# Patient Record
Sex: Male | Born: 1955
Health system: Southern US, Community
[De-identification: ages and names within clinical notes are randomized; demographics above are authoritative.]

## PROBLEM LIST (undated history)

## (undated) DIAGNOSIS — I10 Essential (primary) hypertension: Secondary | ICD-10-CM

## (undated) DIAGNOSIS — M1990 Unspecified osteoarthritis, unspecified site: Secondary | ICD-10-CM

## (undated) DIAGNOSIS — J302 Other seasonal allergic rhinitis: Secondary | ICD-10-CM

## (undated) DIAGNOSIS — E039 Hypothyroidism, unspecified: Secondary | ICD-10-CM

## (undated) DIAGNOSIS — L309 Dermatitis, unspecified: Secondary | ICD-10-CM

## (undated) DIAGNOSIS — C801 Malignant (primary) neoplasm, unspecified: Secondary | ICD-10-CM

## (undated) HISTORY — PX: TONSILLECTOMY: SUR1361

## (undated) HISTORY — DX: Dermatitis, unspecified: L30.9

## (undated) HISTORY — PX: OTHER SURGICAL HISTORY: SHX169

## (undated) HISTORY — PX: THYROIDECTOMY: SHX17

## (undated) HISTORY — PX: ADENOIDECTOMY: SUR15

## (undated) HISTORY — DX: Unspecified osteoarthritis, unspecified site: M19.90

## (undated) HISTORY — DX: Malignant (primary) neoplasm, unspecified: C80.1

## (undated) HISTORY — PX: HERNIA REPAIR: SHX51

---

## 1998-08-08 ENCOUNTER — Ambulatory Visit (HOSPITAL_BASED_OUTPATIENT_CLINIC_OR_DEPARTMENT_OTHER): Admission: RE | Admit: 1998-08-08 | Discharge: 1998-08-08 | Payer: Self-pay | Admitting: General Surgery

## 1999-10-14 ENCOUNTER — Emergency Department (HOSPITAL_COMMUNITY): Admission: EM | Admit: 1999-10-14 | Discharge: 1999-10-14 | Payer: Self-pay | Admitting: Podiatry

## 2001-07-31 ENCOUNTER — Ambulatory Visit (HOSPITAL_COMMUNITY): Admission: RE | Admit: 2001-07-31 | Discharge: 2001-07-31 | Payer: Self-pay | Admitting: Gastroenterology

## 2003-01-25 ENCOUNTER — Ambulatory Visit (HOSPITAL_COMMUNITY): Admission: RE | Admit: 2003-01-25 | Discharge: 2003-01-25 | Payer: Self-pay | Admitting: Family Medicine

## 2005-04-26 ENCOUNTER — Encounter: Admission: RE | Admit: 2005-04-26 | Discharge: 2005-04-26 | Payer: Self-pay | Admitting: Emergency Medicine

## 2005-04-26 ENCOUNTER — Encounter (INDEPENDENT_AMBULATORY_CARE_PROVIDER_SITE_OTHER): Payer: Self-pay | Admitting: Specialist

## 2005-04-26 ENCOUNTER — Other Ambulatory Visit: Admission: RE | Admit: 2005-04-26 | Discharge: 2005-04-26 | Payer: Self-pay | Admitting: Interventional Radiology

## 2005-08-02 ENCOUNTER — Encounter (INDEPENDENT_AMBULATORY_CARE_PROVIDER_SITE_OTHER): Payer: Self-pay | Admitting: *Deleted

## 2005-08-02 ENCOUNTER — Ambulatory Visit (HOSPITAL_COMMUNITY): Admission: RE | Admit: 2005-08-02 | Discharge: 2005-08-03 | Payer: Self-pay | Admitting: Surgery

## 2005-09-23 ENCOUNTER — Ambulatory Visit (HOSPITAL_COMMUNITY): Admission: RE | Admit: 2005-09-23 | Discharge: 2005-09-23 | Payer: Self-pay | Admitting: Surgery

## 2005-10-29 ENCOUNTER — Encounter (INDEPENDENT_AMBULATORY_CARE_PROVIDER_SITE_OTHER): Payer: Self-pay | Admitting: Specialist

## 2005-10-29 ENCOUNTER — Ambulatory Visit (HOSPITAL_COMMUNITY): Admission: RE | Admit: 2005-10-29 | Discharge: 2005-10-30 | Payer: Self-pay | Admitting: Surgery

## 2005-11-24 ENCOUNTER — Emergency Department (HOSPITAL_COMMUNITY): Admission: EM | Admit: 2005-11-24 | Discharge: 2005-11-24 | Payer: Self-pay | Admitting: Family Medicine

## 2006-01-03 ENCOUNTER — Encounter: Admission: RE | Admit: 2006-01-03 | Discharge: 2006-01-03 | Payer: Self-pay | Admitting: Endocrinology

## 2006-01-11 ENCOUNTER — Encounter: Admission: RE | Admit: 2006-01-11 | Discharge: 2006-01-11 | Payer: Self-pay | Admitting: Endocrinology

## 2006-01-24 ENCOUNTER — Emergency Department (HOSPITAL_COMMUNITY): Admission: EM | Admit: 2006-01-24 | Discharge: 2006-01-24 | Payer: Self-pay | Admitting: Family Medicine

## 2006-03-01 ENCOUNTER — Ambulatory Visit (HOSPITAL_COMMUNITY): Admission: RE | Admit: 2006-03-01 | Discharge: 2006-03-01 | Payer: Self-pay | Admitting: Gastroenterology

## 2006-08-13 ENCOUNTER — Emergency Department (HOSPITAL_COMMUNITY): Admission: EM | Admit: 2006-08-13 | Discharge: 2006-08-13 | Payer: Self-pay | Admitting: Family Medicine

## 2007-05-22 ENCOUNTER — Emergency Department (HOSPITAL_COMMUNITY): Admission: EM | Admit: 2007-05-22 | Discharge: 2007-05-22 | Payer: Self-pay | Admitting: Emergency Medicine

## 2010-07-31 NOTE — Procedures (Signed)
[facility]  Patient:    Jerry Freeman, Jerry Freeman Visit Number: [id number] MRN: [id number]          Service Type: END Location: ENDO Attending Physician:  Dennison Bulla Ii Dictated by:   Verlin Grills, M.D. Proc. Date: 07/31/01 Admit Date:  07/31/2001   CC:         Jamesetta Geralds, M.D.   Procedure Report  PROCEDURE:  Screening colonoscopy.  REFERRING PHYSICIAN:  Jamesetta Geralds, M.D.  PROCEDURE INDICATION:  Jerry Freeman is a 55 year old male, born 12/01/1955.  Jerry Freeman father was diagnosed with rectal cancer when his father was in his early 18s.  Jerry Freeman is referred for his first screening colonoscopy with polypectomy to prevent colon cancer.  ENDOSCOPIST:  Verlin Grills, M.D.  PREMEDICATION:  Versed 10 mg, Demerol 50 mg.  ENDOSCOPE:  Olympus pediatric colonoscope.  DESCRIPTION OF PROCEDURE:  After obtaining informed consent, Mr. Jerry Freeman was placed in the left lateral decubitus position.  I administered intravenous Demerol and intravenous Versed to achieve conscious sedation for the procedure.  The patients blood pressure, oxygen saturation, and cardiac rhythm were monitored throughout the procedure and documented in the medical record.  Anal inspection was normal.  Digital rectal exam was normal.  The Olympus pediatric video colonoscope was introduced into the rectum and advanced to the cecum.  A normal-appearing ileocecal valve was intubated and the distal ileum inspected.  Colonic preparation for the exam today was excellent.  RECTUM:  Normal.  SIGMOID COLON AND DESCENDING COLON:  Normal.  SPLENIC FLEXURE:  Normal.  TRANSVERSE COLON:  Normal.  HEPATIC FLEXURE:  Normal.  ASCENDING COLON:  Normal.  CECUM AND ILEOCECAL VALVE:  Normal.  DISTAL ILEUM:  Normal.  ASSESSMENT: 1. Normal screening proctocolonoscopy to the cecum. 2. No endoscopic evidence for the presence of colorectal neoplasia.  RECOMMENDATIONS:  Repeat  colonoscopy in approximately five years. Dictated by:   Verlin Grills, M.D. Attending Physician:  Dennison Bulla Ii DD:  07/31/01 TD:  07/31/01 Job: [id number]

## 2010-07-31 NOTE — Op Note (Signed)
NAME:  Jerry Freeman, Jerry Freeman NO.:  [id number]   MEDICAL RECORD NO.:  [id number]          PATIENT TYPE:  OIB   LOCATION:  1432                         FACILITY:  [facility]   PHYSICIAN:  Thomas A. Cornett, M.D.DATE OF BIRTH:  [birth year]   DATE OF PROCEDURE:  08/02/2005  DATE OF DISCHARGE:                                 OPERATIVE REPORT   PREOPERATIVE DIAGNOSIS:  Right thyroid follicular lesion.   POSTOPERATIVE DIAGNOSIS:  Right thyroid follicular lesion.   PROCEDURE:  Right thyroid lobectomy and isthmusectomy.   SURGEON:  Dr. Harriette Bouillon.   ASSISTANT:  Dr. Consuello Bossier.   ANESTHESIA:  General endotracheal anesthesia.   ESTIMATED BLOOD LOSS:  100 mL.   SPECIMEN:  Right thyroid lobe and isthmus to pathology.  Frozen section  reveals a follicular lesion without obvious capsular invasion.   DRAINS:  None.   INDICATIONS FOR PROCEDURE:  The patient is a 55 year old male with an  enlarging right thyroid nodule.  FNA showed it to be a follicular lesion  with Hurtle cell changes  but it was over 4 cm in maximal diameter.  Given  its large size and his male gender, I recommended a lobectomy for definitive  diagnosis, since these are risk factors.  We also talked about observation,  which in his case, I am uncomfortable doing and recommended excision in this  setting, given its large size.  He agrees and wished to proceed.  The  procedure was discussed, as well as the potential complications.   DESCRIPTION OF PROCEDURE:  The patient was brought to the operating room and  placed supine.  Both arms were tucked and the patient's head was placed in  an extended position after initiation of general endotracheal anesthesia.  The neck was prepped and draped in a sterile fashion.  An incision was made  two fingerbreadths above the sternal notch.  The incision was made through  the skin, down through the platysma muscle.  Superior inferior flaps were  created, thus exposing  the strap muscles.  I divided the strap muscles in  the midline and retracted them to the patient's right.  The patient had a  very large right thyroid lobe with a large nodule in the inferior portion of  it.  We began our dissection on the superior lobe.  I was able to dissect  out the superior thyroid pedicle with the superior thyroid vessels.  These  were dissected and individually tied and clipped.  Care was taken to stay  right on the thyroid gland here.  Once this was done, I used __________ to  push away the soft areolar tissue and take this down toward the inferior  pole.  The lobe was quite difficult to manipulate, given its large size.  There was some bleeding, mostly from the gland itself, during the procedure.  I was able to mobilize the inferior lobe as well.  We were able to dissect  down toward the inferior thyroid vessels and identify these.  These were  tied off with a combination of 3-0 Vicryl ties, as well as clips.  The  inferior thyroid gland was identified and pushed away from the field of  dissection.  The recurrent laryngeal nerve on the right was identified and  care was taken to stay away from this as well.  The superior right  parathyroid gland was not identified.  Once we took the inferior gland, we  were able to take the small vessels with a combination of small clips and  ties.  We then rolled the gland away from the nerve.  The ligament of Allyson Sabal  was then identified.  I placed a Hemostat across this and divided the gland  just above it to expose the trachea.  This was tied off with a 3-0 Vicryl  tie.  There was a very large vein on the inferior portion of the thyroid  gland that I took with 3-0 Vicryl ties.  Once I was able to do this, I was  able to pull the gland and dissect it off the trachea using cautery.  The  isthmus was also taken with it as well.  I then placed a Kelly clamp on the  left side of the isthmus to include the entire isthmus and divided the  gland  with cautery.  I oversewed this with a 3-0 Vicryl stick tie.  The specimen  was sent to pathology.  Frozen section revealed this to be a large  follicular lesion with no obvious signs of capsular invasion.  At this point  in time, since there were no obvious malignant signs, I did not feel a left  lobectomy was warranted.  I palpated the lobe and felt no obvious signs of  disease in his left lobe.  There were no obvious enlarged lymph nodes,  either in the central compartment or on either side that I could feel.  I  reexamined the operative bed.  There was some oozing coming from the  operative bed and I irrigated this out and Surgicel was placed and pressure  was held for 5 minutes.  After this, it was reinspected and hemostasis was  excellent.  I irrigated again and examined the bed very carefully and saw no  signs of bleeding at this point in time.  Clips and ties were on the  appropriate vessels.  The nerve was re-identified and found to be unharmed  upon additional inspection.  I could identify what appeared to be the  inferior parathyroid gland again.  It appeared to be viable.  I did not  identify the superior parathyroid gland though.  At this point in time, the  old Surgicel was removed and a new piece was placed.  I closed the muscles  in the midline using 3-0 Vicryl suture.  I closed the platysma with 3-0  Vicryl pop offs and 4-0 Monocryl was used to close the skin incision.  A  sterile dressing was applied.  All final counts of sponge, needle, and  instruments were found to be correct.  The patient was awoke after placement  of a sterile dressing and taken to recovery in satisfactory condition.      Thomas A. Cornett, M.D.  Electronically Signed     TAC/MEDQ  D:  08/02/2005  T:  08/02/2005  Job:  [id number]   cc:   Jocelyn Lamer D. Pecola Leisure, M.D.  Fax: [phone]   Stan Head. Cleta Alberts, M.D.  Fax: [phone]

## 2010-07-31 NOTE — Op Note (Signed)
NAME:  Jerry Freeman, Jerry Freeman NO.:  [id number]   MEDICAL RECORD NO.:  [id number]          PATIENT TYPE:  AMB   LOCATION:  DAY                          FACILITY:  [facility]   PHYSICIAN:  Thomas A. Cornett, M.D.DATE OF BIRTH:  14-Jul-1955   DATE OF PROCEDURE:  10/29/2005  DATE OF DISCHARGE:                                 OPERATIVE REPORT   PREOPERATIVE DIAGNOSIS:  Follicular carcinoma of the thyroid.   POSTOPERATIVE DIAGNOSIS:  Follicular carcinoma of the thyroid.   PROCEDURES:  1. Completion thyroidectomy.  2. Reimplantation of left inferior parathyroid gland.   SURGEON:  Harriette Bouillon, MD.   ANESTHESIA:  General endotracheal anesthesia.   ASSISTANT:  Dr. Lebron Conners.   DRAINS:  None.   SPECIMEN:  1. Left thyroid lobe and pyramidal lobe.  2. Fragment of left inferior parathyroid verified by frozen section.   INDICATIONS FOR PROCEDURE:  The patient is a 55 year old male who was found  to have a 3.5 cm right thyroid lobe follicular carcinoma Hurthle cell  characteristics on previous right thyroid lobectomy.  He was brought back  for a completion thyroidectomy.  His postoperative course was complicated by  the patient eating prior to his last scheduled surgery and having to  reschedule this again.  He is here today to undergo completion  thyroidectomy.  Procedure discussed as well as potential risks.  He voiced  understanding and he agreed to proceed.   DESCRIPTION OF PROCEDURE:  The patient was brought to the operating room,  placed supine.  Both arms were tucked and his head was extended.  After  general anesthesia, the neck was prepped and draped in sterile fashion.  I  made an incision through his previous scar and dissection was carried down  to identify the scarred remnants of the platysmal muscle.  I was able to  open this and create a superior and inferior flap quite easily given the  amount of scar tissue.  Once I did this, I identified the sternal  hyoid  muscles and was able to transect in between the two muscles in the midline.  I was then able to bluntly dissect the left thyroid lobe away from the  underlying omohyoid and sternohyoid muscles.  Also of note, there is a large  pyramidal lobe that was noted.  Attention was turned to the superior pole.  The superior pole was very high in this patient.  I was able to slowly  dissect the vessels individually off the superior pole taking care to stay  right on the thyroid gland.  This appeared be very difficult and I decided  to go ahead and take the middle thyroid vein to help mobilize the left lobe  up better.  Once I did this, I was able to see better.  We preceded  inferiorly and mobilized the thyroid gland.  Of note, there is a large left  lower pole parathyroid gland that was quite anterior.  This was very high on  the gland and in trying to dissect this away from the gland, we encountered  some bleeding.  I did  not feel this gland was going to be able to be  separated from the inferior pole of the left thyroid lobe very easily and  elected to go ahead in this setting and reimplant it since it was very  difficult dissecting the inferior parathyroid gland away from the thyroid  gland.  I did not think it would be viable by leaving it, so I went ahead  and felt that reimplantation of this would be necessary.  We continued to  mobilize left thyroid lobe further.  I was then able to see the superior  pole better and we took these vessels individually between clips and 3-0  silk ties.  Once we were able to flip this up, I was able to stay on the  thyroid gland and continue to dissect the thyroid gland away from the  tracheoesophageal groove.  We identified the left recurrent laryngeal nerve  and preserve this.  Inferior thyroid vessels were also taken individually  between clips and silk ties of 3-0 caliber.  Once I was able to roll the  gland away from the tracheoesophageal groove, I  used the cautery and small  clips to dissect it off the anterior surface of the trachea.  There was a  very large pyramidal lobe and I was able to dissect up and remove the  majority of this, but I left about a 1 cm segment of the pyramidal lobe  behind and cauterized it.  I was able to then remove the remainder of the  thyroid off the anterior surface of the trachea and sent to pathology.  The  parathyroid gland was then removed from the inferior pole and I sent a small  portion for frozen section which verified it was parathyroid tissue.  The  remainder was placed in cold saline.  I then took the parathyroid gland and  cut it into very small cubes on a Telfa and then reimplanted it in a small  pocket I made in the left sternohyoid muscle and then closed with a Vicryl  stitch.  The superior parathyroid gland was not identified and was not with  the specimen.  At this point, irrigation was used and suctioned out.  There  was a small amount of oozing just anterior to the nerve on the left and I  was able to place a small clip across this without clipping the nerve.  The  remainder of the operative wound was hemostatic.  After irrigation, I dried  this out of placed Surgicel on the surgical bed.  I then closed the muscle  layers using interrupted 3-0 Vicryl pop-offs.  I closed the platysma with 3-  0 Vicryl pop-offs and the skin with 4-0 Monocryl.  All final counts of  sponge, needle and instruments were found to be correct at this portion of  the case.  Sterile dressings were applied.  The patient was awoke and taken  to recovery in satisfactory condition.      Thomas A. Cornett, M.D.  Electronically Signed     TAC/MEDQ  D:  10/29/2005  T:  10/29/2005  Job:  [id number]   cc:   Brett Canales A. Cleta Alberts, M.D.  Fax: [phone]   Betti D. Pecola Leisure, M.D.  Fax: [phone]

## 2011-03-13 ENCOUNTER — Encounter: Payer: Self-pay | Admitting: *Deleted

## 2011-03-13 ENCOUNTER — Emergency Department (INDEPENDENT_AMBULATORY_CARE_PROVIDER_SITE_OTHER)
Admission: EM | Admit: 2011-03-13 | Discharge: 2011-03-13 | Disposition: A | Discharge status: Home / Self Care | Payer: BC Managed Care – PPO | Source: Home / Self Care | Attending: Family Medicine | Admitting: Family Medicine

## 2011-03-13 DIAGNOSIS — R05 Cough: Secondary | ICD-10-CM

## 2011-03-13 HISTORY — DX: Other seasonal allergic rhinitis: J30.2

## 2011-03-13 HISTORY — DX: Hypothyroidism, unspecified: E03.9

## 2011-03-13 MED ORDER — ACETAMINOPHEN-CODEINE 120-12 MG/5ML PO SUSP: 5.0000 mL | Freq: Four times a day (QID) | ORAL | Status: AC | PRN

## 2011-03-13 MED ORDER — OMEPRAZOLE 20 MG PO CPDR: 20.0000 mg | DELAYED_RELEASE_CAPSULE | Freq: Every day | ORAL | Status: DC

## 2011-03-13 MED ORDER — FEXOFENADINE-PSEUDOEPHED ER 60-120 MG PO TB12: 1.0000 | ORAL_TABLET | Freq: Two times a day (BID) | ORAL | Status: DC

## 2011-03-13 MED ORDER — PREDNISONE 20 MG PO TABS: ORAL_TABLET | ORAL | Status: AC

## 2011-03-13 NOTE — ED Provider Notes (Signed)
History     CSN: [id number]  Arrival date & time 03/13/11  9147   First MD Initiated Contact with Patient 03/13/11 [phone]      Chief Complaint  Patient presents with  . Cough  . Nasal Congestion  . Nausea    (Consider location/radiation/quality/duration/timing/severity/associated sxs/prior treatment) HPI Comments: 55 y/o male h/o hypothyroidism post thyroidectomy here c/o persistent non productive cough, altough some times might see clear sputum only in am after waking up from bed. Reports had cold like symptoms that started about 10 days ago, sore throat and congestion resolved but has persistent cough that makes him nauseous some times. Denies chest pain or shortness of breath or wheezing. No fever or chills. Has taken cough syrup with no improvement. No vomiting or diarrhea, no headache or dizziness. No PND. No leg swelling. Non smoker, denies acid reflux.    Past Medical History  Diagnosis Date  . Hypothyroidism   . Seasonal allergies     Past Surgical History  Procedure Date  . Hernia repair   . Thyroidectomy     History reviewed. No pertinent family history.  History  Substance Use Topics  . Smoking status: Never Smoker   . Smokeless tobacco: Not on file  . Alcohol Use: No      Review of Systems  Constitutional: Negative.  Negative for fever, chills, appetite change and fatigue.  HENT: Negative for congestion, sore throat, rhinorrhea, trouble swallowing, neck pain, voice change and sinus pressure.   Respiratory: Positive for cough. Negative for chest tightness, shortness of breath and wheezing.   Cardiovascular: Negative for chest pain, palpitations and leg swelling.  Skin: Negative for rash.    Allergies  Review of patient's allergies indicates no known allergies.  Home Medications   Current Outpatient Rx  Name Route Sig Dispense Refill  . CETIRIZINE HCL 10 MG PO TABS Oral Take 10 mg by mouth daily.      . GUAIFENESIN ER 600 MG PO TB12 Oral Take 1,200  mg by mouth 2 (two) times daily.      Marland Kitchen LEVOTHYROXINE SODIUM 125 MCG PO TABS Oral Take 125 mcg by mouth daily.      Marland Kitchen VITAMIN C 500 MG PO TABS Oral Take 500 mg by mouth daily.      . ACETAMINOPHEN-CODEINE 120-12 MG/5ML PO SUSP Oral Take 5 mLs by mouth every 6 (six) hours as needed for pain (or cough). 60 mL 0  . OMEPRAZOLE 20 MG PO CPDR Oral Take 1 capsule (20 mg total) by mouth daily. 30 capsule 0  . PREDNISONE 20 MG PO TABS  2 tabs po daily for 5 days 10 tablet no    BP 138/94  Pulse 61  Temp(Src) 97.6 F (36.4 C) (Oral)  Resp 15  SpO2 98%  Physical Exam  Nursing note and vitals reviewed. Constitutional: He is oriented to person, place, and time. He appears well-developed and well-nourished. No distress.  HENT:  Head: Normocephalic and atraumatic.  Right Ear: Tympanic membrane normal.  Left Ear: Tympanic membrane normal.  Nose: Mucosal edema present. No rhinorrhea. Right sinus exhibits no maxillary sinus tenderness and no frontal sinus tenderness. Left sinus exhibits no maxillary sinus tenderness and no frontal sinus tenderness.  Mouth/Throat: Uvula is midline, oropharynx is clear and moist and mucous membranes are normal. No oropharyngeal exudate.  Eyes: Conjunctivae and EOM are normal. Pupils are equal, round, and reactive to light. Right eye exhibits no discharge. Left eye exhibits no discharge.  Neck: Neck supple. No  JVD present.  Cardiovascular: Normal rate and regular rhythm.  Exam reveals no gallop and no friction rub.   No murmur heard. Pulmonary/Chest: Breath sounds normal. No respiratory distress. He has no wheezes. He has no rales. He exhibits no tenderness.  Abdominal: Soft. He exhibits no distension. There is no tenderness.  Lymphadenopathy:    He has no cervical adenopathy.  Neurological: He is alert and oriented to person, place, and time.  Skin: No rash noted.    ED Course  Procedures (including critical care time)  Labs Reviewed - No data to display No  results found.   1. Persistent cough       MDM  Impress residual cough after recent URI. Clinically well afebrile. Treated with antihistamine,codein, prednisone and Prilosec. Supportive measures discussed. Asked to follow up with PCP or return if persistent or worsening or new symptoms.        Sharin Grave, MD 03/14/11 [phone]

## 2011-03-13 NOTE — ED Notes (Signed)
Pt with onset of cough/congestion 12/16 taking otc alka seltzer cold syrup without relief onset of nausea this am

## 2011-06-28 ENCOUNTER — Telehealth: Payer: Self-pay

## 2011-06-28 NOTE — Telephone Encounter (Signed)
Pt is requesting a refill on viagra, states can't come in until next month for an evaluation.  Please call pt to advise if can refill

## 2011-06-30 MED ORDER — SILDENAFIL CITRATE 50 MG PO TABS: 50.0000 mg | ORAL_TABLET | Freq: Every day | ORAL | Status: DC | PRN

## 2011-06-30 NOTE — Telephone Encounter (Signed)
Chart pulled to PA 

## 2011-06-30 NOTE — Telephone Encounter (Signed)
Rx for #10 sent to [facility].  Please advise patient that he needs OV for additional fills.

## 2011-06-30 NOTE — Telephone Encounter (Signed)
Please pull chart. Not yet seen in epic.  Jerry Freeman

## 2011-06-30 NOTE — Telephone Encounter (Signed)
Order placed in chart but do not have pharamacy listed, please get pt pharmacy to send.

## 2011-06-30 NOTE — Telephone Encounter (Signed)
Patient uses [facility] and would like rx called into there.

## 2011-06-30 NOTE — Telephone Encounter (Signed)
Called pt LMOM to cb with pharmacy

## 2011-06-30 NOTE — Telephone Encounter (Signed)
I LET PT KNOW THAT RX'S SENT TO [facility] AND HE WILL NEED OV FOR MORE.

## 2011-08-16 ENCOUNTER — Ambulatory Visit (INDEPENDENT_AMBULATORY_CARE_PROVIDER_SITE_OTHER): Payer: BC Managed Care – PPO | Admitting: Internal Medicine

## 2011-08-16 ENCOUNTER — Encounter: Payer: Self-pay | Admitting: Internal Medicine

## 2011-08-16 VITALS — BP 121/87 | HR 79 | Temp 97.0°F | Resp 18 | Ht 73.0 in | Wt 207.0 lb

## 2011-08-16 DIAGNOSIS — J301 Allergic rhinitis due to pollen: Secondary | ICD-10-CM

## 2011-08-16 DIAGNOSIS — E039 Hypothyroidism, unspecified: Secondary | ICD-10-CM

## 2011-08-16 DIAGNOSIS — N529 Male erectile dysfunction, unspecified: Secondary | ICD-10-CM

## 2011-08-16 DIAGNOSIS — Z789 Other specified health status: Secondary | ICD-10-CM

## 2011-08-16 MED ORDER — CETIRIZINE HCL 10 MG PO TABS: 10.0000 mg | ORAL_TABLET | Freq: Every day | ORAL | Status: DC

## 2011-08-16 MED ORDER — LEVOTHYROXINE SODIUM 125 MCG PO TABS: 125.0000 ug | ORAL_TABLET | Freq: Every day | ORAL | Status: DC

## 2011-08-16 MED ORDER — SILDENAFIL CITRATE 100 MG PO TABS: 100.0000 mg | ORAL_TABLET | Freq: Every day | ORAL | Status: DC | PRN

## 2011-08-16 NOTE — Progress Notes (Signed)
  Subjective:    Patient ID: Jerry Freeman, male    DOB: 03-24-1955, 56 y.o.   MRN: [id number]  HPI Low thy rxed ED needs viagra Allergys controlled   Review of Systems     Objective:   Physical Exam  Normal      Assessment & Plan:  RF meds 1 year

## 2011-08-17 LAB — TSH: TSH: 2.4 u[IU]/mL (ref 0.350–4.500)

## 2011-08-19 ENCOUNTER — Telehealth: Payer: Self-pay

## 2011-08-19 NOTE — Telephone Encounter (Signed)
Spoke to patient to advise his labs for thyroid were good and results were mailed to him already Jerry Freeman

## 2011-08-19 NOTE — Telephone Encounter (Signed)
Saw Dr. Perrin Maltese on Monday, wanting to know if lab work is back re: thyroid.  Pt has a prescripion at [facility], but need to know if the synthroid needs to be adjusted based on labwork.

## 2011-10-15 ENCOUNTER — Encounter: Payer: Self-pay | Admitting: Physician Assistant

## 2011-12-06 ENCOUNTER — Encounter: Payer: BC Managed Care – PPO | Admitting: Internal Medicine

## 2011-12-08 ENCOUNTER — Ambulatory Visit (INDEPENDENT_AMBULATORY_CARE_PROVIDER_SITE_OTHER): Payer: BC Managed Care – PPO | Admitting: Family Medicine

## 2011-12-08 ENCOUNTER — Encounter: Payer: Self-pay | Admitting: Family Medicine

## 2011-12-08 VITALS — BP 130/94 | HR 48 | Temp 97.7°F | Resp 16 | Ht 72.5 in | Wt 215.0 lb

## 2011-12-08 DIAGNOSIS — Z23 Encounter for immunization: Secondary | ICD-10-CM

## 2011-12-08 DIAGNOSIS — Z0000 Encounter for general adult medical examination without abnormal findings: Secondary | ICD-10-CM

## 2011-12-08 DIAGNOSIS — K219 Gastro-esophageal reflux disease without esophagitis: Secondary | ICD-10-CM

## 2011-12-08 DIAGNOSIS — J309 Allergic rhinitis, unspecified: Secondary | ICD-10-CM

## 2011-12-08 LAB — POCT URINALYSIS DIPSTICK
Bilirubin, UA: NEGATIVE
Blood, UA: NEGATIVE
Glucose, UA: NEGATIVE
Ketones, UA: NEGATIVE
Leukocytes, UA: NEGATIVE
Nitrite, UA: NEGATIVE
Protein, UA: NEGATIVE
Spec Grav, UA: 1.025
Urobilinogen, UA: 0.2
pH, UA: 5.5

## 2011-12-08 LAB — BASIC METABOLIC PANEL
BUN: 15 mg/dL (ref 6–23)
CO2: 28 mEq/L (ref 19–32)
Calcium: 9 mg/dL (ref 8.4–10.5)
Chloride: 105 mEq/L (ref 96–112)
Creat: 1.23 mg/dL (ref 0.50–1.35)
Glucose, Bld: 81 mg/dL (ref 70–99)
Potassium: 4.4 mEq/L (ref 3.5–5.3)
Sodium: 140 mEq/L (ref 135–145)

## 2011-12-08 MED ORDER — OMEPRAZOLE 20 MG PO CPDR: 20.0000 mg | DELAYED_RELEASE_CAPSULE | Freq: Every day | ORAL | Status: DC

## 2011-12-08 NOTE — Progress Notes (Signed)
Subjective:    Patient ID: Jerry Freeman, male    DOB: 11-14-1955, 56 y.o.   MRN: [id number]  HPI  This 56 y.o. AA male is here for CPE. Chronic medical problems include Hypothyroidism and A.R. 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 He also had CPE on Nov 12, 2010.      Hypothyroidism (s/p thyroidectomy for thyroid cancer) has been evaluated by Dr. Leslie Dales. Pt has also seen Dr. Elnoria Howard (GI) for CRS earlier this year. Today, he c/o cough and throat clearing, thought to be due to GERD.He has not taken any OTC product consistently for these symptoms.      Pt reports having Shingles in 2007.      Pt is married; denies use of all substances. Exercises regularly at a fitness facility.   Review of Systems  Constitutional: Negative.   HENT: Positive for tinnitus.   Eyes: Positive for itching.  Respiratory: Positive for cough.   Cardiovascular: Negative.   Gastrointestinal: Negative.   Genitourinary: Negative.   Musculoskeletal: Negative.   Skin: Negative.   Neurological: Negative.   Hematological: Negative.   Psychiatric/Behavioral: Negative.        Objective:   Physical Exam  Nursing note and vitals reviewed. Constitutional: He is oriented to person, place, and time. He appears well-developed and well-nourished. No distress.  HENT:  Head: Normocephalic and atraumatic.  Right Ear: Hearing, tympanic membrane, external ear and ear canal normal.  Left Ear: Hearing, tympanic membrane, external ear and ear canal normal.  Nose: No mucosal edema, rhinorrhea, nasal deformity or septal deviation. Right sinus exhibits no maxillary sinus tenderness and no frontal sinus tenderness. Left sinus exhibits no maxillary sinus tenderness and no frontal sinus tenderness.  Mouth/Throat: Uvula is midline and mucous membranes are normal. No oral lesions. Normal dentition. Posterior oropharyngeal erythema present. No oropharyngeal exudate.   Eyes: EOM and lids are normal. Pupils are equal, round, and reactive to light. Right conjunctiva is injected. Right conjunctiva has no hemorrhage. Left conjunctiva is injected. Left conjunctiva has no hemorrhage. No scleral icterus.  Neck: Normal range of motion. Neck supple. No JVD present.  Cardiovascular: Normal rate, regular rhythm, normal heart sounds and intact distal pulses.  Exam reveals no gallop and no friction rub.   No murmur heard. Pulmonary/Chest: Effort normal and breath sounds normal. No respiratory distress.  Abdominal: Soft. Normal appearance. He exhibits no abdominal bruit, no pulsatile midline mass and no mass. Bowel sounds are decreased. There is no hepatosplenomegaly. There is no tenderness. There is no guarding and no CVA tenderness. No hernia.  Genitourinary: Rectum normal, prostate normal and penis normal.  Musculoskeletal: Normal range of motion. He exhibits no edema and no tenderness.  Lymphadenopathy:    He has no cervical adenopathy.  Neurological: He is alert and oriented to person, place, and time. He has normal reflexes. No cranial nerve deficit. He exhibits normal muscle tone. Coordination normal.  Skin: Skin is warm and dry.  Psychiatric: He has a normal mood and affect. His behavior is normal. Judgment and thought content normal.        Assessment & Plan:   1. Annual physical exam  Basic metabolic panel, PSA, POCT urinalysis dipstick, Microalbumin, urine  2. Allergic rhinitis, cause unspecified  Continue Cetrizine 10 mg hs  3. GERD (gastroesophageal reflux disease)  Continue Omeprazole daily; diet modifications advised  4. Need for influenza vaccination  Flu vaccine greater than or equal to  3yo preservative free IM

## 2011-12-08 NOTE — Patient Instructions (Addendum)
Keeping you healthy  Get these tests  Blood pressure- Have your blood pressure checked once a year by your healthcare provider.  Normal blood pressure is 120/80  Weight- Have your body mass index (BMI) calculated to screen for obesity.  BMI is a measure of body fat based on height and weight. You can also calculate your own BMI at [email or web address].  Cholesterol- Have your cholesterol checked every year.  Diabetes- Have your blood sugar checked regularly if you have high blood pressure, high cholesterol, have a family history of diabetes or if you are overweight.  Screening for Colon Cancer- Colonoscopy starting at age 17.  Screening may begin sooner depending on your family history and other health conditions. Follow up colonoscopy as directed by your Gastroenterologist.  Screening for Prostate Cancer- Both blood work (PSA) and a rectal exam help screen for Prostate Cancer.  Screening begins at age 32 with African-American men and at age 4 with Caucasian men.  Screening may begin sooner depending on your family history.  Take these medicines  Aspirin- One aspirin daily can help prevent Heart disease and Stroke.  Flu shot- Every fall.  Tetanus- Every 10 years.  Zostavax- Once after the age of 30 to prevent Shingles. You  Have been given  RX: Zostavax. Take this to [facility] or [facility] to receive vaccine.  Pneumonia shot- Once after the age of 31; if you are younger than 23, ask your healthcare provider if you need a Pneumonia shot.  Take these steps  Don't smoke- If you do smoke, talk to your doctor about quitting.  For tips on how to quit, go to www.smokefree.gov or call 1-800-QUIT-NOW.  Be physically active- Exercise 5 days a week for at least 30 minutes.  If you are not already physically active start slow and gradually work up to 30 minutes of moderate physical activity.  Examples of moderate activity include walking briskly, mowing the yard, dancing, swimming,  bicycling, etc.  Eat a healthy diet- Eat a variety of healthy food such as fruits, vegetables, low fat milk, low fat cheese, yogurt, lean meant, poultry, fish, beans, tofu, etc. For more information go to www.thenutritionsource.org  Drink alcohol in moderation- Limit alcohol intake to less than two drinks a day. Never drink and drive.  Dentist- Brush and floss twice daily; visit your dentist twice a year.  Depression- Your emotional health is as important as your physical health. If you're feeling down, or losing interest in things you would normally enjoy please talk to your healthcare provider.  Eye exam- Visit your eye doctor every year.  Safe sex- If you may be exposed to a sexually transmitted infection, use a condom.  Seat belts- Seat belts can save your life; always wear one.  Smoke/Carbon Monoxide detectors- These detectors need to be installed on the appropriate level of your home.  Replace batteries at least once a year.  Skin cancer- When out in the sun, cover up and use sunscreen 15 SPF or higher.  Violence- If anyone is threatening you, please tell your healthcare provider.  Living Will/ Health care power of attorney- Speak with your healthcare provider and family.    Diet for GERD or PUD Nutrition therapy can help ease the discomfort of gastroesophageal reflux disease (GERD) and peptic ulcer disease (PUD).  HOME CARE INSTRUCTIONS   Eat your meals slowly, in a relaxed setting.   Eat 5 to 6 small meals per day.   If a food causes distress, stop eating it for  a period of time.  FOODS TO AVOID  Coffee, regular or decaffeinated.   Cola beverages, regular or low calorie.   Tea, regular or decaffeinated.   Pepper.   Cocoa.   High fat foods, including meats.   Butter, margarine, hydrogenated oil (trans fats).   Peppermint or spearmint (if you have GERD).   Fruits and vegetables if not tolerated.   Alcohol.   Nicotine (smoking or chewing). This is one of  the most potent stimulants to acid production in the gastrointestinal tract.   Any food that seems to aggravate your condition.  If you have questions regarding your diet, ask your caregiver or a registered dietitian. TIPS  Lying flat may make symptoms worse. Keep the head of your bed raised 6 to 9 inches (15 to 23 cm) by using a foam wedge or blocks under the legs of the bed.   Do not lay down until 3 hours after eating a meal.   Daily physical activity may help reduce symptoms.  MAKE SURE YOU:   Understand these instructions.   Will watch your condition.   Will get help right away if you are not doing well or get worse.  Document Released: 03/01/2005 Document Revised: 02/18/2011 Document Reviewed: 01/15/2011 [facility] Patient Information 2012 [place], [place].

## 2011-12-09 LAB — PSA: PSA: 0.51 ng/mL (ref <=4.00)

## 2011-12-09 LAB — MICROALBUMIN, URINE: Microalb, Ur: 0.5 mg/dL (ref 0.00–1.89)

## 2011-12-10 NOTE — Progress Notes (Signed)
Quick Note:  Please notify pt that results are normal.   Provide pt with copy of labs. ______ 

## 2011-12-11 DIAGNOSIS — J309 Allergic rhinitis, unspecified: Secondary | ICD-10-CM | POA: Insufficient documentation

## 2011-12-11 DIAGNOSIS — J3089 Other allergic rhinitis: Secondary | ICD-10-CM | POA: Insufficient documentation

## 2011-12-11 DIAGNOSIS — C73 Malignant neoplasm of thyroid gland: Secondary | ICD-10-CM | POA: Insufficient documentation

## 2011-12-11 DIAGNOSIS — Z800 Family history of malignant neoplasm of digestive organs: Secondary | ICD-10-CM | POA: Insufficient documentation

## 2011-12-12 ENCOUNTER — Encounter: Payer: Self-pay | Admitting: *Deleted

## 2012-02-09 ENCOUNTER — Other Ambulatory Visit: Payer: Self-pay | Admitting: Physician Assistant

## 2012-02-09 MED ORDER — MELOXICAM 15 MG PO TABS: 15.0000 mg | ORAL_TABLET | Freq: Every day | ORAL | Status: DC

## 2012-02-09 NOTE — Progress Notes (Signed)
Patient calls with plantar fascitis pain.  Tried wife's meloxicam and it worked well. Pt. Would like rx.

## 2012-07-26 ENCOUNTER — Ambulatory Visit (INDEPENDENT_AMBULATORY_CARE_PROVIDER_SITE_OTHER): Payer: BC Managed Care – PPO | Admitting: Family Medicine

## 2012-07-26 ENCOUNTER — Encounter: Payer: Self-pay | Admitting: Family Medicine

## 2012-07-26 VITALS — BP 126/88 | HR 59 | Temp 97.5°F | Resp 16 | Ht 72.5 in | Wt 208.0 lb

## 2012-07-26 DIAGNOSIS — R69 Illness, unspecified: Secondary | ICD-10-CM

## 2012-07-26 DIAGNOSIS — J309 Allergic rhinitis, unspecified: Secondary | ICD-10-CM

## 2012-07-26 DIAGNOSIS — M5136 Other intervertebral disc degeneration, lumbar region: Secondary | ICD-10-CM

## 2012-07-26 DIAGNOSIS — M5137 Other intervertebral disc degeneration, lumbosacral region: Secondary | ICD-10-CM

## 2012-07-26 DIAGNOSIS — E039 Hypothyroidism, unspecified: Secondary | ICD-10-CM

## 2012-07-26 LAB — COMPREHENSIVE METABOLIC PANEL
ALT: 23 U/L (ref 0–53)
AST: 30 U/L (ref 0–37)
Albumin: 4.1 g/dL (ref 3.5–5.2)
Alkaline Phosphatase: 47 U/L (ref 39–117)
BUN: 15 mg/dL (ref 6–23)
CO2: 28 mEq/L (ref 19–32)
Calcium: 9.2 mg/dL (ref 8.4–10.5)
Chloride: 106 mEq/L (ref 96–112)
Creat: 1.17 mg/dL (ref 0.50–1.35)
Glucose, Bld: 87 mg/dL (ref 70–99)
Potassium: 4.3 mEq/L (ref 3.5–5.3)
Sodium: 140 mEq/L (ref 135–145)
Total Bilirubin: 1.3 mg/dL — ABNORMAL HIGH (ref 0.3–1.2)
Total Protein: 6.3 g/dL (ref 6.0–8.3)

## 2012-07-26 LAB — TSH: TSH: 1.751 u[IU]/mL (ref 0.350–4.500)

## 2012-07-26 LAB — T3, FREE: T3, Free: 2.5 pg/mL (ref 2.3–4.2)

## 2012-07-26 LAB — LDL CHOLESTEROL, DIRECT: Direct LDL: 77 mg/dL

## 2012-07-26 LAB — T4, FREE: Free T4: 1.17 ng/dL (ref 0.80–1.80)

## 2012-07-26 MED ORDER — MELOXICAM 15 MG PO TABS: 15.0000 mg | ORAL_TABLET | Freq: Every day | ORAL | Status: DC

## 2012-07-26 MED ORDER — CETIRIZINE HCL 10 MG PO TABS: 10.0000 mg | ORAL_TABLET | Freq: Every day | ORAL | Status: DC

## 2012-07-26 MED ORDER — OMEPRAZOLE 20 MG PO CPDR: 20.0000 mg | DELAYED_RELEASE_CAPSULE | Freq: Every day | ORAL | Status: DC

## 2012-07-26 MED ORDER — LEVOTHYROXINE SODIUM 125 MCG PO TABS: 125.0000 ug | ORAL_TABLET | Freq: Every day | ORAL | Status: DC

## 2012-07-26 NOTE — Patient Instructions (Addendum)

## 2012-07-26 NOTE — Progress Notes (Signed)
S: This 57 y.o. AA male c/o 2- month hx of LBP; he has AM stiffness and a hx of sciatica. In the past, he has had radiation of pain down L leg. He has taken Advil with pain relief. Rates pain as "4". Pt works out everyday and has been athletic in his younger days. 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 Review of paper chart shows xrays of lumbar spine done Oct  2010; these show DDD ans spondylosis. Pt does not recall this diagnosis or discussion about this w/ examining physician. 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  Pt has a thyroid disorder and needs routine labs and medication refills. He denies any fatigue, fever/ chills, abnormal weight gain, CP or palpitations, SOB or DOE, swelling , myalgias, skin changes, GI problems, HA, dizziness, weakness, tremor, syncope, agitation or behavior changes, dysphoric mood, concentration difficulties or sleep disturbance.   Patient Active Problem List   Diagnosis Date Noted  . Allergic rhinitis 12/11/2011  . Family history of colon cancer 12/11/2011  . Cancer of thyroid 12/11/2011  . Hypothyroidism 08/16/2011  . ED (erectile dysfunction) 08/16/2011    PMHx, Soc Hx and Fam Hx reviewed.  ROS: As per HPI.  O: Filed Vitals:   07/26/12 0955  BP: 126/88  Pulse: 59  Temp: 97.5 F (36.4 C)  Resp: 16   GEN: In NAD; WN,WD HENT: [place]/AT; EOMI w/ clear conj and slightly muddy  Sclerae. EACs/nose/oroph clear. COR: RRR. LUNGS: Normal resp rate and effort. BACK: Spine is straight. No point tenderness over spinous processes or SI joints. Forward flexion to >60 degrees. SLR neg. Pt balance on each leg. MS: MAEs; no c/c/e. No muscle atrophy. No deformities. NEURO: A&O x 3; CNs intact. Pt can walk on toes and heels w/o difficulty. DTRs difficult to illicit in UEs; 1-2+ in LEs. Gait is normal.  A/P: DDD (degenerative disc disease), lumbar (see xray report scanned into EMR).   Continue Meloxicam 15 mg 1 tab daily w/ meal or snack. Continue Omeprazole daily.  Hypothyroid - Plan: TSH, T3, Free, T4, Free, LDL Cholesterol, Direct, levothyroxine (SYNTHROID, LEVOTHROID) 125 MCG tablet  Taking multiple medications for chronic disease - Plan: Comprehensive metabolic panel   Meds ordered this encounter  Medications  . cetirizine (ZYRTEC) 10 MG tablet    Sig: Take 1 tablet (10 mg total) by mouth daily.    Dispense:  90 tablet    Refill:  3  . levothyroxine (SYNTHROID, LEVOTHROID) 125 MCG tablet    Sig: Take 1 tablet (125 mcg total) by mouth daily.    Dispense:  90 tablet    Refill:  3  . meloxicam (MOBIC) 15 MG tablet    Sig: Take 1 tablet (15 mg total) by mouth daily. X 2weeks then prn pain    Dispense:  90 tablet    Refill:  2            . omeprazole (PRILOSEC) 20 MG capsule    Sig: Take 1 capsule (20 mg total) by mouth daily.    Dispense:  90 capsule    Refill:  3   RXs faxed to [facility].

## 2012-07-27 ENCOUNTER — Encounter: Payer: Self-pay | Admitting: Family Medicine

## 2012-07-30 ENCOUNTER — Encounter: Payer: Self-pay | Admitting: *Deleted

## 2012-10-09 ENCOUNTER — Other Ambulatory Visit: Payer: Self-pay | Admitting: Internal Medicine

## 2012-10-10 NOTE — Telephone Encounter (Signed)
Dr Audria Nine, it looks like you last saw this pt for check-up. Do you want to RF the Viagra?

## 2012-10-11 NOTE — Telephone Encounter (Signed)
Viagra script will be axed to [facility].

## 2013-01-18 ENCOUNTER — Other Ambulatory Visit: Payer: Self-pay

## 2013-03-13 ENCOUNTER — Ambulatory Visit (INDEPENDENT_AMBULATORY_CARE_PROVIDER_SITE_OTHER): Payer: BC Managed Care – PPO | Admitting: Family Medicine

## 2013-03-13 VITALS — BP 140/84 | HR 58 | Temp 97.6°F | Resp 16 | Ht 72.5 in | Wt 211.0 lb

## 2013-03-13 DIAGNOSIS — K219 Gastro-esophageal reflux disease without esophagitis: Secondary | ICD-10-CM

## 2013-03-13 DIAGNOSIS — M545 Low back pain, unspecified: Secondary | ICD-10-CM

## 2013-03-13 DIAGNOSIS — Z0000 Encounter for general adult medical examination without abnormal findings: Secondary | ICD-10-CM

## 2013-03-13 DIAGNOSIS — Z23 Encounter for immunization: Secondary | ICD-10-CM

## 2013-03-13 DIAGNOSIS — E039 Hypothyroidism, unspecified: Secondary | ICD-10-CM

## 2013-03-13 LAB — POCT URINALYSIS DIPSTICK
Bilirubin, UA: NEGATIVE
Blood, UA: NEGATIVE
Ketones, UA: NEGATIVE
Leukocytes, UA: NEGATIVE
Protein, UA: NEGATIVE
Spec Grav, UA: 1.025

## 2013-03-13 LAB — POCT UA - MICROSCOPIC ONLY
Epithelial cells, urine per micros: NEGATIVE
Mucus, UA: POSITIVE

## 2013-03-13 LAB — POCT CBC
Hemoglobin: 14.4 g/dL (ref 14.1–18.1)
Lymph, poc: 1.5 (ref 0.6–3.4)
MCH, POC: 27.4 pg (ref 27–31.2)
MCHC: 31.5 g/dL — AB (ref 31.8–35.4)
MCV: 86.9 fL (ref 80–97)
MID (cbc): 0.4 (ref 0–0.9)
MPV: 10.6 fL (ref 0–99.8)
POC Granulocyte: 2.2 (ref 2–6.9)
POC MID %: 10.3 %M (ref 0–12)
Platelet Count, POC: 169 10*3/uL (ref 142–424)
RDW, POC: 15.1 %
WBC: 4.1 10*3/uL — AB (ref 4.6–10.2)

## 2013-03-13 MED ORDER — OMEPRAZOLE 20 MG PO CPDR: 20.0000 mg | DELAYED_RELEASE_CAPSULE | Freq: Every day | ORAL | Status: DC

## 2013-03-13 MED ORDER — LEVOTHYROXINE SODIUM 125 MCG PO TABS: 125.0000 ug | ORAL_TABLET | Freq: Every day | ORAL | Status: DC

## 2013-03-13 NOTE — Progress Notes (Signed)
Physical examination  History: Patient is here for an annual physical examination. 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 He has a remote history of thyroid cancer, no longer an issue. He also has a history of GERD-induced cough for which he takes omeprazole. He feels well except for problems with his back which gave him a lot of trouble in the year but is doing better the last few months. He has been doing exercises for it.  Past medical history: Surgeries: Thyroidectomy Medical illnesses: Thyroid cancer, GERD Allergies: None Medications: Synthroid, omeprazole  Family history: Both parents had cancers are deceased. Nose with lung cancer, father bowel cancer. Neither got proper care early in the illness.  Social history: Married. Children are grown and gone. Gets a lot of physical exercise. Has 2 been working too much to play much golf lately. 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 He attends church regularly  Review assessment: Constitutional: Unremarkable HEENT: Unremarkable Respiratory: Unremarkable Cardiovascular: Unremarkable Gastrointestinal: Unremarkable Genitourinary: Urine stream is slowing down some. No nocturia. Musculoskeletal: Unremarkable except for low back pain as mentioned above Dermatologic: Unremarkable Neurologic: Unremarkable Psychiatric: Unremarkable Endocrine: Takes his thyroid medicine faithfully  Physical examination: Well-developed well-nourished physically fit appearing man in no major distress. TMs are normal. Eyes PERRLA. Fundi benign. Throat clear. Neck supple without nodes or thyromegaly. No carotid bruits. Chest is clear to auscultation. Heart regular without murmurs gallops or arrhythmias. Abdomen soft without organomegaly mass or tenderness. Normal male external genitalia with testes descended. No hernias. He is circumcised. Extremities are unremarkable. No ankle edema. Skin warm and dry. Digital  rectal exam revealed prostate gland to be normal. It is difficulty to reach the top portion of the gland however.  Assessment: Annual physical examination Hypothyroidism History of thyroid cancer GERD Urinary frequency Low back pain, chronic intermittent  Plan: PSA, chemistries, lipids, A1c, TSH, UA, stool for occult blood Results for orders placed in visit on 03/13/13  POCT CBC      Result Value Range   WBC 4.1 (*) 4.6 - 10.2 K/uL   Lymph, poc 1.5  0.6 - 3.4   POC LYMPH PERCENT 36.9  10 - 50 %L   MID (cbc) 0.4  0 - 0.9   POC MID % 10.3  0 - 12 %M   POC Granulocyte 2.2  2 - 6.9   Granulocyte percent 52.8  37 - 80 %G   RBC 5.26  4.69 - 6.13 M/uL   Hemoglobin 14.4  14.1 - 18.1 g/dL   HCT, POC 16.1  09.6 - 53.7 %   MCV 86.9  80 - 97 fL   MCH, POC 27.4  27 - 31.2 pg   MCHC 31.5 (*) 31.8 - 35.4 g/dL   RDW, POC 04.5     Platelet Count, POC 169  142 - 424 K/uL   MPV 10.6  0 - 99.8 fL  IFOBT (OCCULT BLOOD)      Result Value Range   IFOBT Negative    POCT GLYCOSYLATED HEMOGLOBIN (HGB A1C)      Result Value Range   Hemoglobin A1C 5.3    POCT URINALYSIS DIPSTICK      Result Value Range   Color, UA yellow     Clarity, UA clear     Glucose, UA neg     Bilirubin, UA neg     Ketones, UA neg     Spec Grav, UA 1.025  Blood, UA neg     pH, UA 5.5     Protein, UA neg     Urobilinogen, UA 0.2     Nitrite, UA neg     Leukocytes, UA Negative    POCT UA - MICROSCOPIC ONLY      Result Value Range   WBC, Ur, HPF, POC 0-2     RBC, urine, microscopic 2-4     Bacteria, U Microscopic 1+     Mucus, UA positive     Epithelial cells, urine per micros neg     Crystals, Ur, HPF, POC neg     Casts, Ur, LPF, POC neg     Yeast, UA eng     Flu shot

## 2013-03-13 NOTE — Patient Instructions (Signed)
Continue current medications. I will let you know whether we need to change the thyroid dose or not. I will know the results of your additional labs.  And get exercise and stretching to keep your back loose.

## 2013-03-14 LAB — LIPID PANEL
HDL: 72 mg/dL (ref >39)
LDL Cholesterol: 111 mg/dL — ABNORMAL HIGH (ref 0–99)
Total CHOL/HDL Ratio: 2.8 Ratio
VLDL: 15 mg/dL (ref 0–40)

## 2013-03-14 LAB — COMPREHENSIVE METABOLIC PANEL
ALT: 29 U/L (ref 0–53)
AST: 33 U/L (ref 0–37)
Alkaline Phosphatase: 53 U/L (ref 39–117)
BUN: 18 mg/dL (ref 6–23)
Calcium: 8.8 mg/dL (ref 8.4–10.5)
Chloride: 100 mEq/L (ref 96–112)
Creat: 1.02 mg/dL (ref 0.50–1.35)
Glucose, Bld: 88 mg/dL (ref 70–99)
Sodium: 134 mEq/L — ABNORMAL LOW (ref 135–145)
Total Bilirubin: 1 mg/dL (ref 0.3–1.2)

## 2013-03-14 LAB — TSH: TSH: 7.203 u[IU]/mL — ABNORMAL HIGH (ref 0.350–4.500)

## 2013-03-14 LAB — PSA: PSA: 0.56 ng/mL (ref <=4.00)

## 2013-03-27 ENCOUNTER — Telehealth: Payer: Self-pay

## 2013-03-27 MED ORDER — LEVOTHYROXINE SODIUM 137 MCG PO TABS: 137.0000 ug | ORAL_TABLET | Freq: Every day | ORAL | Status: DC

## 2013-03-27 NOTE — Telephone Encounter (Signed)
Pt called to check on new Rx for inc levothyroxine. See note on labs from Dr Linna Darner. Advised pt that it looks like it did not get sent at time of lab and I am sending new Rx now. Pt stated he will continue the current dose until arrives.

## 2013-09-05 ENCOUNTER — Other Ambulatory Visit: Payer: Self-pay | Admitting: Family Medicine

## 2013-11-20 ENCOUNTER — Ambulatory Visit (INDEPENDENT_AMBULATORY_CARE_PROVIDER_SITE_OTHER): Payer: BC Managed Care – PPO | Admitting: Family Medicine

## 2013-11-20 ENCOUNTER — Encounter: Payer: Self-pay | Admitting: Family Medicine

## 2013-11-20 VITALS — BP 140/76 | HR 76 | Temp 98.0°F | Resp 16 | Wt 209.0 lb

## 2013-11-20 DIAGNOSIS — M5137 Other intervertebral disc degeneration, lumbosacral region: Secondary | ICD-10-CM

## 2013-11-20 DIAGNOSIS — J309 Allergic rhinitis, unspecified: Secondary | ICD-10-CM

## 2013-11-20 DIAGNOSIS — E890 Postprocedural hypothyroidism: Secondary | ICD-10-CM

## 2013-11-20 DIAGNOSIS — M5136 Other intervertebral disc degeneration, lumbar region: Secondary | ICD-10-CM

## 2013-11-20 DIAGNOSIS — Z1159 Encounter for screening for other viral diseases: Secondary | ICD-10-CM

## 2013-11-20 MED ORDER — MELOXICAM 15 MG PO TABS: 15.0000 mg | ORAL_TABLET | Freq: Every day | ORAL | Status: DC

## 2013-11-20 MED ORDER — CETIRIZINE HCL 10 MG PO TABS: 10.0000 mg | ORAL_TABLET | Freq: Every day | ORAL | Status: DC

## 2013-11-20 NOTE — Patient Instructions (Signed)
Mediterranean Diet  Why follow it? Research shows.   Those who follow the Mediterranean diet have a reduced risk of heart disease    The diet is associated with a reduced incidence of Parkinson's and Alzheimer's diseases   People following the diet may have longer life expectancies and lower rates of chronic diseases    The Dietary Guidelines for Americans recommends the Mediterranean diet as an eating plan to promote health and prevent disease  What Is the Mediterranean Diet?    Healthy eating plan based on typical foods and recipes of Mediterranean-style cooking   The diet is primarily a plant based diet; these foods should make up a majority of meals   Starches - Plant based foods should make up a majority of meals - They are an important sources of vitamins, minerals, energy, antioxidants, and fiber - Choose whole grains, foods high in fiber and minimally processed items  - Typical grain sources include wheat, oats, barley, corn, brown rice, bulgar, farro, millet, polenta, couscous  - Various types of beans include chickpeas, lentils, fava beans, black beans, white beans   Fruits  Veggies - Large quantities of antioxidant rich fruits & veggies; 6 or more servings  - Vegetables can be eaten raw or lightly drizzled with oil and cooked  - Vegetables common to the traditional Mediterranean Diet include: artichokes, arugula, beets, broccoli, brussel sprouts, cabbage, carrots, celery, collard greens, cucumbers, eggplant, kale, leeks, lemons, lettuce, mushrooms, okra, onions, peas, peppers, potatoes, pumpkin, radishes, rutabaga, shallots, spinach, sweet potatoes, turnips, zucchini - Fruits common to the Mediterranean Diet include: apples, apricots, avocados, cherries, clementines, dates, figs, grapefruits, grapes, melons, nectarines, oranges, peaches, pears, pomegranates, strawberries, tangerines  Fats - Replace butter and margarine with healthy oils, such as olive oil, canola oil, and tahini   - Limit nuts to no more than a handful a day  - Nuts include walnuts, almonds, pecans, pistachios, pine nuts  - Limit or avoid candied, honey roasted or heavily salted nuts - Olives are central to the Mediterranean diet - can be eaten whole or used in a variety of dishes   Meats Protein - Limiting red meat: no more than a few times a month - When eating red meat: choose lean cuts and keep the portion to the size of deck of cards - Eggs: approx. 0 to 4 times a week  - Fish and lean poultry: at least 2 a week  - Healthy protein sources include, chicken, turkey, lean beef, lamb - Increase intake of seafood such as tuna, salmon, trout, mackerel, shrimp, scallops - Avoid or limit high fat processed meats such as sausage and bacon  Dairy - Include moderate amounts of low fat dairy products  - Focus on healthy dairy such as fat free yogurt, skim milk, low or reduced fat cheese - Limit dairy products higher in fat such as whole or 2% milk, cheese, ice cream  Alcohol - Moderate amounts of red wine is ok  - No more than 5 oz daily for women (all ages) and men older than age 65  - No more than 10 oz of wine daily for men younger than 65  Other - Limit sweets and other desserts  - Use herbs and spices instead of salt to flavor foods  - Herbs and spices common to the traditional Mediterranean Diet include: basil, bay leaves, chives, cloves, cumin, fennel, garlic, lavender, marjoram, mint, oregano, parsley, pepper, rosemary, sage, savory, sumac, tarragon, thyme   It's not just a diet,   it's a lifestyle:    The Mediterranean diet includes lifestyle factors typical of those in the region    Foods, drinks and meals are best eaten with others and savored   Daily physical activity is important for overall good health   This could be strenuous exercise like running and aerobics   This could also be more leisurely activities such as walking, housework, yard-work, or taking the stairs   Moderation is the key;  a balanced and healthy diet accommodates most foods and drinks   Consider portion sizes and frequency of consumption of certain foods   Meal Ideas & Options:    Breakfast:  o Whole wheat toast or whole wheat English muffins with peanut butter & hard boiled egg o Steel cut oats topped with apples & cinnamon and skim milk  o Fresh fruit: banana, strawberries, melon, berries, peaches  o Smoothies: strawberries, bananas, greek yogurt, peanut butter o Low fat greek yogurt with blueberries and granola  o Egg white omelet with spinach and mushrooms o Breakfast couscous: whole wheat couscous, apricots, skim milk, cranberries    Sandwiches:  o Hummus and grilled vegetables (peppers, zucchini, squash) on whole wheat bread   o Grilled chicken on whole wheat pita with lettuce, tomatoes, cucumbers or tzatziki  o Tuna salad on whole wheat bread: tuna salad made with greek yogurt, olives, red peppers, capers, green onions o Garlic rosemary lamb pita: lamb sauted with garlic, rosemary, salt & pepper; add lettuce, cucumber, greek yogurt to pita - flavor with lemon juice and black pepper    Seafood:  o Mediterranean grilled salmon, seasoned with garlic, basil, parsley, lemon juice and black pepper o Shrimp, lemon, and spinach whole-grain pasta salad made with low fat greek yogurt  o Seared scallops with lemon orzo  o Seared tuna steaks seasoned salt, pepper, coriander topped with tomato mixture of olives, tomatoes, olive oil, minced garlic, parsley, green onions and cappers    Meats:  o Herbed greek chicken salad with kalamata olives, cucumber, feta  o Red bell peppers stuffed with spinach, bulgur, lean ground beef (or lentils) & topped with feta   o Kebabs: skewers of chicken, tomatoes, onions, zucchini, squash  o [place] burgers: made with red onions, mint, dill, lemon juice, feta cheese topped with roasted red peppers   Vegetarian o Cucumber salad: cucumbers, artichoke hearts, celery, red onion, feta  cheese, tossed in olive oil & lemon juice  o Hummus and whole grain pita points with a greek salad (lettuce, tomato, feta, olives, cucumbers, red onion) o Lentil soup with celery, carrots made with vegetable broth, garlic, salt and pepper  o Tabouli salad: parsley, bulgur, mint, scallions, cucumbers, tomato, radishes, lemon juice, olive oil, salt and pepper.    FISH OIL supplement may be helpful for reducing joint and low back pain. I recommend you try MegaRed Krill Oil (see the sample given).

## 2013-11-20 NOTE — Progress Notes (Signed)
Subjective:    Patient ID: Jerry Freeman, male    DOB: 11/17/1955, 58 y.o.   MRN: [id number]  Urinary Frequency  Associated symptoms include frequency.    This 58 y.o. AA male has allergic rhinitis, treated w/ Cetirizine 10 mg daily. He has been out of medication for > 1 week and noticed hoarseness, mild cough and rhinorrhea. He also has mild reflux, relieved w/ Omeprazole.   Pt is s/p thyroidectomy for treatment of thyroid cancer; he was advised that he may have some hoarseness after this procedure. He is compliant w/ medication. He denies fatigue, abnormal weight change, diaphoresis, vision changes, difficulty swallowing, palpitations, GI problems, rash, HA, dizziness, weakness or tremor.  Pt has intermittent LBP with paresthesias involving both legs. He was evaluated at [facility] last year and xray showed degenerative changes. No other treatment was prescribed other than NSAIDs and stretching (pt does yoga). He has problems when sleeping on back. Denies prolonged pain, numbness, gait problems or pain interfering w/ sleep. Meloxicam is effective for discomfort.  Pt reports some mild increased urinary frequency with increased caffeine intake, esp on empty stomach. Nocturia x 1 most nights. Noted some hesitancy but this has resolved with use of saw palmetto. Last DRE in Dec 2014 was normal (prostate); PSA = 0.56.   Patient Active Problem List   Diagnosis Date Noted  . DDD (degenerative disc disease), lumbar 11/20/2013  . Allergic rhinitis 12/11/2011  . Family history of colon cancer 12/11/2011  . Cancer of thyroid 12/11/2011  . Hypothyroidism 08/16/2011  . ED (erectile dysfunction) 08/16/2011    Prior to Admission medications   Medication Sig Start Date End Date Taking? Authorizing Provider  calcium carbonate 1250 MG capsule Take 1,250 mg by mouth 2 (two) times daily with a meal. Per pt it is citracal plus D   Yes Historical Provider, MD  cetirizine (ZYRTEC) 10 MG tablet Take 1 tablet  (10 mg total) by mouth daily.   Yes Barton Fanny, MD  levothyroxine (SYNTHROID, LEVOTHROID) 137 MCG tablet Take 1 tablet (137 mcg total) by mouth daily before breakfast. 03/27/13  Yes Posey Boyer, MD  meloxicam (MOBIC) 15 MG tablet Take 1 tablet (15 mg total) by mouth daily. X 2weeks then prn pain   Yes Barton Fanny, MD  Multiple Vitamins-Minerals (MEGA MULTI MEN PO) Take by mouth daily. [place] 50 PLUS   Yes Historical Provider, MD  omeprazole (PRILOSEC) 20 MG capsule Take 1 capsule (20 mg total) by mouth daily. 03/13/13 03/13/14 Yes Posey Boyer, MD  saw palmetto 160 MG capsule Take 160 mg by mouth daily.   Yes Historical Provider, MD  VIAGRA 100 MG tablet TAKE 1 TABLET DAILY AS NEEDED FOR ERECTILE DYSFUNCTION 10/09/12  Yes Barton Fanny, MD  vitamin C (ASCORBIC ACID) 500 MG tablet Take 500 mg by mouth daily.    Yes Historical Provider, MD   History   Social History  . Marital Status: Married    Spouse Name: N/A    Number of Children: N/A  . Years of Education: N/A   Occupational History  . rail roader    Social History Main Topics  . Smoking status: Former Smoker -- 18 years    Types: Cigarettes    Quit date: 12/15/1991  . Smokeless tobacco: Not on file     Comment: also smoked marijuana,and cocaine (free-base)   . Alcohol Use: No  . Drug Use: No  . Sexual Activity: Not on file   Other  Topics Concern  . Not on file   Social History Narrative   Exercise cardio and weights 4 days/wk for 1 hour    Family History  Problem Relation Age of Onset  . Cancer Mother     lung  . Cancer Father     rectal  . Kidney disease Maternal Grandmother     dialysis  . Hypertension Maternal Grandmother   . Arthritis Maternal Grandfather     Review of Systems  Constitutional: Negative.   HENT: Positive for congestion, postnasal drip, rhinorrhea and voice change. Negative for sinus pressure, sore throat and trouble swallowing.   Eyes: Negative.   Respiratory:  Negative.   Cardiovascular: Negative.   Endocrine: Negative.   Genitourinary: Positive for frequency.  Musculoskeletal: Negative.       Objective:   Physical Exam  Nursing note and vitals reviewed. Constitutional: He is oriented to person, place, and time. Vital signs are normal. He appears well-developed and well-nourished. No distress.  HENT:  Head: Normocephalic and atraumatic.  Right Ear: Hearing, tympanic membrane, external ear and ear canal normal.  Left Ear: Hearing, tympanic membrane, external ear and ear canal normal.  Nose: Nose normal. No nasal deformity or septal deviation. Right sinus exhibits no maxillary sinus tenderness and no frontal sinus tenderness. Left sinus exhibits no maxillary sinus tenderness and no frontal sinus tenderness.  Mouth/Throat: Uvula is midline, oropharynx is clear and moist and mucous membranes are normal. No oral lesions. No uvula swelling.  Eyes: Conjunctivae and EOM are normal. Pupils are equal, round, and reactive to light. No scleral icterus.  Neck: Normal range of motion. Neck supple.  Cardiovascular: Normal rate, regular rhythm and normal heart sounds.   Pulmonary/Chest: Effort normal and breath sounds normal. No respiratory distress.  Musculoskeletal: Normal range of motion. He exhibits no edema and no tenderness.  Lymphadenopathy:    He has no cervical adenopathy.  Neurological: He is alert and oriented to person, place, and time. No cranial nerve deficit. He exhibits normal muscle tone.  Skin: Skin is warm and dry. He is not diaphoretic.  Psychiatric: He has a normal mood and affect. His behavior is normal. Judgment and thought content normal.   Reviewed notes from [place] visit dated 09/26/2012; visit w/ Dr. Melina Schools.     Assessment & Plan:  Allergic rhinitis, unspecified allergic rhinitis type- Continue Cetirizine and OTC AYR nasal mist.  Postoperative hypothyroidism - Continue Levothyroxine 137 mcg 1 tab daily pending results.     Plan: Thyroid Panel With TSH  DDD (degenerative disc disease), lumbar - Continue Meloxicam. Plan: Vitamin D, 25-hydroxy  Need for hepatitis C screening test - Plan: Hepatitis C antibody

## 2013-11-21 LAB — VITAMIN D 25 HYDROXY (VIT D DEFICIENCY, FRACTURES): Vit D, 25-Hydroxy: 42 ng/mL (ref 30–89)

## 2013-11-21 LAB — HEPATITIS C ANTIBODY: HCV AB: NEGATIVE

## 2013-11-21 LAB — THYROID PANEL WITH TSH
Free Thyroxine Index: 1.5 (ref 1.4–3.8)
T3 Uptake: 28 % (ref 22.0–35.0)
T4, Total: 5.4 ug/dL (ref 4.5–12.0)
TSH: 7.914 u[IU]/mL — ABNORMAL HIGH (ref 0.350–4.500)

## 2013-11-22 ENCOUNTER — Other Ambulatory Visit: Payer: Self-pay | Admitting: Family Medicine

## 2013-11-22 MED ORDER — LEVOTHYROXINE SODIUM 150 MCG PO TABS: 150.0000 ug | ORAL_TABLET | Freq: Every day | ORAL | Status: DC

## 2014-03-13 ENCOUNTER — Encounter: Payer: Self-pay | Admitting: Family Medicine

## 2014-03-13 ENCOUNTER — Ambulatory Visit (INDEPENDENT_AMBULATORY_CARE_PROVIDER_SITE_OTHER): Payer: BC Managed Care – PPO | Admitting: Family Medicine

## 2014-03-13 VITALS — BP 138/78 | HR 66 | Temp 98.1°F | Resp 18 | Ht 72.5 in | Wt 202.0 lb

## 2014-03-13 DIAGNOSIS — Z23 Encounter for immunization: Secondary | ICD-10-CM

## 2014-03-13 DIAGNOSIS — Z13220 Encounter for screening for lipoid disorders: Secondary | ICD-10-CM

## 2014-03-13 DIAGNOSIS — E039 Hypothyroidism, unspecified: Secondary | ICD-10-CM

## 2014-03-13 DIAGNOSIS — Z0000 Encounter for general adult medical examination without abnormal findings: Secondary | ICD-10-CM

## 2014-03-13 DIAGNOSIS — K219 Gastro-esophageal reflux disease without esophagitis: Secondary | ICD-10-CM

## 2014-03-13 DIAGNOSIS — R350 Frequency of micturition: Secondary | ICD-10-CM

## 2014-03-13 LAB — POCT URINALYSIS DIPSTICK
Bilirubin, UA: NEGATIVE
Glucose, UA: NEGATIVE
KETONES UA: NEGATIVE
Leukocytes, UA: NEGATIVE
Nitrite, UA: NEGATIVE
PH UA: 7
Protein, UA: NEGATIVE
RBC UA: NEGATIVE
SPEC GRAV UA: 1.015
UROBILINOGEN UA: 0.2

## 2014-03-13 LAB — COMPLETE METABOLIC PANEL WITH GFR
ALBUMIN: 4.3 g/dL (ref 3.5–5.2)
ALK PHOS: 65 U/L (ref 39–117)
ALT: 27 U/L (ref 0–53)
AST: 30 U/L (ref 0–37)
BILIRUBIN TOTAL: 1.5 mg/dL — AB (ref 0.2–1.2)
BUN: 12 mg/dL (ref 6–23)
CO2: 30 mEq/L (ref 19–32)
Calcium: 9.4 mg/dL (ref 8.4–10.5)
Chloride: 101 mEq/L (ref 96–112)
Creat: 1.19 mg/dL (ref 0.50–1.35)
GFR, EST AFRICAN AMERICAN: 76 mL/min
GFR, EST NON AFRICAN AMERICAN: 66 mL/min
GLUCOSE: 81 mg/dL (ref 70–99)
Potassium: 4.4 mEq/L (ref 3.5–5.3)
SODIUM: 138 meq/L (ref 135–145)
Total Protein: 7 g/dL (ref 6.0–8.3)

## 2014-03-13 LAB — LIPID PANEL
Cholesterol: 178 mg/dL (ref 0–200)
HDL: 67 mg/dL (ref >39)
LDL CALC: 95 mg/dL (ref 0–99)
Total CHOL/HDL Ratio: 2.7 Ratio
Triglycerides: 80 mg/dL (ref <150)
VLDL: 16 mg/dL (ref 0–40)

## 2014-03-13 MED ORDER — SILDENAFIL CITRATE 100 MG PO TABS: ORAL_TABLET | ORAL | Status: DC

## 2014-03-13 MED ORDER — ZOSTER VACCINE LIVE 19400 UNT/0.65ML ~~LOC~~ SOLR: 0.6500 mL | Freq: Once | SUBCUTANEOUS | Status: DC

## 2014-03-13 MED ORDER — OMEPRAZOLE 20 MG PO CPDR: 20.0000 mg | DELAYED_RELEASE_CAPSULE | Freq: Every day | ORAL | Status: DC

## 2014-03-13 NOTE — Patient Instructions (Signed)
Keeping you healthy  Get these tests  Blood pressure- Have your blood pressure checked once a year by your healthcare provider.  Normal blood pressure is 120/80  Weight- Have your body mass index (BMI) calculated to screen for obesity.  BMI is a measure of body fat based on height and weight. You can also calculate your own BMI at ViewBanking.si.  Cholesterol- Have your cholesterol checked every year.  Diabetes- Have your blood sugar checked regularly if you have high blood pressure, high cholesterol, have a family history of diabetes or if you are overweight.  Screening for Colon Cancer- Colonoscopy starting at age 40.  Screening may begin sooner depending on your family history and other health conditions. Follow up colonoscopy as directed by your Gastroenterologist.  Screening for Prostate Cancer- Both blood work (PSA) and a rectal exam help screen for Prostate Cancer.  Screening begins at age 40 with African-American men and at age 41 with Caucasian men.  Screening may begin sooner depending on your family history. Once I have the PSA value, I can determine if you need to see a urologist.  Take these medicines  Aspirin- One aspirin daily can help prevent Heart disease and Stroke.  Flu shot- Every fall.  Tetanus- Every 10 years.  Zostavax- Once after the age of 23 to prevent Shingles.  Pneumonia shot- Once after the age of 36; if you are younger than 58, ask your healthcare provider if you need a Pneumonia shot. Today, you received [id number].   Take these steps  Don't smoke- If you do smoke, talk to your doctor about quitting.  For tips on how to quit, go to www.smokefree.gov or call 1-800-QUIT-NOW.  Be physically active- Exercise 5 days a week for at least 30 minutes.  If you are not already physically active start slow and gradually work up to 30 minutes of moderate physical activity.  Examples of moderate activity include walking briskly, mowing the yard, dancing,  swimming, bicycling, etc.  Eat a healthy diet- Eat a variety of healthy food such as fruits, vegetables, low fat milk, low fat cheese, yogurt, lean meant, poultry, fish, beans, tofu, etc. For more information go to www.thenutritionsource.org  Drink alcohol in moderation- Limit alcohol intake to less than two drinks a day. Never drink and drive.  Dentist- Brush and floss twice daily; visit your dentist twice a year.  Depression- Your emotional health is as important as your physical health. If you're feeling down, or losing interest in things you would normally enjoy please talk to your healthcare provider.  Eye exam- Visit your eye doctor every year.  Safe sex- If you may be exposed to a sexually transmitted infection, use a condom.  Seat belts- Seat belts can save your life; always wear one.  Smoke/Carbon Monoxide detectors- These detectors need to be installed on the appropriate level of your home.  Replace batteries at least once a year.  Skin cancer- When out in the sun, cover up and use sunscreen 15 SPF or higher.  Violence- If anyone is threatening you, please tell your healthcare provider.  Living Will/ Health care power of attorney- Speak with your healthcare provider and family.

## 2014-03-13 NOTE — Progress Notes (Signed)
Subjective:    Patient ID: Jerry Freeman, male    DOB: [birth year], 58 y.o.   MRN: [id number]  HPI  This 58 y.o. Male is here for annual health maintenance exam. He has hx of thyroid cancer, status post total thyroidectomy, currently taking replacement medication. He has chronic mild hoarseness as a result but no difficulty swallowing. He is compliant w/ medications w/o adverse effects.  HCM: IMM- Needs pneumonia vaccine (gives hx of pneumonia) and Shingles vaccine.           CRS- Current (April 2013 w/ 5-yr recall).           PSA- Prostate exam last year >> normal; has mild LUTS.           Vision- Annually.   Patient Active Problem List   Diagnosis Date Noted  . DDD (degenerative disc disease), lumbar 11/20/2013  . Allergic rhinitis 12/11/2011  . Family history of colon cancer 12/11/2011  . Cancer of thyroid 12/11/2011  . Hypothyroidism 08/16/2011  . ED (erectile dysfunction) 08/16/2011   Prior to Admission medications   Medication Sig Start Date End Date Taking? Authorizing Provider  calcium carbonate 1250 MG capsule Take 1,250 mg by mouth 2 (two) times daily with a meal. Per pt it is citracal plus D   Yes Historical Provider, MD  cetirizine (ZYRTEC) 10 MG tablet Take 1 tablet (10 mg total) by mouth daily. 11/20/13  Yes Barton Fanny, MD  Javier Docker Oil (MAXIMUM RED KRILL PO) Take by mouth.   Yes Historical Provider, MD  levothyroxine (SYNTHROID, LEVOTHROID) 150 MCG tablet Take 1 tablet (150 mcg total) by mouth daily. 11/22/13  Yes Barton Fanny, MD  meloxicam (MOBIC) 15 MG tablet Take 1 tablet (15 mg total) by mouth daily. X 2weeks then prn pain 11/20/13  Yes Barton Fanny, MD  Multiple Vitamins-Minerals (MEGA MULTI MEN PO) Take by mouth daily. [place] 50 PLUS   Yes Historical Provider, MD  omeprazole (PRILOSEC) 20 MG capsule Take 1 capsule (20 mg total) by mouth daily.   Yes Barton Fanny, MD  saw palmetto 160 MG capsule Take 160 mg by mouth daily.   Yes Historical  Provider, MD  sildenafil (VIAGRA) 100 MG tablet TAKE 1 TABLET DAILY AS NEEDED FOR ERECTILE DYSFUNCTION   Yes Barton Fanny, MD  vitamin C (ASCORBIC ACID) 500 MG tablet Take 500 mg by mouth daily.    Yes Historical Provider, MD    History   Social History  . Marital Status: Married    Spouse Name: N/A    Number of Children: N/A  . Years of Education: N/A   Occupational History  . rail roader    Social History Main Topics  . Smoking status: Former Smoker -- 18 years    Types: Cigarettes    Quit date: 12/15/1991  . Smokeless tobacco: Not on file     Comment: also smoked marijuana,and cocaine (free-base)   . Alcohol Use: No  . Drug Use: No  . Sexual Activity: Not on file   Other Topics Concern  . Not on file   Social History Narrative   Exercise cardio and weights 4 days/wk for 1 hour    Family History  Problem Relation Age of Onset  . Cancer Mother     lung  . Cancer Father     rectal  . Kidney disease Maternal Grandmother     dialysis  . Hypertension Maternal Grandmother   . Arthritis Maternal Grandfather  Review of Systems  Constitutional: Positive for diaphoresis.  HENT: Positive for voice change.   Eyes: Positive for visual disturbance.       Wears corrective lenses  Respiratory: Negative.   Cardiovascular: Negative.   Gastrointestinal: Negative for nausea, vomiting, abdominal pain, diarrhea, constipation, blood in stool and abdominal distention.       GERD; diet modification helps reduce symptoms.   Endocrine:       Thyroid disease- s/p thyroidectomy.  Genitourinary: Positive for frequency.  Allergic/Immunologic: Negative.   Neurological: Negative.   Hematological: Negative.   Psychiatric/Behavioral: Negative.       Objective:   Physical Exam  Constitutional: He is oriented to person, place, and time. Vital signs are normal. He appears well-developed and well-nourished. No distress.  HENT:  Head: Normocephalic and atraumatic.  Right Ear:  Hearing, tympanic membrane, external ear and ear canal normal.  Left Ear: Hearing, tympanic membrane, external ear and ear canal normal.  Nose: Nose normal. No mucosal edema, rhinorrhea, nasal deformity or septal deviation.  Mouth/Throat: Uvula is midline, oropharynx is clear and moist and mucous membranes are normal. No oral lesions. Normal dentition.  Eyes: Conjunctivae, EOM and lids are normal. Pupils are equal, round, and reactive to light. No scleral icterus.  Neck: Trachea normal, normal range of motion, full passive range of motion without pain and phonation normal. Neck supple. No JVD present. No spinous process tenderness and no muscular tenderness present. Carotid bruit is not present.  Well healed anterior thyroidectomy scar.  Cardiovascular: Normal rate, regular rhythm, S1 normal, S2 normal, normal heart sounds, intact distal pulses and normal pulses.   No extrasystoles are present. PMI is not displaced.  Exam reveals no gallop and no friction rub.   No murmur heard. Pulmonary/Chest: Effort normal and breath sounds normal. No respiratory distress. He has no decreased breath sounds. He has no wheezes. He has no rhonchi.  Abdominal: Soft. Normal appearance and bowel sounds are normal. He exhibits no distension, no abdominal bruit, no pulsatile midline mass and no mass. There is no hepatosplenomegaly. There is no tenderness. There is no guarding and no CVA tenderness.  Genitourinary: Rectum normal and prostate normal. Rectal exam shows no external hemorrhoid, no fissure, no mass, no tenderness and anal tone normal.  Difficult to reach main body of prostate gland.  Musculoskeletal:       Right shoulder: Normal.       Left shoulder: Normal.       Right hip: Normal.       Left hip: Normal.       Right knee: Normal.       Left knee: Normal.       Cervical back: Normal.       Thoracic back: Normal.       Lumbar back: He exhibits tenderness and spasm. He exhibits no bony tenderness, no  swelling, no deformity and no pain.  Remainder of exam unremarkable.  Lymphadenopathy:       Head (right side): No submental, no submandibular, no tonsillar, no preauricular, no posterior auricular and no occipital adenopathy present.       Head (left side): No submental, no submandibular, no tonsillar, no preauricular, no posterior auricular and no occipital adenopathy present.    He has no cervical adenopathy.       Right: No inguinal and no supraclavicular adenopathy present.       Left: No inguinal and no supraclavicular adenopathy present.  Neurological: He is alert and oriented to person, place,  and time. He has normal strength and normal reflexes. He displays no atrophy and no tremor. No cranial nerve deficit or sensory deficit. He exhibits normal muscle tone. Coordination and gait normal.  Skin: Skin is warm, dry and intact. No ecchymosis, no lesion and no rash noted. He is not diaphoretic. No cyanosis. Nails show no clubbing.  Psychiatric: He has a normal mood and affect. His speech is normal and behavior is normal. Judgment and thought content normal. Cognition and memory are normal.  Nursing note and vitals reviewed.   Results for orders placed or performed in visit on 03/13/14  POCT urinalysis dipstick  Result Value Ref Range   Color, UA Light Yellow    Clarity, UA Clear    Glucose, UA Negative    Bilirubin, UA Negative    Ketones, UA Negative    Spec Grav, UA 1.015    Blood, UA Negative    pH, UA 7.0    Protein, UA Negative    Urobilinogen, UA 0.2    Nitrite, UA Negative    Leukocytes, UA Negative       Assessment & Plan:  Annual physical exam  Hypothyroidism, unspecified hypothyroidism type - Continue current medication pending lab results. Plan: Thyroid Panel With TSH, COMPLETE METABOLIC PANEL WITH GFR  Urinary frequency - Plan: POCT urinalysis dipstick, PSA  Gastroesophageal reflux disease, esophagitis presence not specified - Plan: omeprazole (PRILOSEC) 20 MG  capsule  Screening for hyperlipidemia - Plan: Lipid panel, COMPLETE METABOLIC PANEL WITH GFR   Meds ordered this encounter  Medications  . zoster vaccine live, PF, (ZOSTAVAX) 17915 UNT/0.65ML injection    Sig: Inject 19,400 Units into the skin once.    Dispense:  1 each    Refill:  0    Sig: Take 1 capsule (20 mg total) by mouth daily.    Dispense:  90 capsule    Refill:  3  . sildenafil (VIAGRA) 100 MG tablet    Sig: TAKE 1 TABLET DAILY AS NEEDED FOR ERECTILE DYSFUNCTION    Dispense:  22 tablet    Refill:  3

## 2014-03-14 ENCOUNTER — Telehealth: Payer: Self-pay | Admitting: Family Medicine

## 2014-03-14 ENCOUNTER — Encounter: Payer: Self-pay | Admitting: Family Medicine

## 2014-03-14 DIAGNOSIS — E890 Postprocedural hypothyroidism: Secondary | ICD-10-CM

## 2014-03-14 LAB — THYROID PANEL WITH TSH
FREE THYROXINE INDEX: 1.9 (ref 1.4–3.8)
T3 UPTAKE: 24 % (ref 22–35)
T4 TOTAL: 8 ug/dL (ref 4.5–12.0)
TSH: 0.333 u[IU]/mL — ABNORMAL LOW (ref 0.350–4.500)

## 2014-03-14 MED ORDER — LEVOTHYROXINE SODIUM 125 MCG PO TABS: 125.0000 ug | ORAL_TABLET | Freq: Every day | ORAL | Status: DC

## 2014-03-14 NOTE — Telephone Encounter (Signed)
Phone pt to discuss lab results. Levothyroxine dose to lowered to 125 mcg per day. Other labs unremarkable. PSA pending. He asked about  Urology referral but I advised that I am waiting on PSA value. Pt to come in for TSH lab in ~ 8 weeks; he states he will walk-in at 102. Lab has been ordered as "future" test.

## 2014-10-28 ENCOUNTER — Ambulatory Visit: Payer: BLUE CROSS/BLUE SHIELD

## 2014-11-19 ENCOUNTER — Encounter: Payer: Self-pay | Admitting: Physician Assistant

## 2014-11-19 ENCOUNTER — Ambulatory Visit (INDEPENDENT_AMBULATORY_CARE_PROVIDER_SITE_OTHER): Payer: BLUE CROSS/BLUE SHIELD | Admitting: Physician Assistant

## 2014-11-19 VITALS — BP 156/98 | HR 60 | Temp 98.4°F | Resp 16 | Ht 73.0 in | Wt 211.0 lb

## 2014-11-19 DIAGNOSIS — Z23 Encounter for immunization: Secondary | ICD-10-CM | POA: Diagnosis not present

## 2014-11-19 DIAGNOSIS — E039 Hypothyroidism, unspecified: Secondary | ICD-10-CM

## 2014-11-19 DIAGNOSIS — I10 Essential (primary) hypertension: Secondary | ICD-10-CM | POA: Diagnosis not present

## 2014-11-19 LAB — COMPLETE METABOLIC PANEL WITH GFR
ALBUMIN: 3.9 g/dL (ref 3.6–5.1)
ALK PHOS: 64 U/L (ref 40–115)
ALT: 18 U/L (ref 9–46)
AST: 25 U/L (ref 10–35)
BILIRUBIN TOTAL: 1.2 mg/dL (ref 0.2–1.2)
BUN: 14 mg/dL (ref 7–25)
CO2: 29 mmol/L (ref 20–31)
Calcium: 9 mg/dL (ref 8.6–10.3)
Chloride: 103 mmol/L (ref 98–110)
Creat: 1.03 mg/dL (ref 0.70–1.25)
GFR, EST NON AFRICAN AMERICAN: 78 mL/min (ref >=60)
Glucose, Bld: 76 mg/dL (ref 65–99)
Potassium: 4.3 mmol/L (ref 3.5–5.3)
SODIUM: 140 mmol/L (ref 135–146)
TOTAL PROTEIN: 6.7 g/dL (ref 6.1–8.1)

## 2014-11-19 LAB — LIPID PANEL
Cholesterol: 172 mg/dL (ref 125–200)
HDL: 57 mg/dL (ref >=40)
LDL Cholesterol: 101 mg/dL (ref <130)
TRIGLYCERIDES: 71 mg/dL (ref <150)
Total CHOL/HDL Ratio: 3 Ratio (ref <=5.0)
VLDL: 14 mg/dL (ref <30)

## 2014-11-19 MED ORDER — AMLODIPINE BESYLATE 5 MG PO TABS: 5.0000 mg | ORAL_TABLET | Freq: Every day | ORAL | Status: DC

## 2014-11-19 NOTE — Progress Notes (Signed)
[facility] [address], [place] [address] [phone]  Date:  11/19/2014   Name:  Jerry Freeman   DOB:  1955/10/14   MRN:  [id number]  PCP:  No primary care provider on file.    History of Present Illness:  Jerry Freeman is a 59 y.o. male patient who presents to [facility] for medication refill of levothyroxine.  Patient is here for medication refill.  He has been taking 125 mcg of the levothyroxine that he had left over from prior medications.  He has no abnormal fatigue, nausea, vomiting, constipation or diarrhea. He is aware of weight gain, and has not had any dietary restrictions for the last 3 months.  He is eating subs and sandwiches, "because I wanted them".  Blood pressure elevated at todays visit.    He denies any chest pain, palpitiatons, sob or vision changes.      Patient Active Problem List   Diagnosis Date Noted  . DDD (degenerative disc disease), lumbar 11/20/2013  . Allergic rhinitis 12/11/2011  . Family history of colon cancer 12/11/2011  . Cancer of thyroid 12/11/2011  . Hypothyroidism 08/16/2011  . ED (erectile dysfunction) 08/16/2011    Past Medical History  Diagnosis Date  . Hypothyroidism   . Seasonal allergies   . Arthritis   . Cancer     Past Surgical History  Procedure Laterality Date  . Hernia repair      13 yrs ago  . Thyroidectomy      4 or 5 yrs ago    Social History  Substance Use Topics  . Smoking status: Former Smoker -- 18 years    Types: Cigarettes    Quit date: 12/15/1991  . Smokeless tobacco: None     Comment: also smoked marijuana,and cocaine (free-base)   . Alcohol Use: No    Family History  Problem Relation Age of Onset  . Cancer Mother     lung  . Cancer Father     rectal  . Kidney disease Maternal Grandmother     dialysis  . Hypertension Maternal Grandmother   . Arthritis Maternal Grandfather     No Known Allergies  Medication list has been reviewed and updated.  Current Outpatient  Prescriptions on File Prior to Visit  Medication Sig Dispense Refill  . calcium carbonate 1250 MG capsule Take 1,250 mg by mouth 2 (two) times daily with a meal. Per pt it is citracal plus D    . cetirizine (ZYRTEC) 10 MG tablet Take 1 tablet (10 mg total) by mouth daily. 90 tablet 3  . Krill Oil (MAXIMUM RED KRILL PO) Take by mouth.    . levothyroxine (SYNTHROID, LEVOTHROID) 125 MCG tablet Take 1 tablet (125 mcg total) by mouth daily. 90 tablet 1  . meloxicam (MOBIC) 15 MG tablet Take 1 tablet (15 mg total) by mouth daily. X 2weeks then prn pain 90 tablet 2  . Multiple Vitamins-Minerals (MEGA MULTI MEN PO) Take by mouth daily. [place] 50 PLUS    . omeprazole (PRILOSEC) 20 MG capsule Take 1 capsule (20 mg total) by mouth daily. 90 capsule 3  . saw palmetto 160 MG capsule Take 160 mg by mouth daily.    . sildenafil (VIAGRA) 100 MG tablet TAKE 1 TABLET DAILY AS NEEDED FOR ERECTILE DYSFUNCTION 22 tablet 3  . vitamin C (ASCORBIC ACID) 500 MG tablet Take 500 mg by mouth daily.     Marland Kitchen zoster vaccine live, PF, (ZOSTAVAX) 63817 UNT/0.65ML injection Inject 19,400  Units into the skin once. (Patient not taking: Reported on 11/19/2014) 1 each 0   No current facility-administered medications on file prior to visit.    ROS ROS otherwise unremarkable unless listed above.   Physical Examination: BP 156/98 mmHg  Pulse 60  Temp(Src) 98.4 F (36.9 C)  Resp 16  Ht 6\' 1"  (1.854 m)  Wt 211 lb (95.709 kg)  BMI 27.84 kg/m2 Ideal Body Weight: Weight in (lb) to have BMI = 25: 189.1 Weight: 211 lb (95.709 kg)  Wt Readings from Last 3 Encounters:  11/19/14 211 lb (95.709 kg)  03/13/14 202 lb (91.627 kg)  11/20/13 209 lb (94.802 kg)   Physical Exam  Constitutional: He is oriented to person, place, and time. He appears well-developed and well-nourished. No distress.  HENT:  Head: Normocephalic and atraumatic.  Eyes: Conjunctivae and EOM are normal. Pupils are equal, round, and reactive to light.  Neck: Full  passive range of motion without pain. No thyroid mass and no thyromegaly present.  Cardiovascular: Normal rate, regular rhythm and normal heart sounds.  Exam reveals no gallop, no distant heart sounds and no friction rub.   No murmur heard. Pulses:      Radial pulses are 2+ on the right side, and 2+ on the left side.       Dorsalis pedis pulses are 2+ on the right side, and 2+ on the left side.  Pulmonary/Chest: Effort normal. No respiratory distress.  Neurological: He is alert and oriented to person, place, and time.  Skin: Skin is warm and dry. He is not diaphoretic.  Psychiatric: He has a normal mood and affect. His behavior is normal.   Results for orders placed or performed in visit on 11/19/14  COMPLETE METABOLIC PANEL WITH GFR  Result Value Ref Range   Sodium 140 135 - 146 mmol/L   Potassium 4.3 3.5 - 5.3 mmol/L   Chloride 103 98 - 110 mmol/L   CO2 29 20 - 31 mmol/L   Glucose, Bld 76 65 - 99 mg/dL   BUN 14 7 - 25 mg/dL   Creat 1.03 0.70 - 1.25 mg/dL   Total Bilirubin 1.2 0.2 - 1.2 mg/dL   Alkaline Phosphatase 64 40 - 115 U/L   AST 25 10 - 35 U/L   ALT 18 9 - 46 U/L   Total Protein 6.7 6.1 - 8.1 g/dL   Albumin 3.9 3.6 - 5.1 g/dL   Calcium 9.0 8.6 - 10.3 mg/dL   GFR, Est African American >89 >=60 mL/min   GFR, Est Non African American 78 >=60 mL/min  Lipid panel  Result Value Ref Range   Cholesterol 172 125 - 200 mg/dL   Triglycerides 71 <150 mg/dL   HDL 57 >=40 mg/dL   Total CHOL/HDL Ratio 3.0 <=5.0 Ratio   VLDL 14 <30 mg/dL   LDL Cholesterol 101 <130 mg/dL  TSH  Result Value Ref Range   TSH 3.383 0.350 - 4.500 uIU/mL     Assessment and Plan: 59 year old male is here today for medication refill of hypothyroid medication.  It is within normal limits at this time.   -He can continue thyroid medication.   -Starting him on the amlodipine.  He will return in 3 weeks for recheck of hypertension.  Advised to recheck 2x per week at home. -Advising that he restart  exercise and given dash diet instruction.      Need for prophylactic vaccination and inoculation against influenza - Plan: Flu Vaccine QUAD 36+ mos  IM  Essential hypertension - Plan: amLODipine (NORVASC) 5 MG tablet, COMPLETE METABOLIC PANEL WITH GFR, Lipid panel  Hypothyroidism, unspecified hypothyroidism type - Plan: TSH    Ivar Drape, PA-C [facility] 11/19/2014 2:10 PM

## 2014-11-19 NOTE — Patient Instructions (Signed)
Hypertension Hypertension is another name for high blood pressure. High blood pressure forces your heart to work harder to pump blood. A blood pressure reading has two numbers, which includes a higher number over a lower number (example: 110/72). HOME CARE   Have your blood pressure rechecked by your doctor.  Only take medicine as told by your doctor. Follow the directions carefully. The medicine does not work as well if you skip doses. Skipping doses also puts you at risk for problems.  Do not smoke.  Monitor your blood pressure at home as told by your doctor. GET HELP IF:  You think you are having a reaction to the medicine you are taking.  You have repeat headaches or feel dizzy.  You have puffiness (swelling) in your ankles.  You have trouble with your vision. GET HELP RIGHT AWAY IF:   You get a very bad headache and are confused.  You feel weak, numb, or faint.  You get chest or belly (abdominal) pain.  You throw up (vomit).  You cannot breathe very well. MAKE SURE YOU:   Understand these instructions.  Will watch your condition.  Will get help right away if you are not doing well or get worse. Document Released: 08/18/2007 Document Revised: 03/06/2013 Document Reviewed: 12/22/2012 ExitCare Patient Information 2015 [employer]. This information is not intended to replace advice given to you by your health care provider. Make sure you discuss any questions you have with your health care provider.  DASH Eating Plan DASH stands for "Dietary Approaches to Stop Hypertension." The DASH eating plan is a healthy eating plan that has been shown to reduce high blood pressure (hypertension). Additional health benefits may include reducing the risk of type 2 diabetes mellitus, heart disease, and stroke. The DASH eating plan may also help with weight loss. WHAT DO I NEED TO KNOW ABOUT THE DASH EATING PLAN? For the DASH eating plan, you will follow these general  guidelines:  Choose foods with a percent daily value for sodium of less than 5% (as listed on the food label).  Use salt-free seasonings or herbs instead of table salt or sea salt.  Check with your health care provider or pharmacist before using salt substitutes.  Eat lower-sodium products, often labeled as "lower sodium" or "no salt added."  Eat fresh foods.  Eat more vegetables, fruits, and low-fat dairy products.  Choose whole grains. Look for the word "whole" as the first word in the ingredient list.  Choose fish and skinless chicken or turkey more often than red meat. Limit fish, poultry, and meat to 6 oz (170 g) each day.  Limit sweets, desserts, sugars, and sugary drinks.  Choose heart-healthy fats.  Limit cheese to 1 oz (28 g) per day.  Eat more home-cooked food and less restaurant, buffet, and fast food.  Limit fried foods.  Cook foods using methods other than frying.  Limit canned vegetables. If you do use them, rinse them well to decrease the sodium.  When eating at a restaurant, ask that your food be prepared with less salt, or no salt if possible. WHAT FOODS CAN I EAT? Seek help from a dietitian for individual calorie needs. Grains Whole grain or whole wheat bread. Brown rice. Whole grain or whole wheat pasta. Quinoa, bulgur, and whole grain cereals. Low-sodium cereals. Corn or whole wheat flour tortillas. Whole grain cornbread. Whole grain crackers. Low-sodium crackers. Vegetables Fresh or frozen vegetables (raw, steamed, roasted, or grilled). Low-sodium or reduced-sodium tomato and vegetable juices. Low-sodium   or reduced-sodium tomato sauce and paste. Low-sodium or reduced-sodium canned vegetables.  Fruits All fresh, canned (in natural juice), or frozen fruits. Meat and Other Protein Products Ground beef (85% or leaner), grass-fed beef, or beef trimmed of fat. Skinless chicken or turkey. Ground chicken or turkey. Pork trimmed of fat. All fish and seafood.  Eggs. Dried beans, peas, or lentils. Unsalted nuts and seeds. Unsalted canned beans. Dairy Low-fat dairy products, such as skim or 1% milk, 2% or reduced-fat cheeses, low-fat ricotta or cottage cheese, or plain low-fat yogurt. Low-sodium or reduced-sodium cheeses. Fats and Oils Tub margarines without trans fats. Light or reduced-fat mayonnaise and salad dressings (reduced sodium). Avocado. Safflower, olive, or canola oils. Natural peanut or almond butter. Other Unsalted popcorn and pretzels. The items listed above may not be a complete list of recommended foods or beverages. Contact your dietitian for more options. WHAT FOODS ARE NOT RECOMMENDED? Grains White bread. White pasta. White rice. Refined cornbread. Bagels and croissants. Crackers that contain trans fat. Vegetables Creamed or fried vegetables. Vegetables in a cheese sauce. Regular canned vegetables. Regular canned tomato sauce and paste. Regular tomato and vegetable juices. Fruits Dried fruits. Canned fruit in light or heavy syrup. Fruit juice. Meat and Other Protein Products Fatty cuts of meat. Ribs, chicken wings, bacon, sausage, bologna, salami, chitterlings, fatback, hot dogs, bratwurst, and packaged luncheon meats. Salted nuts and seeds. Canned beans with salt. Dairy Whole or 2% milk, cream, half-and-half, and cream cheese. Whole-fat or sweetened yogurt. Full-fat cheeses or blue cheese. Nondairy creamers and whipped toppings. Processed cheese, cheese spreads, or cheese curds. Condiments Onion and garlic salt, seasoned salt, table salt, and sea salt. Canned and packaged gravies. Worcestershire sauce. Tartar sauce. Barbecue sauce. Teriyaki sauce. Soy sauce, including reduced sodium. Steak sauce. Fish sauce. Oyster sauce. Cocktail sauce. Horseradish. Ketchup and mustard. Meat flavorings and tenderizers. Bouillon cubes. Hot sauce. Tabasco sauce. Marinades. Taco seasonings. Relishes. Fats and Oils Butter, stick margarine, lard,  shortening, ghee, and bacon fat. Coconut, palm kernel, or palm oils. Regular salad dressings. Other Pickles and olives. Salted popcorn and pretzels. The items listed above may not be a complete list of foods and beverages to avoid. Contact your dietitian for more information. WHERE CAN I FIND MORE INFORMATION? National Heart, Lung, and Blood Institute: www.nhlbi.nih.gov/health/health-topics/topics/dash/ Document Released: 02/18/2011 Document Revised: 07/16/2013 Document Reviewed: 01/03/2013 ExitCare Patient Information 2015 [employer]. This information is not intended to replace advice given to you by your health care provider. Make sure you discuss any questions you have with your health care provider.  

## 2014-11-20 LAB — TSH: TSH: 3.383 u[IU]/mL (ref 0.350–4.500)

## 2014-11-25 MED ORDER — LEVOTHYROXINE SODIUM 125 MCG PO TABS: 125.0000 ug | ORAL_TABLET | Freq: Every day | ORAL | Status: DC

## 2014-11-27 ENCOUNTER — Telehealth: Payer: Self-pay

## 2014-11-27 ENCOUNTER — Other Ambulatory Visit: Payer: Self-pay

## 2014-11-27 MED ORDER — CETIRIZINE HCL 10 MG PO TABS: 10.0000 mg | ORAL_TABLET | Freq: Every day | ORAL | Status: DC

## 2014-11-27 MED ORDER — MELOXICAM 15 MG PO TABS: 15.0000 mg | ORAL_TABLET | Freq: Every day | ORAL | Status: DC

## 2014-11-27 NOTE — Telephone Encounter (Signed)
Also got a req for RFs of meloxicam.

## 2014-11-27 NOTE — Telephone Encounter (Signed)
Pt cllng in wanting lab results from 11/19/14 ov and to specifically speak to provider regarding lab work. Pt states he would also like a copy mailed to him once he speaks to provider. Pt was advised request would be sent to provider. Pt stated he could be reached at [phone].

## 2014-11-27 NOTE — Telephone Encounter (Signed)
[facility] sent req for RFs of cetirizine. Colletta [place], you just saw pt for med refills, but don't see this one discussed. OK to give RFs?

## 2014-11-28 NOTE — Telephone Encounter (Signed)
Spoke with patient regarding lab results.  Lipid panel is normal. Tsh within normal rate.   We can revisit in 6 months.  He can continue dosage.   Patient has resumed exercise, and will begin healthy eating habits.  He will return to the office in 2-3 weeks to recheck start of anti-hypertensive, amlodipine.

## 2014-12-18 ENCOUNTER — Other Ambulatory Visit: Payer: Self-pay | Admitting: Physician Assistant

## 2015-01-24 ENCOUNTER — Other Ambulatory Visit: Payer: Self-pay | Admitting: Physician Assistant

## 2015-01-24 ENCOUNTER — Ambulatory Visit (INDEPENDENT_AMBULATORY_CARE_PROVIDER_SITE_OTHER): Payer: BLUE CROSS/BLUE SHIELD

## 2015-01-24 DIAGNOSIS — I10 Essential (primary) hypertension: Secondary | ICD-10-CM

## 2015-01-24 MED ORDER — AMLODIPINE BESYLATE 5 MG PO TABS: ORAL_TABLET | ORAL | Status: DC

## 2015-01-24 NOTE — Progress Notes (Signed)
Patient left today, before being seen--stating that he was waiting in the patient room for about 1 hour (this was confirmed).  I was never alerted of his arrival in a room.  His BP appears much improved.  He states that his BP at home is around 120s/80s.  He has attempted to change his diet, but continues to eating sugars and desserts.  He has been limiting his bread intake at this time.  He also is exercising 3-4 times per week at this time.  He has some complaint of leg feet cramping, but states that he has not been hydrating well when he works out. He has agreed to increase water intake to 64 oz per day. Continue BP medication amlodipine 5mg . Return to clinic in march, for follow up of BP and thyroid.

## 2015-04-03 ENCOUNTER — Ambulatory Visit (INDEPENDENT_AMBULATORY_CARE_PROVIDER_SITE_OTHER): Payer: BLUE CROSS/BLUE SHIELD | Admitting: Physician Assistant

## 2015-04-03 VITALS — BP 128/82 | HR 57 | Temp 97.7°F | Resp 18 | Ht 72.0 in | Wt 208.0 lb

## 2015-04-03 DIAGNOSIS — Z1329 Encounter for screening for other suspected endocrine disorder: Secondary | ICD-10-CM

## 2015-04-03 DIAGNOSIS — Z13228 Encounter for screening for other metabolic disorders: Secondary | ICD-10-CM | POA: Diagnosis not present

## 2015-04-03 DIAGNOSIS — Z0000 Encounter for general adult medical examination without abnormal findings: Secondary | ICD-10-CM | POA: Diagnosis not present

## 2015-04-03 DIAGNOSIS — Z125 Encounter for screening for malignant neoplasm of prostate: Secondary | ICD-10-CM | POA: Diagnosis not present

## 2015-04-03 DIAGNOSIS — Z130 Encounter for screening for diseases of the blood and blood-forming organs and certain disorders involving the immune mechanism: Secondary | ICD-10-CM

## 2015-04-03 LAB — CBC
HCT: 41.3 % (ref 39.0–52.0)
HEMOGLOBIN: 13.7 g/dL (ref 13.0–17.0)
MCH: 27.2 pg (ref 26.0–34.0)
MCHC: 33.2 g/dL (ref 30.0–36.0)
MCV: 82.1 fL (ref 78.0–100.0)
MPV: 10.3 fL (ref 8.6–12.4)
PLATELETS: 183 10*3/uL (ref 150–400)
RBC: 5.03 MIL/uL (ref 4.22–5.81)
RDW: 15.8 % — ABNORMAL HIGH (ref 11.5–15.5)
WBC: 3.3 10*3/uL — ABNORMAL LOW (ref 4.0–10.5)

## 2015-04-03 LAB — COMPLETE METABOLIC PANEL WITH GFR
ALBUMIN: 4.4 g/dL (ref 3.6–5.1)
ALK PHOS: 57 U/L (ref 40–115)
ALT: 21 U/L (ref 9–46)
AST: 27 U/L (ref 10–35)
BILIRUBIN TOTAL: 1.2 mg/dL (ref 0.2–1.2)
BUN: 15 mg/dL (ref 7–25)
CO2: 29 mmol/L (ref 20–31)
Calcium: 8.9 mg/dL (ref 8.6–10.3)
Chloride: 105 mmol/L (ref 98–110)
Creat: 1.17 mg/dL (ref 0.70–1.25)
GFR, Est African American: 77 mL/min (ref >=60)
GFR, Est Non African American: 67 mL/min (ref >=60)
GLUCOSE: 85 mg/dL (ref 65–99)
Potassium: 4.1 mmol/L (ref 3.5–5.3)
SODIUM: 138 mmol/L (ref 135–146)
TOTAL PROTEIN: 7 g/dL (ref 6.1–8.1)

## 2015-04-03 LAB — TSH: TSH: 5.805 u[IU]/mL — ABNORMAL HIGH (ref 0.350–4.500)

## 2015-04-03 NOTE — Patient Instructions (Signed)
Physical exam looks great, continue to exercise.  This is very beneficial to your overall health.   I, or the office, will contact you with the results of your bloodwork within the next 10 days.  Keeping you healthy  Get these tests  Blood pressure- Have your blood pressure checked once a year by your healthcare provider.  Normal blood pressure is 120/80  Weight- Have your body mass index (BMI) calculated to screen for obesity.  BMI is a measure of body fat based on height and weight. You can also calculate your own BMI at ViewBanking.si.  Cholesterol- Have your cholesterol checked every year.  Diabetes- Have your blood sugar checked regularly if you have high blood pressure, high cholesterol, have a family history of diabetes or if you are overweight.  Screening for Colon Cancer- Colonoscopy starting at age 59.  Screening may begin sooner depending on your family history and other health conditions. Follow up colonoscopy as directed by your Gastroenterologist.  Screening for Prostate Cancer- Both blood work (PSA) and a rectal exam help screen for Prostate Cancer.  Screening begins at age 67 with African-American men and at age 29 with Caucasian men.  Screening may begin sooner depending on your family history.  Take these medicines  Aspirin- One aspirin daily can help prevent Heart disease and Stroke.  Flu shot- Every fall.  Tetanus- Every 10 years.  Zostavax- Once after the age of 29 to prevent Shingles.  Pneumonia shot- Once after the age of 57; if you are younger than 50, ask your healthcare provider if you need a Pneumonia shot.  Take these steps  Don't smoke- If you do smoke, talk to your doctor about quitting.  For tips on how to quit, go to www.smokefree.gov or call 1-800-QUIT-NOW.  Be physically active- Exercise 5 days a week for at least 30 minutes.  If you are not already physically active start slow and gradually work up to 30 minutes of moderate physical  activity.  Examples of moderate activity include walking briskly, mowing the yard, dancing, swimming, bicycling, etc.  Eat a healthy diet- Eat a variety of healthy food such as fruits, vegetables, low fat milk, low fat cheese, yogurt, lean meant, poultry, fish, beans, tofu, etc. For more information go to www.thenutritionsource.org  Drink alcohol in moderation- Limit alcohol intake to less than two drinks a day. Never drink and drive.  Dentist- Brush and floss twice daily; visit your dentist twice a year.  Depression- Your emotional health is as important as your physical health. If you're feeling down, or losing interest in things you would normally enjoy please talk to your healthcare provider.  Eye exam- Visit your eye doctor every year.  Safe sex- If you may be exposed to a sexually transmitted infection, use a condom.  Seat belts- Seat belts can save your life; always wear one.  Smoke/Carbon Monoxide detectors- These detectors need to be installed on the appropriate level of your home.  Replace batteries at least once a year.  Skin cancer- When out in the sun, cover up and use sunscreen 15 SPF or higher.  Violence- If anyone is threatening you, please tell your healthcare provider.  Living Will/ Health care power of attorney- Speak with your healthcare provider and family.

## 2015-04-03 NOTE — Progress Notes (Signed)
[facility] [address], [place] [address] [phone]  Date:  04/03/2015   Name:  Jerry Freeman   DOB:  [birth year]   MRN:  [id number]  PCP:  No primary care provider on file.    History of Present Illness:  Jerry Freeman is a 60 y.o. male patient who presents to [facility] for annual physical exam.    Diet: 30 minutes on elliptical heart pumping exercise 3-4 times per week.  And 20 minutes of toning weights.  Eating cakes and pies still.  He has been avoiding processed meats in his sandwiches and subs.  Water intake: 70oz water per day.  No sodas.  16oz cup of coffee. (His diet has improved from the summer, as he was not restricting his diet with processed food)    BM: Normal.  No constipation or diarrhea.  No blood in the stool.    Urination: urinary weakness unless loaded on water.  No hematuria or frequency.  Sleep: sleep well.  Feel rested when he wakes.    EtOH: none Tobacco: none illcit drug use: none  Sexual activity fine at this time. 2 children in late 20s, and early 9s.  Patient Active Problem List   Diagnosis Date Noted  . DDD (degenerative disc disease), lumbar 11/20/2013  . Allergic rhinitis 12/11/2011  . Family history of colon cancer 12/11/2011  . Cancer of thyroid ([place]) 12/11/2011  . Hypothyroidism 08/16/2011  . ED (erectile dysfunction) 08/16/2011    Past Medical History  Diagnosis Date  . Hypothyroidism   . Seasonal allergies   . Arthritis   . Cancer [facility])     Past Surgical History  Procedure Laterality Date  . Hernia repair      13 yrs ago  . Thyroidectomy      4 or 5 yrs ago    Social History  Substance Use Topics  . Smoking status: Current Every Day Smoker -- 18 years    Types: Cigarettes    Last Attempt to Quit: 12/15/1991  . Smokeless tobacco: None     Comment: also smoked marijuana,and cocaine (free-base)   . Alcohol Use: No    Family History  Problem Relation Age of Onset  . Cancer Mother     lung  .  Cancer Father     rectal  . Kidney disease Maternal Grandmother     dialysis  . Hypertension Maternal Grandmother   . Arthritis Maternal Grandfather     No Known Allergies  Medication list has been reviewed and updated.  Current Outpatient Prescriptions on File Prior to Visit  Medication Sig Dispense Refill  . amLODipine (NORVASC) 5 MG tablet TAKE 1 TABLET DAILY 90 tablet 1  . calcium carbonate 1250 MG capsule Take 1,250 mg by mouth 2 (two) times daily with a meal. Per pt it is citracal plus D    . Krill Oil (MAXIMUM RED KRILL PO) Take by mouth.    . levothyroxine (SYNTHROID, LEVOTHROID) 125 MCG tablet Take 1 tablet (125 mcg total) by mouth daily. 90 tablet 1  . meloxicam (MOBIC) 15 MG tablet Take 1 tablet (15 mg total) by mouth daily. X 2weeks then prn pain 90 tablet 3  . Multiple Vitamins-Minerals (MEGA MULTI MEN PO) Take by mouth daily. GNC 50 PLUS    . saw palmetto 160 MG capsule Take 160 mg by mouth daily.    . sildenafil (VIAGRA) 100 MG tablet TAKE 1 TABLET DAILY AS NEEDED FOR ERECTILE DYSFUNCTION 22 tablet  3  . vitamin C (ASCORBIC ACID) 500 MG tablet Take 500 mg by mouth daily.     . cetirizine (ZYRTEC) 10 MG tablet Take 1 tablet (10 mg total) by mouth daily. (Patient not taking: Reported on 04/03/2015) 90 tablet 3  . omeprazole (PRILOSEC) 20 MG capsule Take 1 capsule (20 mg total) by mouth daily. (Patient not taking: Reported on 04/03/2015) 90 capsule 3  . zoster vaccine live, PF, (ZOSTAVAX) 29562 UNT/0.65ML injection Inject 19,400 Units into the skin once. (Patient not taking: Reported on 04/03/2015) 1 each 0   No current facility-administered medications on file prior to visit.    Review of Systems  Constitutional: Negative for fever and chills.  HENT: Negative for ear discharge, ear pain and sore throat.   Eyes: Negative for blurred vision and double vision.  Respiratory: Negative for cough, shortness of breath and wheezing.   Cardiovascular: Negative for chest pain,  palpitations and leg swelling.  Gastrointestinal: Negative for nausea, vomiting and diarrhea.  Genitourinary: Negative for dysuria, frequency and hematuria.  Skin: Negative for itching and rash.  Neurological: Negative for dizziness and headaches.     Physical Examination: BP 128/82 mmHg  Pulse 57  Temp(Src) 97.7 F (36.5 C) (Oral)  Resp 18  Ht 6' (1.829 m)  Wt 208 lb (94.348 kg)  BMI 28.20 kg/m2  SpO2 97% Ideal Body Weight: Weight in (lb) to have BMI = 25: 183.9 Wt Readings from Last 3 Encounters:  04/03/15 208 lb (94.348 kg)  01/24/15 213 lb 6.4 oz (96.798 kg)  11/19/14 211 lb (95.709 kg)   Physical Exam  Constitutional: He is oriented to person, place, and time. He appears well-developed and well-nourished. No distress.  HENT:  Head: Normocephalic and atraumatic.  Right Ear: Tympanic membrane, external ear and ear canal normal.  Left Ear: Tympanic membrane, external ear and ear canal normal.  Eyes: Conjunctivae and EOM are normal. Pupils are equal, round, and reactive to light.  Cardiovascular: Normal rate and regular rhythm.  Exam reveals no friction rub.   No murmur heard. Pulmonary/Chest: Effort normal. No respiratory distress. He has no wheezes.  Abdominal: Soft. Bowel sounds are normal. He exhibits no distension and no mass. There is no tenderness.  Musculoskeletal: Normal range of motion. He exhibits no edema or tenderness.  Neurological: He is alert and oriented to person, place, and time. He displays normal reflexes.  Skin: Skin is warm and dry. He is not diaphoretic.  Psychiatric: He has a normal mood and affect. His behavior is normal.     Assessment and Plan: Jerry Freeman is a 60 y.o. male who is here today for cc of annual physical exam.   Normal exam.  He declines prostate exam today.  We will also do psa. Will recheck thyroid at this time.   General labs performed.   Colonoscopy utd, flu vaccine utd  Annual physical exam - Plan: TSH, COMPLETE  METABOLIC PANEL WITH GFR, CBC, PSA  Screening for prostate cancer - Plan: PSA  Screening for deficiency anemia - Plan: CBC  Screening for thyroid disorder - Plan: TSH  Screening for metabolic disorder - Plan: COMPLETE METABOLIC PANEL WITH GFR  Ivar Drape, PA-C [facility] 04/03/2015 9:58 AM

## 2015-04-04 LAB — PSA: PSA: 0.66 ng/mL (ref <=4.00)

## 2015-04-08 ENCOUNTER — Encounter: Payer: Self-pay | Admitting: Physician Assistant

## 2015-04-08 ENCOUNTER — Other Ambulatory Visit: Payer: Self-pay | Admitting: Physician Assistant

## 2015-04-08 DIAGNOSIS — E890 Postprocedural hypothyroidism: Secondary | ICD-10-CM

## 2015-04-08 DIAGNOSIS — C73 Malignant neoplasm of thyroid gland: Secondary | ICD-10-CM

## 2015-04-08 MED ORDER — LEVOTHYROXINE SODIUM 150 MCG PO TABS: 150.0000 ug | ORAL_TABLET | Freq: Every day | ORAL | Status: DC

## 2015-06-29 DIAGNOSIS — S8392XA Sprain of unspecified site of left knee, initial encounter: Secondary | ICD-10-CM | POA: Diagnosis not present

## 2015-06-29 DIAGNOSIS — S76112A Strain of left quadriceps muscle, fascia and tendon, initial encounter: Secondary | ICD-10-CM | POA: Diagnosis not present

## 2015-07-11 DIAGNOSIS — S8981XA Other specified injuries of right lower leg, initial encounter: Secondary | ICD-10-CM | POA: Diagnosis not present

## 2015-07-11 DIAGNOSIS — M25562 Pain in left knee: Secondary | ICD-10-CM | POA: Diagnosis not present

## 2015-07-14 DIAGNOSIS — M1712 Unilateral primary osteoarthritis, left knee: Secondary | ICD-10-CM | POA: Diagnosis not present

## 2015-07-14 DIAGNOSIS — S76112D Strain of left quadriceps muscle, fascia and tendon, subsequent encounter: Secondary | ICD-10-CM | POA: Diagnosis not present

## 2015-07-17 DIAGNOSIS — G8918 Other acute postprocedural pain: Secondary | ICD-10-CM | POA: Diagnosis not present

## 2015-07-17 DIAGNOSIS — Y999 Unspecified external cause status: Secondary | ICD-10-CM | POA: Diagnosis not present

## 2015-07-17 DIAGNOSIS — S76112D Strain of left quadriceps muscle, fascia and tendon, subsequent encounter: Secondary | ICD-10-CM | POA: Diagnosis not present

## 2015-07-17 DIAGNOSIS — Y939 Activity, unspecified: Secondary | ICD-10-CM | POA: Diagnosis not present

## 2015-07-17 DIAGNOSIS — S76112A Strain of left quadriceps muscle, fascia and tendon, initial encounter: Secondary | ICD-10-CM | POA: Diagnosis not present

## 2015-07-17 DIAGNOSIS — Y929 Unspecified place or not applicable: Secondary | ICD-10-CM | POA: Diagnosis not present

## 2015-07-17 DIAGNOSIS — X58XXXA Exposure to other specified factors, initial encounter: Secondary | ICD-10-CM | POA: Diagnosis not present

## 2015-07-17 MED FILL — METHOCARBAMOL 500 MG TABLET: 500 | 17 days supply | Qty: 50 | Fill #0

## 2015-07-17 MED FILL — OXYCODONE/APAP 5-325: 5-325 | 5 days supply | Qty: 60 | Fill #0

## 2015-07-17 MED FILL — CEPHALEXIN 500 MG CAPSULE: 500 | 4 days supply | Qty: 12 | Fill #0

## 2015-07-31 DIAGNOSIS — M1712 Unilateral primary osteoarthritis, left knee: Secondary | ICD-10-CM | POA: Diagnosis not present

## 2015-07-31 DIAGNOSIS — S76112D Strain of left quadriceps muscle, fascia and tendon, subsequent encounter: Secondary | ICD-10-CM | POA: Diagnosis not present

## 2015-08-05 ENCOUNTER — Ambulatory Visit (INDEPENDENT_AMBULATORY_CARE_PROVIDER_SITE_OTHER): Payer: BLUE CROSS/BLUE SHIELD | Admitting: Physician Assistant

## 2015-08-05 VITALS — BP 110/74 | HR 78 | Temp 98.0°F | Resp 18 | Ht 72.0 in | Wt 206.0 lb

## 2015-08-05 DIAGNOSIS — I10 Essential (primary) hypertension: Secondary | ICD-10-CM

## 2015-08-05 DIAGNOSIS — E890 Postprocedural hypothyroidism: Secondary | ICD-10-CM

## 2015-08-05 LAB — TSH: TSH: 2.3 m[IU]/L (ref 0.40–4.50)

## 2015-08-05 NOTE — Patient Instructions (Addendum)
     IF you received an x-ray today, you will receive an invoice from [facility]. Please contact [facility] at [phone] with questions or concerns regarding your invoice.   IF you received labwork today, you will receive an invoice from [employer]. Please contact [facility] at [phone] with questions or concerns regarding your invoice.   Our billing staff will not be able to assist you with questions regarding bills from these companies.  You will be contacted with the lab results as soon as they are available. The fastest way to get your results is to activate your My Chart account. Instructions are located on the last page of this paperwork. If you have not heard from [place] regarding the results in 2 weeks, please contact this office.    I am refilling your amlodipine at this time.  I will have your lab results within the week, and we will then refill at that time.   Good to see you.  There are some chair exercises on youtube that you could try that doesn't involve your legs, if you want to do some workout.   Continue to avoid the processed foods, and desserts--the way you are doing.  It's working well for you!!

## 2015-08-05 NOTE — Progress Notes (Signed)
[facility] [address] [place] [address] [phone]  Date:  08/05/2015   Name:  Jerry Freeman   DOB:  Mar 19, 1955   MRN:  [id number]  PCP:  No primary care provider on file.    History of Present Illness:  Jerry Freeman is a 60 y.o. male patient who presents to [facility] for thyroid recheck. Patient is taking the Synthroid compliantly. He has no complaints or concerns at this time. Patient recently ruptured his hamstring, and had to have immediate surgery 2 months ago. He states that he was attempting to exercise several times per week, however the injury has kept him from this. Patient has been avoiding sandwiches and desserts which has been his problem with weight gain and not helpful for his high blood pressure. He has maintained this throughout this injury. He denies any chest pains, palpitations, nausea, dizziness, diarrhea, constipation, shortness of breath. He has noticed that his skin has been more dry. He generally gets a steroi Oceanographer, however his dermatologist used to give him this medication as needed.    Patient Active Problem List   Diagnosis Date Noted  . DDD (degenerative disc disease), lumbar 11/20/2013  . Allergic rhinitis 12/11/2011  . Family history of colon cancer 12/11/2011  . Cancer of thyroid ([place]) 12/11/2011  . Hypothyroidism 08/16/2011  . ED (erectile dysfunction) 08/16/2011    Past Medical History  Diagnosis Date  . Hypothyroidism   . Seasonal allergies   . Arthritis   . Cancer [facility])     Past Surgical History  Procedure Laterality Date  . Hernia repair      13 yrs ago  . Thyroidectomy      4 or 5 yrs ago    Social History  Substance Use Topics  . Smoking status: Current Every Day Smoker -- 18 years    Types: Cigarettes    Last Attempt to Quit: 12/15/1991  . Smokeless tobacco: None     Comment: also smoked marijuana,and cocaine (free-base)   . Alcohol Use: No    Family History  Problem Relation Age of  Onset  . Cancer Mother     lung  . Cancer Father     rectal  . Kidney disease Maternal Grandmother     dialysis  . Hypertension Maternal Grandmother   . Arthritis Maternal Grandfather     No Known Allergies  Medication list has been reviewed and updated.  Current Outpatient Prescriptions on File Prior to Visit  Medication Sig Dispense Refill  . amLODipine (NORVASC) 5 MG tablet TAKE 1 TABLET DAILY 90 tablet 1  . calcium carbonate 1250 MG capsule Take 1,250 mg by mouth 2 (two) times daily with a meal. Per pt it is citracal plus D    . cetirizine (ZYRTEC) 10 MG tablet Take 1 tablet (10 mg total) by mouth daily. 90 tablet 3  . Krill Oil (MAXIMUM RED KRILL PO) Take by mouth.    . levothyroxine (SYNTHROID, LEVOTHROID) 150 MCG tablet Take 1 tablet (150 mcg total) by mouth daily. 60 tablet 0  . meloxicam (MOBIC) 15 MG tablet Take 1 tablet (15 mg total) by mouth daily. X 2weeks then prn pain 90 tablet 3  . Multiple Vitamins-Minerals (MEGA MULTI MEN PO) Take by mouth daily. GNC 50 PLUS    . saw palmetto 160 MG capsule Take 160 mg by mouth daily.    . sildenafil (VIAGRA) 100 MG tablet TAKE 1 TABLET DAILY AS NEEDED FOR ERECTILE DYSFUNCTION 22  tablet 3  . vitamin C (ASCORBIC ACID) 500 MG tablet Take 500 mg by mouth daily.     Marland Kitchen zoster vaccine live, PF, (ZOSTAVAX) 09811 UNT/0.65ML injection Inject 19,400 Units into the skin once. 1 each 0  . omeprazole (PRILOSEC) 20 MG capsule Take 1 capsule (20 mg total) by mouth daily. (Patient not taking: Reported on 04/03/2015) 90 capsule 3   No current facility-administered medications on file prior to visit.    ROS  ROS otherwise unremarkable unless listed above.  Physical Examination: BP 110/74 mmHg  Pulse 78  Temp(Src) 98 F (36.7 C) (Oral)  Resp 18  Ht 6' (1.829 m)  Wt 206 lb (93.441 kg)  BMI 27.93 kg/m2  SpO2 98% Ideal Body Weight: Weight in (lb) to have BMI = 25: 183.9  Physical Exam  Constitutional: He is oriented to person, place, and  time. He appears well-developed and well-nourished. No distress.  HENT:  Head: Normocephalic and atraumatic.  Eyes: Conjunctivae and EOM are normal. Pupils are equal, round, and reactive to light.  Neck: No tracheal deviation present. No thyromegaly present.  Cardiovascular: Normal rate and regular rhythm.   Pulmonary/Chest: Effort normal. No respiratory distress. He has no wheezes.  Musculoskeletal: He exhibits no edema.  Lymphadenopathy:    He has no cervical adenopathy.  Neurological: He is alert and oriented to person, place, and time.  Skin: Skin is warm and dry. He is not diaphoretic.  Psychiatric: He has a normal mood and affect. His behavior is normal.   Results for orders placed or performed in visit on 08/05/15  TSH  Result Value Ref Range   TSH 2.30 0.40 - 4.50 mIU/L   Assessment and Plan Jerry Freeman is a 60 y.o. male who is here today For thyroid recheck. We will refill medication pending lab results. Blood pressure is doing well at this time. It is stable. We can refill him for the next 6 months. Postoperative hypothyroidism - Plan: TSH, Care order/instruction  Essential hypertension  Jerry Drape, PA-C [facility] 08/05/2015 9:54 AM

## 2015-08-08 ENCOUNTER — Other Ambulatory Visit: Payer: Self-pay | Admitting: Physician Assistant

## 2015-08-08 ENCOUNTER — Telehealth: Payer: Self-pay

## 2015-08-08 DIAGNOSIS — I10 Essential (primary) hypertension: Secondary | ICD-10-CM

## 2015-08-08 DIAGNOSIS — E890 Postprocedural hypothyroidism: Secondary | ICD-10-CM

## 2015-08-08 MED ORDER — LEVOTHYROXINE SODIUM 150 MCG PO TABS: 150.0000 ug | ORAL_TABLET | Freq: Every day | ORAL | Status: DC

## 2015-08-08 NOTE — Telephone Encounter (Signed)
Pt had some medications left and will wait on a call from you about lab work to see if you need to adjust anything.  Also I saw on his AVS that you were going to send in amlodipine but it was not sent.  He said he had some of that left to.

## 2015-08-08 NOTE — Telephone Encounter (Signed)
Pt called saying that none of his prescriptions were sent in when he was seen on 08-05-15.  He also would like his lab notes.  Please call asap  [phone]

## 2015-08-09 MED ORDER — AMLODIPINE BESYLATE 5 MG PO TABS: ORAL_TABLET | ORAL | Status: DC

## 2015-08-12 NOTE — Telephone Encounter (Signed)
Patient wants to speak to [facility] about a prescription. He states there's a prescription that's supposed to have been hand written that he never got. Patient would not give more details. He states he just wants to talk with Col[place]. Please call!! [phone]

## 2015-08-14 DIAGNOSIS — M1712 Unilateral primary osteoarthritis, left knee: Secondary | ICD-10-CM | POA: Diagnosis not present

## 2015-08-15 MED ORDER — TRIAMCINOLONE 0.1 % CREAM:EUCERIN CREAM 1:1: 1.0000 "application " | TOPICAL_CREAM | Freq: Two times a day (BID) | CUTANEOUS | Status: DC

## 2015-08-15 NOTE — Telephone Encounter (Signed)
Patient advised that medications were sent to [facility] for triamcinolone.  Others in the mail.  Return to clinic in 6 months

## 2015-08-22 DIAGNOSIS — S76112D Strain of left quadriceps muscle, fascia and tendon, subsequent encounter: Secondary | ICD-10-CM | POA: Diagnosis not present

## 2015-08-26 DIAGNOSIS — S76112D Strain of left quadriceps muscle, fascia and tendon, subsequent encounter: Secondary | ICD-10-CM | POA: Diagnosis not present

## 2015-08-29 DIAGNOSIS — S76112D Strain of left quadriceps muscle, fascia and tendon, subsequent encounter: Secondary | ICD-10-CM | POA: Diagnosis not present

## 2015-09-02 DIAGNOSIS — S76112D Strain of left quadriceps muscle, fascia and tendon, subsequent encounter: Secondary | ICD-10-CM | POA: Diagnosis not present

## 2015-09-05 DIAGNOSIS — S76112D Strain of left quadriceps muscle, fascia and tendon, subsequent encounter: Secondary | ICD-10-CM | POA: Diagnosis not present

## 2015-09-09 DIAGNOSIS — S76112D Strain of left quadriceps muscle, fascia and tendon, subsequent encounter: Secondary | ICD-10-CM | POA: Diagnosis not present

## 2015-09-12 DIAGNOSIS — Z4789 Encounter for other orthopedic aftercare: Secondary | ICD-10-CM | POA: Diagnosis not present

## 2015-09-12 DIAGNOSIS — S76112D Strain of left quadriceps muscle, fascia and tendon, subsequent encounter: Secondary | ICD-10-CM | POA: Diagnosis not present

## 2015-09-15 DIAGNOSIS — S76112D Strain of left quadriceps muscle, fascia and tendon, subsequent encounter: Secondary | ICD-10-CM | POA: Diagnosis not present

## 2015-09-19 DIAGNOSIS — S76112D Strain of left quadriceps muscle, fascia and tendon, subsequent encounter: Secondary | ICD-10-CM | POA: Diagnosis not present

## 2015-09-23 DIAGNOSIS — S76112D Strain of left quadriceps muscle, fascia and tendon, subsequent encounter: Secondary | ICD-10-CM | POA: Diagnosis not present

## 2015-09-26 DIAGNOSIS — S76112D Strain of left quadriceps muscle, fascia and tendon, subsequent encounter: Secondary | ICD-10-CM | POA: Diagnosis not present

## 2015-09-30 DIAGNOSIS — S76112D Strain of left quadriceps muscle, fascia and tendon, subsequent encounter: Secondary | ICD-10-CM | POA: Diagnosis not present

## 2015-10-03 DIAGNOSIS — S76112D Strain of left quadriceps muscle, fascia and tendon, subsequent encounter: Secondary | ICD-10-CM | POA: Diagnosis not present

## 2015-10-07 DIAGNOSIS — S76112D Strain of left quadriceps muscle, fascia and tendon, subsequent encounter: Secondary | ICD-10-CM | POA: Diagnosis not present

## 2015-10-10 DIAGNOSIS — S76112D Strain of left quadriceps muscle, fascia and tendon, subsequent encounter: Secondary | ICD-10-CM | POA: Diagnosis not present

## 2015-10-14 DIAGNOSIS — S76112D Strain of left quadriceps muscle, fascia and tendon, subsequent encounter: Secondary | ICD-10-CM | POA: Diagnosis not present

## 2015-10-16 DIAGNOSIS — S76112D Strain of left quadriceps muscle, fascia and tendon, subsequent encounter: Secondary | ICD-10-CM | POA: Diagnosis not present

## 2015-10-20 DIAGNOSIS — S76112D Strain of left quadriceps muscle, fascia and tendon, subsequent encounter: Secondary | ICD-10-CM | POA: Diagnosis not present

## 2015-10-20 DIAGNOSIS — M1712 Unilateral primary osteoarthritis, left knee: Secondary | ICD-10-CM | POA: Diagnosis not present

## 2015-10-21 DIAGNOSIS — S76112D Strain of left quadriceps muscle, fascia and tendon, subsequent encounter: Secondary | ICD-10-CM | POA: Diagnosis not present

## 2015-10-24 DIAGNOSIS — S76112D Strain of left quadriceps muscle, fascia and tendon, subsequent encounter: Secondary | ICD-10-CM | POA: Diagnosis not present

## 2015-10-28 DIAGNOSIS — S76112D Strain of left quadriceps muscle, fascia and tendon, subsequent encounter: Secondary | ICD-10-CM | POA: Diagnosis not present

## 2015-10-31 DIAGNOSIS — S76112D Strain of left quadriceps muscle, fascia and tendon, subsequent encounter: Secondary | ICD-10-CM | POA: Diagnosis not present

## 2015-11-04 DIAGNOSIS — S76112D Strain of left quadriceps muscle, fascia and tendon, subsequent encounter: Secondary | ICD-10-CM | POA: Diagnosis not present

## 2015-11-07 DIAGNOSIS — S76112D Strain of left quadriceps muscle, fascia and tendon, subsequent encounter: Secondary | ICD-10-CM | POA: Diagnosis not present

## 2015-11-11 DIAGNOSIS — S76112D Strain of left quadriceps muscle, fascia and tendon, subsequent encounter: Secondary | ICD-10-CM | POA: Diagnosis not present

## 2015-11-14 DIAGNOSIS — S76112D Strain of left quadriceps muscle, fascia and tendon, subsequent encounter: Secondary | ICD-10-CM | POA: Diagnosis not present

## 2015-11-19 DIAGNOSIS — Z4789 Encounter for other orthopedic aftercare: Secondary | ICD-10-CM | POA: Diagnosis not present

## 2015-11-25 DIAGNOSIS — S76112D Strain of left quadriceps muscle, fascia and tendon, subsequent encounter: Secondary | ICD-10-CM | POA: Diagnosis not present

## 2015-11-27 DIAGNOSIS — S76112D Strain of left quadriceps muscle, fascia and tendon, subsequent encounter: Secondary | ICD-10-CM | POA: Diagnosis not present

## 2015-12-02 DIAGNOSIS — S76112D Strain of left quadriceps muscle, fascia and tendon, subsequent encounter: Secondary | ICD-10-CM | POA: Diagnosis not present

## 2015-12-05 DIAGNOSIS — S76112D Strain of left quadriceps muscle, fascia and tendon, subsequent encounter: Secondary | ICD-10-CM | POA: Diagnosis not present

## 2015-12-09 DIAGNOSIS — S76112D Strain of left quadriceps muscle, fascia and tendon, subsequent encounter: Secondary | ICD-10-CM | POA: Diagnosis not present

## 2015-12-12 DIAGNOSIS — S76112D Strain of left quadriceps muscle, fascia and tendon, subsequent encounter: Secondary | ICD-10-CM | POA: Diagnosis not present

## 2015-12-16 DIAGNOSIS — S76112D Strain of left quadriceps muscle, fascia and tendon, subsequent encounter: Secondary | ICD-10-CM | POA: Diagnosis not present

## 2015-12-17 DIAGNOSIS — Z4789 Encounter for other orthopedic aftercare: Secondary | ICD-10-CM | POA: Diagnosis not present

## 2015-12-17 DIAGNOSIS — S76112D Strain of left quadriceps muscle, fascia and tendon, subsequent encounter: Secondary | ICD-10-CM | POA: Diagnosis not present

## 2015-12-23 ENCOUNTER — Ambulatory Visit (INDEPENDENT_AMBULATORY_CARE_PROVIDER_SITE_OTHER): Payer: BLUE CROSS/BLUE SHIELD | Admitting: Physician Assistant

## 2015-12-23 VITALS — BP 128/80 | HR 55 | Temp 97.7°F | Resp 16 | Ht 72.0 in | Wt 214.0 lb

## 2015-12-23 DIAGNOSIS — I10 Essential (primary) hypertension: Secondary | ICD-10-CM

## 2015-12-23 DIAGNOSIS — Z23 Encounter for immunization: Secondary | ICD-10-CM | POA: Diagnosis not present

## 2015-12-23 DIAGNOSIS — Z85850 Personal history of malignant neoplasm of thyroid: Secondary | ICD-10-CM

## 2015-12-23 DIAGNOSIS — E890 Postprocedural hypothyroidism: Secondary | ICD-10-CM

## 2015-12-23 LAB — CBC WITH DIFFERENTIAL/PLATELET
BASOS ABS: 37 {cells}/uL (ref 0–200)
Basophils Relative: 1 %
Eosinophils Absolute: 148 cells/uL (ref 15–500)
Eosinophils Relative: 4 %
HEMATOCRIT: 42.2 % (ref 38.5–50.0)
HEMOGLOBIN: 13.8 g/dL (ref 13.2–17.1)
LYMPHS PCT: 43 %
Lymphs Abs: 1591 cells/uL (ref 850–3900)
MCH: 26.8 pg — AB (ref 27.0–33.0)
MCHC: 32.7 g/dL (ref 32.0–36.0)
MCV: 81.9 fL (ref 80.0–100.0)
MONO ABS: 444 {cells}/uL (ref 200–950)
MPV: 9.9 fL (ref 7.5–12.5)
Monocytes Relative: 12 %
NEUTROS PCT: 40 %
Neutro Abs: 1480 cells/uL — ABNORMAL LOW (ref 1500–7800)
Platelets: 155 10*3/uL (ref 140–400)
RBC: 5.15 MIL/uL (ref 4.20–5.80)
RDW: 15.6 % — AB (ref 11.0–15.0)
WBC: 3.7 10*3/uL — AB (ref 3.8–10.8)

## 2015-12-23 LAB — COMPLETE METABOLIC PANEL WITH GFR
ALBUMIN: 4.1 g/dL (ref 3.6–5.1)
ALK PHOS: 55 U/L (ref 40–115)
ALT: 18 U/L (ref 9–46)
AST: 23 U/L (ref 10–35)
BUN: 20 mg/dL (ref 7–25)
CALCIUM: 9.1 mg/dL (ref 8.6–10.3)
CO2: 27 mmol/L (ref 20–31)
Chloride: 103 mmol/L (ref 98–110)
Creat: 1.14 mg/dL (ref 0.70–1.25)
GFR, EST AFRICAN AMERICAN: 79 mL/min (ref >=60)
GFR, EST NON AFRICAN AMERICAN: 69 mL/min (ref >=60)
Glucose, Bld: 79 mg/dL (ref 65–99)
POTASSIUM: 5 mmol/L (ref 3.5–5.3)
Sodium: 137 mmol/L (ref 135–146)
Total Bilirubin: 1.1 mg/dL (ref 0.2–1.2)
Total Protein: 6.8 g/dL (ref 6.1–8.1)

## 2015-12-23 LAB — TSH: TSH: 3.83 m[IU]/L (ref 0.40–4.50)

## 2015-12-23 MED ORDER — MELOXICAM 7.5 MG PO TABS: 7.5000 mg | ORAL_TABLET | Freq: Every day | ORAL | 1 refills | Status: DC

## 2015-12-23 MED ORDER — LEVOTHYROXINE SODIUM 150 MCG PO TABS: 150.0000 ug | ORAL_TABLET | Freq: Every day | ORAL | 1 refills | Status: DC

## 2015-12-23 MED ORDER — AMLODIPINE BESYLATE 5 MG PO TABS: ORAL_TABLET | ORAL | 1 refills | Status: DC

## 2015-12-23 NOTE — Patient Instructions (Addendum)
I'll have your lab results within the next 2 weeks.     IF you received an x-ray today, you will receive an invoice from [facility]. Please contact [facility] Radiology at [phone] with questions or concerns regarding your invoice.   IF you received labwork today, you will receive an invoice from [employer]. Please contact [facility] at [phone] with questions or concerns regarding your invoice.   Our billing staff will not be able to assist you with questions regarding bills from these companies.  You will be contacted with the lab results as soon as they are available. The fastest way to get your results is to activate your My Chart account. Instructions are located on the last page of this paperwork. If you have not heard from [place] regarding the results in 2 weeks, please contact this office.

## 2015-12-23 NOTE — Progress Notes (Addendum)
Patient ID: Jerry Freeman, male   DOB: April 12, 1955, 60 y.o.   MRN: [id number] [facility] [address], [place] [address] [phone]  By signing my name below, I, Essence Howell, attest that this documentation has been prepared under the direction and in the presence of Ivar Drape, PA-C Electronically Signed: Ladene Artist, ED Scribe 12/23/2015 at 10:29 AM.   Date:  12/23/2015   Name:  Jerry Freeman   DOB:  07-Apr-1955   MRN:  [id number]  PCP:  No primary care provider on file.    History of Present Illness:  Jerry Freeman is a 60 y.o. male patient who presents to [facility] for a follow-up on HTN. Pt's triage BP was 128/80. Pt does not check his BP at home. Pt states that he is cutting back on desserts. He has returned to exercising but not golfing yet due to left knee pain status post surgery.  Followed by ortho, and physical therapy.  He will be working on conditioning in the next 2 weeks with PT.  He denies leg swelling, chest pain, palpitations, sob and cough. Pt states that he stopped taking Prilosec and has not noticed any complications.   Patient Active Problem List   Diagnosis Date Noted   DDD (degenerative disc disease), lumbar 11/20/2013   Allergic rhinitis 12/11/2011   Family history of colon cancer 12/11/2011   Cancer of thyroid ([place]) 12/11/2011   Hypothyroidism 08/16/2011   ED (erectile dysfunction) 08/16/2011    Past Medical History:  Diagnosis Date   Arthritis    Cancer ([place])    Hypothyroidism    Seasonal allergies     Past Surgical History:  Procedure Laterality Date   HERNIA REPAIR     13 yrs ago   rupture quad tenden     THYROIDECTOMY     4 or 5 yrs ago    Social History  Substance Use Topics   Smoking status: Current Every Day Smoker    Years: 18.00    Types: Cigarettes    Last attempt to quit: 12/15/1991   Smokeless tobacco: Never Used     Comment: also smoked marijuana,and cocaine (free-base)    Alcohol  use No    Family History  Problem Relation Age of Onset   Cancer Mother     lung   Cancer Father     rectal   Kidney disease Maternal Grandmother     dialysis   Hypertension Maternal Grandmother    Arthritis Maternal Grandfather     No Known Allergies  Medication list has been reviewed and updated.  Current Outpatient Prescriptions on File Prior to Visit  Medication Sig Dispense Refill   amLODipine (NORVASC) 5 MG tablet TAKE 1 TABLET DAILY 90 tablet 1   calcium carbonate 1250 MG capsule Take 1,250 mg by mouth 2 (two) times daily with a meal. Per pt it is citracal plus D     cetirizine (ZYRTEC) 10 MG tablet Take 1 tablet (10 mg total) by mouth daily. 90 tablet 3   Krill Oil (MAXIMUM RED KRILL PO) Take by mouth.     levothyroxine (SYNTHROID, LEVOTHROID) 150 MCG tablet Take 1 tablet (150 mcg total) by mouth daily. 90 tablet 1   meloxicam (MOBIC) 15 MG tablet Take 1 tablet (15 mg total) by mouth daily. X 2weeks then prn pain 90 tablet 3   Multiple Vitamins-Minerals (MEGA MULTI MEN PO) Take by mouth daily. GNC 50 PLUS     saw palmetto 160 MG  capsule Take 160 mg by mouth daily.     sildenafil (VIAGRA) 100 MG tablet TAKE 1 TABLET DAILY AS NEEDED FOR ERECTILE DYSFUNCTION 22 tablet 3   Triamcinolone Acetonide (TRIAMCINOLONE 0.1 % CREAM : EUCERIN) CREA Apply 1 application topically 2 (two) times daily. No longer than 2 weeks 1 each 0   vitamin C (ASCORBIC ACID) 500 MG tablet Take 500 mg by mouth daily.      zoster vaccine live, PF, (ZOSTAVAX) 16109 UNT/0.65ML injection Inject 19,400 Units into the skin once. 1 each 0   No current facility-administered medications on file prior to visit.     Review of Systems  Respiratory: Negative for cough and shortness of breath.   Cardiovascular: Negative for chest pain, palpitations and leg swelling.    Physical Examination: BP 128/80 (BP Location: Right Arm, Patient Position: Sitting, Cuff Size: Large)    Pulse (!) 55    Temp  97.7 F (36.5 C) (Oral)    Resp 16    Ht 6' (1.829 m)    Wt 214 lb (97.1 kg)    SpO2 99%    BMI 29.02 kg/m  Ideal Body Weight: @FLOWAMB [id number]  Physical Exam  Constitutional: He is oriented to person, place, and time. He appears well-developed and well-nourished. No distress.  HENT:  Head: Normocephalic and atraumatic.  Eyes: Conjunctivae and EOM are normal. Pupils are equal, round, and reactive to light.  Cardiovascular: Normal rate.   Pulmonary/Chest: Effort normal. No respiratory distress.  Neurological: He is alert and oriented to person, place, and time.  Skin: Skin is warm and dry. He is not diaphoretic.  Psychiatric: He has a normal mood and affect. His behavior is normal.    Assessment and Plan: Jerry Freeman is a 60 y.o. male who is here today for follow up of bp and thyroid.   --will follow up regarding results.  Essential hypertension - Plan: CBC with Differential/Platelet, COMPLETE METABOLIC PANEL WITH GFR, amLODipine (NORVASC) 5 MG tablet  Hx of thyroid cancer - Plan: CBC with Differential/Platelet, TSH  Postoperative hypothyroidism - Plan: levothyroxine (SYNTHROID, LEVOTHROID) 150 MCG tablet  Need for prophylactic vaccination and inoculation against influenza - Plan: Flu Vaccine QUAD 36+ mos IM  Ivar Drape, PA-C [facility] 10/11/20177:44 AM  I personally performed the services described in this documentation, which was scribed in my presence. The recorded information has been reviewed and is accurate.

## 2015-12-24 MED ORDER — CETIRIZINE HCL 10 MG PO TABS: 10.0000 mg | ORAL_TABLET | Freq: Every day | ORAL | 3 refills | Status: DC

## 2015-12-29 DIAGNOSIS — S76112D Strain of left quadriceps muscle, fascia and tendon, subsequent encounter: Secondary | ICD-10-CM | POA: Diagnosis not present

## 2015-12-30 DIAGNOSIS — S76112D Strain of left quadriceps muscle, fascia and tendon, subsequent encounter: Secondary | ICD-10-CM | POA: Diagnosis not present

## 2015-12-31 DIAGNOSIS — S76112D Strain of left quadriceps muscle, fascia and tendon, subsequent encounter: Secondary | ICD-10-CM | POA: Diagnosis not present

## 2016-01-01 DIAGNOSIS — S76112D Strain of left quadriceps muscle, fascia and tendon, subsequent encounter: Secondary | ICD-10-CM | POA: Diagnosis not present

## 2016-01-05 DIAGNOSIS — S76112D Strain of left quadriceps muscle, fascia and tendon, subsequent encounter: Secondary | ICD-10-CM | POA: Diagnosis not present

## 2016-01-06 DIAGNOSIS — S76112D Strain of left quadriceps muscle, fascia and tendon, subsequent encounter: Secondary | ICD-10-CM | POA: Diagnosis not present

## 2016-01-07 DIAGNOSIS — S76112D Strain of left quadriceps muscle, fascia and tendon, subsequent encounter: Secondary | ICD-10-CM | POA: Diagnosis not present

## 2016-01-08 DIAGNOSIS — S76112D Strain of left quadriceps muscle, fascia and tendon, subsequent encounter: Secondary | ICD-10-CM | POA: Diagnosis not present

## 2016-01-12 ENCOUNTER — Telehealth: Payer: Self-pay

## 2016-01-12 ENCOUNTER — Telehealth: Payer: Self-pay | Admitting: Emergency Medicine

## 2016-01-12 DIAGNOSIS — S76112D Strain of left quadriceps muscle, fascia and tendon, subsequent encounter: Secondary | ICD-10-CM | POA: Diagnosis not present

## 2016-01-12 NOTE — Telephone Encounter (Signed)
Pt is calling regarding his labs from 10/10 and he also needs a medication refilled which is why he had the blood work done  Medication is levothryoxine (synthroid, levothroid)   Contact: [phone]

## 2016-01-12 NOTE — Telephone Encounter (Signed)
TSH level is normal but results not posted. Pt is waiting to hear from [place] before Rf Levothyroxine 183mcg Please f/u

## 2016-01-13 ENCOUNTER — Other Ambulatory Visit: Payer: Self-pay | Admitting: Physician Assistant

## 2016-01-13 DIAGNOSIS — E039 Hypothyroidism, unspecified: Secondary | ICD-10-CM

## 2016-01-13 DIAGNOSIS — S76112D Strain of left quadriceps muscle, fascia and tendon, subsequent encounter: Secondary | ICD-10-CM | POA: Diagnosis not present

## 2016-01-15 DIAGNOSIS — S76112D Strain of left quadriceps muscle, fascia and tendon, subsequent encounter: Secondary | ICD-10-CM | POA: Diagnosis not present

## 2016-01-16 DIAGNOSIS — M1712 Unilateral primary osteoarthritis, left knee: Secondary | ICD-10-CM | POA: Diagnosis not present

## 2016-01-16 DIAGNOSIS — S76112D Strain of left quadriceps muscle, fascia and tendon, subsequent encounter: Secondary | ICD-10-CM | POA: Diagnosis not present

## 2016-01-16 DIAGNOSIS — Z4789 Encounter for other orthopedic aftercare: Secondary | ICD-10-CM | POA: Diagnosis not present

## 2016-01-19 DIAGNOSIS — S76112D Strain of left quadriceps muscle, fascia and tendon, subsequent encounter: Secondary | ICD-10-CM | POA: Diagnosis not present

## 2016-01-20 DIAGNOSIS — S76112D Strain of left quadriceps muscle, fascia and tendon, subsequent encounter: Secondary | ICD-10-CM | POA: Diagnosis not present

## 2016-01-22 DIAGNOSIS — S76112D Strain of left quadriceps muscle, fascia and tendon, subsequent encounter: Secondary | ICD-10-CM | POA: Diagnosis not present

## 2016-01-23 DIAGNOSIS — S76112D Strain of left quadriceps muscle, fascia and tendon, subsequent encounter: Secondary | ICD-10-CM | POA: Diagnosis not present

## 2016-01-26 DIAGNOSIS — S76112D Strain of left quadriceps muscle, fascia and tendon, subsequent encounter: Secondary | ICD-10-CM | POA: Diagnosis not present

## 2016-01-27 ENCOUNTER — Telehealth: Payer: Self-pay

## 2016-01-27 DIAGNOSIS — S76112D Strain of left quadriceps muscle, fascia and tendon, subsequent encounter: Secondary | ICD-10-CM | POA: Diagnosis not present

## 2016-01-27 NOTE — Telephone Encounter (Signed)
I donot see a current reflux med on list

## 2016-01-27 NOTE — Telephone Encounter (Signed)
Patient is calling to check on a prescription refill.  [facility] a request and is checking on the status of that.  Jerry Freeman English is his primary care and states that the prescription is for acid reflux.  Be on the lookout for a fax [facility].  [phone]

## 2016-01-28 ENCOUNTER — Other Ambulatory Visit: Payer: Self-pay

## 2016-01-28 DIAGNOSIS — K219 Gastro-esophageal reflux disease without esophagitis: Secondary | ICD-10-CM

## 2016-01-28 NOTE — Telephone Encounter (Addendum)
Jerry Freeman, you saw pt for check up in Oct and I don't see omeprazole on med list. It was on list at his CPE w/ you Jan 2017, and last Rxd by Dr Leward Quan in 2015. Do you want pt to RTC to discuss? Or give RFs? Pended as last Rxd.  I see that pt called (telephone message) yesterday to check on this, so he does want a Rx for it.

## 2016-01-29 DIAGNOSIS — S76112D Strain of left quadriceps muscle, fascia and tendon, subsequent encounter: Secondary | ICD-10-CM | POA: Diagnosis not present

## 2016-01-29 MED ORDER — OMEPRAZOLE 20 MG PO CPDR: 20.0000 mg | DELAYED_RELEASE_CAPSULE | Freq: Every day | ORAL | 0 refills | Status: DC

## 2016-01-29 NOTE — Telephone Encounter (Signed)
I sent a refill for 90 days which should help him make it to his next annual physical exam. He can discuss more refills at that point or come in for an office visit. Thank you, Pamala Hurry!

## 2016-01-29 NOTE — Telephone Encounter (Signed)
I have forwarded a refill req on to [place] for review.

## 2016-01-30 DIAGNOSIS — S76112D Strain of left quadriceps muscle, fascia and tendon, subsequent encounter: Secondary | ICD-10-CM | POA: Diagnosis not present

## 2016-02-02 DIAGNOSIS — S76112D Strain of left quadriceps muscle, fascia and tendon, subsequent encounter: Secondary | ICD-10-CM | POA: Diagnosis not present

## 2016-02-03 DIAGNOSIS — S76112D Strain of left quadriceps muscle, fascia and tendon, subsequent encounter: Secondary | ICD-10-CM | POA: Diagnosis not present

## 2016-07-02 ENCOUNTER — Ambulatory Visit (INDEPENDENT_AMBULATORY_CARE_PROVIDER_SITE_OTHER): Payer: BLUE CROSS/BLUE SHIELD | Admitting: Physician Assistant

## 2016-07-02 VITALS — BP 132/84 | HR 60 | Temp 97.7°F | Resp 18 | Ht 72.0 in | Wt 211.6 lb

## 2016-07-02 DIAGNOSIS — E890 Postprocedural hypothyroidism: Secondary | ICD-10-CM

## 2016-07-02 DIAGNOSIS — Z13220 Encounter for screening for lipoid disorders: Secondary | ICD-10-CM

## 2016-07-02 DIAGNOSIS — J302 Other seasonal allergic rhinitis: Secondary | ICD-10-CM

## 2016-07-02 DIAGNOSIS — Z0000 Encounter for general adult medical examination without abnormal findings: Secondary | ICD-10-CM

## 2016-07-02 DIAGNOSIS — Z1321 Encounter for screening for nutritional disorder: Secondary | ICD-10-CM | POA: Diagnosis not present

## 2016-07-02 DIAGNOSIS — Z125 Encounter for screening for malignant neoplasm of prostate: Secondary | ICD-10-CM | POA: Diagnosis not present

## 2016-07-02 DIAGNOSIS — Z131 Encounter for screening for diabetes mellitus: Secondary | ICD-10-CM | POA: Diagnosis not present

## 2016-07-02 DIAGNOSIS — Z13228 Encounter for screening for other metabolic disorders: Secondary | ICD-10-CM | POA: Diagnosis not present

## 2016-07-02 MED ORDER — OLOPATADINE HCL 0.1 % OP SOLN: 1.0000 [drp] | Freq: Two times a day (BID) | OPHTHALMIC | 12 refills | Status: DC

## 2016-07-02 NOTE — Patient Instructions (Addendum)
Hydrate well with 64 oz of water each day. Get that colonoscopy.  If you need me to do anything, let me know.  Keeping you healthy  Get these tests  Blood pressure- Have your blood pressure checked once a year by your healthcare provider.  Normal blood pressure is 120/80  Weight- Have your body mass index (BMI) calculated to screen for obesity.  BMI is a measure of body fat based on height and weight. You can also calculate your own BMI at ViewBanking.si.  Cholesterol- Have your cholesterol checked every year.  Diabetes- Have your blood sugar checked regularly if you have high blood pressure, high cholesterol, have a family history of diabetes or if you are overweight.  Screening for Colon Cancer- Colonoscopy starting at age 51.  Screening may begin sooner depending on your family history and other health conditions. Follow up colonoscopy as directed by your Gastroenterologist.  Screening for Prostate Cancer- Both blood work (PSA) and a rectal exam help screen for Prostate Cancer.  Screening begins at age 108 with African-American men and at age 8 with Caucasian men.  Screening may begin sooner depending on your family history.  Take these medicines  Aspirin- One aspirin daily can help prevent Heart disease and Stroke.  Flu shot- Every fall.  Tetanus- Every 10 years.  Zostavax- Once after the age of 49 to prevent Shingles.  Pneumonia shot- Once after the age of 63; if you are younger than 57, ask your healthcare provider if you need a Pneumonia shot.  Take these steps  Don't smoke- If you do smoke, talk to your doctor about quitting.  For tips on how to quit, go to www.smokefree.gov or call 1-800-QUIT-NOW.  Be physically active- Exercise 5 days a week for at least 30 minutes.  If you are not already physically active start slow and gradually work up to 30 minutes of moderate physical activity.  Examples of moderate activity include walking briskly, mowing the yard,  dancing, swimming, bicycling, etc.  Eat a healthy diet- Eat a variety of healthy food such as fruits, vegetables, low fat milk, low fat cheese, yogurt, lean meant, poultry, fish, beans, tofu, etc. For more information go to www.thenutritionsource.org  Drink alcohol in moderation- Limit alcohol intake to less than two drinks a day. Never drink and drive.  Dentist- Brush and floss twice daily; visit your dentist twice a year.  Depression- Your emotional health is as important as your physical health. If you're feeling down, or losing interest in things you would normally enjoy please talk to your healthcare provider.  Eye exam- Visit your eye doctor every year.  Safe sex- If you may be exposed to a sexually transmitted infection, use a condom.  Seat belts- Seat belts can save your life; always wear one.  Smoke/Carbon Monoxide detectors- These detectors need to be installed on the appropriate level of your home.  Replace batteries at least once a year.  Skin cancer- When out in the sun, cover up and use sunscreen 15 SPF or higher.  Violence- If anyone is threatening you, please tell your healthcare provider.  Living Will/ Health care power of attorney- Speak with your healthcare provider and family.   IF you received an x-ray today, you will receive an invoice from [facility] Radiology. Please contact [facility] Radiology at [phone] with questions or concerns regarding your invoice.   IF you received labwork today, you will receive an invoice from [place]. Please contact [facility] at [phone] with questions or concerns regarding your invoice.  Our billing staff will not be able to assist you with questions regarding bills from these companies.  You will be contacted with the lab results as soon as they are available. The fastest way to get your results is to activate your My Chart account. Instructions are located on the last page of this paperwork. If you have not heard from [place]  regarding the results in 2 weeks, please contact this office.

## 2016-07-02 NOTE — Progress Notes (Signed)
PRIMARY CARE AT [place], [place] [address] [phone]  Date:  07/02/2016   Name:  Jerry Freeman   DOB:  [birth year]   MRN:  [id number]  PCP:  Ivar Drape, PA    History of Present Illness:  Jerry Freeman is a 61 y.o. male patient who presents to PCP with  Chief Complaint  Patient presents with  . Annual Exam  . Medication Refill    all meds      DIET: eating good. Smoothie with breakfast.  Baked or grilled chicken.  Grilled chicken salads.  Raisin brans, and will get foods from [employer], meatloaf.  He eats a lot of beef and chicken.  BM: regular.  No black or bloody stool  URINATION: No pain with urination.  Weakened stream, but more with good hydration.  No hematuria  SLEEP: Erratic due to schedule, but sleeps well.    SOCIAL ACTIVITY: He golfs in spare time.    Exercise: working out 2x per weekend.    Wt Readings from Last 3 Encounters:  07/02/16 211 lb 9.6 oz (96 kg)  12/23/15 214 lb (97.1 kg)  08/05/15 206 lb (93.4 kg)    Patient Active Problem List   Diagnosis Date Noted  . DDD (degenerative disc disease), lumbar 11/20/2013  . Allergic rhinitis 12/11/2011  . Family history of colon cancer 12/11/2011  . Cancer of thyroid ([place]) 12/11/2011  . Hypothyroidism 08/16/2011  . ED (erectile dysfunction) 08/16/2011    Past Medical History:  Diagnosis Date  . Arthritis   . Cancer ([place])   . Hypothyroidism   . Seasonal allergies     Past Surgical History:  Procedure Laterality Date  . HERNIA REPAIR     13 yrs ago  . rupture quad tenden    . THYROIDECTOMY     4 or 5 yrs ago    Social History  Substance Use Topics  . Smoking status: Current Every Day Smoker    Years: 18.00    Types: Cigarettes    Last attempt to quit: 12/15/1991  . Smokeless tobacco: Never Used     Comment: also smoked marijuana,and cocaine (free-base)   . Alcohol use No    Family History  Problem Relation Age of Onset  . Cancer Mother     lung  . Cancer Father      rectal  . Kidney disease Maternal Grandmother     dialysis  . Hypertension Maternal Grandmother   . Hyperlipidemia Maternal Grandmother   . Arthritis Maternal Grandfather     No Known Allergies  Medication list has been reviewed and updated.  Current Outpatient Prescriptions on File Prior to Visit  Medication Sig Dispense Refill  . amLODipine (NORVASC) 5 MG tablet TAKE 1 TABLET DAILY 90 tablet 1  . calcium carbonate 1250 MG capsule Take 1,250 mg by mouth 2 (two) times daily with a meal. Per pt it is citracal plus D    . cetirizine (ZYRTEC) 10 MG tablet Take 1 tablet (10 mg total) by mouth daily. 90 tablet 3  . Krill Oil (MAXIMUM RED KRILL PO) Take by mouth.    . levothyroxine (SYNTHROID, LEVOTHROID) 150 MCG tablet Take 1 tablet (150 mcg total) by mouth daily. 90 tablet 1  . meloxicam (MOBIC) 15 MG tablet Take 1 tablet (15 mg total) by mouth daily. X 2weeks then prn pain 90 tablet 3  . meloxicam (MOBIC) 7.5 MG tablet Take 1 tablet (7.5 mg total) by mouth daily. 30 tablet  1  . Multiple Vitamins-Minerals (MEGA MULTI MEN PO) Take by mouth daily. GNC 50 PLUS    . omeprazole (PRILOSEC) 20 MG capsule Take 1 capsule (20 mg total) by mouth daily. 90 capsule 0  . sildenafil (VIAGRA) 100 MG tablet TAKE 1 TABLET DAILY AS NEEDED FOR ERECTILE DYSFUNCTION 22 tablet 3  . Triamcinolone Acetonide (TRIAMCINOLONE 0.1 % CREAM : EUCERIN) CREA Apply 1 application topically 2 (two) times daily. No longer than 2 weeks 1 each 0  . vitamin C (ASCORBIC ACID) 500 MG tablet Take 500 mg by mouth daily.     Marland Kitchen zoster vaccine live, PF, (ZOSTAVAX) 16109 UNT/0.65ML injection Inject 19,400 Units into the skin once. 1 each 0  . saw palmetto 160 MG capsule Take 160 mg by mouth daily.     No current facility-administered medications on file prior to visit.     ROS ROS otherwise unremarkable unless listed above.  Physical Examination: BP (!) 148/85   Pulse 60   Temp 97.7 F (36.5 C) (Oral)   Resp 18   Ht 6'  (1.829 m)   Wt 211 lb 9.6 oz (96 kg)   SpO2 99%   BMI 28.70 kg/m  Ideal Body Weight: Weight in (lb) to have BMI = 25: 183.9  Physical Exam  Constitutional: He is oriented to person, place, and time. He appears well-developed and well-nourished. No distress.  HENT:  Head: Normocephalic and atraumatic.  Right Ear: Tympanic membrane, external ear and ear canal normal.  Left Ear: Tympanic membrane, external ear and ear canal normal.  Lateral lower eye lid with non-erythematous papular lesions bilaterally  Eyes: Conjunctivae and EOM are normal. Pupils are equal, round, and reactive to light.  Cardiovascular: Normal rate and regular rhythm.  Exam reveals no friction rub.   No murmur heard. Pulses:      Carotid pulses are 2+ on the right side, and 2+ on the left side.      Radial pulses are 2+ on the right side, and 2+ on the left side.       Dorsalis pedis pulses are 2+ on the right side, and 2+ on the left side.  Pulmonary/Chest: Effort normal. No respiratory distress. He has no wheezes.  Abdominal: Soft. Bowel sounds are normal. He exhibits no distension and no mass. There is no tenderness.  Musculoskeletal: Normal range of motion. He exhibits no edema or tenderness.  Neurological: He is alert and oriented to person, place, and time. He displays normal reflexes.  Skin: Skin is warm and dry. He is not diaphoretic.  Psychiatric: He has a normal mood and affect. His behavior is normal.     Assessment and Plan: Jerry Freeman is a 61 y.o. male who is here today for annual physical exam. Annual physical exam - Plan: CBC, Basic metabolic panel, TSH, Lipid panel, VITAMIN D 25 Hydroxy (Vit-D Deficiency, Fractures), PSA  Screening for diabetes mellitus - Plan: Basic metabolic panel  Screening for lipid disorders - Plan: Lipid panel  Encounter for vitamin deficiency screening - Plan: VITAMIN D 25 Hydroxy (Vit-D Deficiency, Fractures)  Screening for metabolic disorder - Plan: Basic metabolic  panel  Screening for prostate cancer - Plan: PSA  Ivar Drape, PA-C [facility] 4/22/20187:44 AM

## 2016-07-03 LAB — LIPID PANEL
CHOL/HDL RATIO: 2.8 ratio (ref 0.0–5.0)
Cholesterol, Total: 175 mg/dL (ref 100–199)
HDL: 62 mg/dL (ref >39)
LDL CALC: 94 mg/dL (ref 0–99)
Triglycerides: 96 mg/dL (ref 0–149)
VLDL CHOLESTEROL CAL: 19 mg/dL (ref 5–40)

## 2016-07-03 LAB — BASIC METABOLIC PANEL WITH GFR
BUN/Creatinine Ratio: 15 (ref 10–24)
BUN: 16 mg/dL (ref 8–27)
CO2: 25 mmol/L (ref 18–29)
Calcium: 9.1 mg/dL (ref 8.6–10.2)
Chloride: 100 mmol/L (ref 96–106)
Creatinine, Ser: 1.1 mg/dL (ref 0.76–1.27)
GFR calc Af Amer: 82 mL/min/1.73
GFR calc non Af Amer: 71 mL/min/1.73
Glucose: 85 mg/dL (ref 65–99)
Potassium: 4.4 mmol/L (ref 3.5–5.2)
Sodium: 141 mmol/L (ref 134–144)

## 2016-07-03 LAB — CBC
Hematocrit: 41 % (ref 37.5–51.0)
Hemoglobin: 13.4 g/dL (ref 13.0–17.7)
MCH: 26.5 pg — ABNORMAL LOW (ref 26.6–33.0)
MCHC: 32.7 g/dL (ref 31.5–35.7)
MCV: 81 fL (ref 79–97)
Platelets: 170 x10E3/uL (ref 150–379)
RBC: 5.05 x10E6/uL (ref 4.14–5.80)
RDW: 15.5 % — ABNORMAL HIGH (ref 12.3–15.4)
WBC: 4.1 x10E3/uL (ref 3.4–10.8)

## 2016-07-03 LAB — VITAMIN D 25 HYDROXY (VIT D DEFICIENCY, FRACTURES): VIT D 25 HYDROXY: 26.5 ng/mL — AB (ref 30.0–100.0)

## 2016-07-03 LAB — PSA: PROSTATE SPECIFIC AG, SERUM: 0.7 ng/mL (ref 0.0–4.0)

## 2016-07-03 LAB — TSH: TSH: 0.971 u[IU]/mL (ref 0.450–4.500)

## 2016-07-06 ENCOUNTER — Telehealth: Payer: Self-pay | Admitting: Physician Assistant

## 2016-07-06 NOTE — Telephone Encounter (Signed)
Pt is looking for lab results so that a medication can be called in   Best number [phone]

## 2016-07-07 ENCOUNTER — Other Ambulatory Visit: Payer: Self-pay | Admitting: Physician Assistant

## 2016-07-07 DIAGNOSIS — I10 Essential (primary) hypertension: Secondary | ICD-10-CM

## 2016-07-07 DIAGNOSIS — E890 Postprocedural hypothyroidism: Secondary | ICD-10-CM

## 2016-07-07 DIAGNOSIS — K219 Gastro-esophageal reflux disease without esophagitis: Secondary | ICD-10-CM

## 2016-07-07 MED ORDER — AMLODIPINE BESYLATE 5 MG PO TABS: ORAL_TABLET | ORAL | 1 refills | Status: DC

## 2016-07-07 MED ORDER — OMEPRAZOLE 20 MG PO CPDR: 20.0000 mg | DELAYED_RELEASE_CAPSULE | Freq: Every day | ORAL | 1 refills | Status: DC

## 2016-07-07 MED ORDER — CETIRIZINE HCL 10 MG PO TABS: 10.0000 mg | ORAL_TABLET | Freq: Every day | ORAL | 3 refills | Status: DC

## 2016-07-07 MED ORDER — LEVOTHYROXINE SODIUM 150 MCG PO TABS: 150.0000 ug | ORAL_TABLET | Freq: Every day | ORAL | 1 refills | Status: DC

## 2016-07-07 NOTE — Telephone Encounter (Signed)
Please see results and advise.

## 2016-07-16 ENCOUNTER — Other Ambulatory Visit: Payer: Self-pay | Admitting: Physician Assistant

## 2016-07-16 DIAGNOSIS — E559 Vitamin D deficiency, unspecified: Secondary | ICD-10-CM

## 2016-07-16 MED ORDER — ERGOCALCIFEROL 1.25 MG (50000 UT) PO CAPS: 50000.0000 [IU] | ORAL_CAPSULE | ORAL | 0 refills | Status: DC

## 2016-07-27 DIAGNOSIS — L821 Other seborrheic keratosis: Secondary | ICD-10-CM | POA: Diagnosis not present

## 2016-07-27 DIAGNOSIS — L280 Lichen simplex chronicus: Secondary | ICD-10-CM | POA: Diagnosis not present

## 2016-07-27 DIAGNOSIS — L858 Other specified epidermal thickening: Secondary | ICD-10-CM | POA: Diagnosis not present

## 2016-09-14 ENCOUNTER — Ambulatory Visit: Payer: BLUE CROSS/BLUE SHIELD | Admitting: Physician Assistant

## 2016-09-14 DIAGNOSIS — E559 Vitamin D deficiency, unspecified: Secondary | ICD-10-CM | POA: Diagnosis not present

## 2016-09-15 LAB — VITAMIN D 25 HYDROXY (VIT D DEFICIENCY, FRACTURES): VIT D 25 HYDROXY: 38.4 ng/mL (ref 30.0–100.0)

## 2016-09-22 DIAGNOSIS — Z1211 Encounter for screening for malignant neoplasm of colon: Secondary | ICD-10-CM | POA: Diagnosis not present

## 2016-10-04 ENCOUNTER — Other Ambulatory Visit: Payer: Self-pay | Admitting: Physician Assistant

## 2017-01-20 ENCOUNTER — Other Ambulatory Visit: Payer: Self-pay

## 2017-01-20 ENCOUNTER — Ambulatory Visit (INDEPENDENT_AMBULATORY_CARE_PROVIDER_SITE_OTHER): Payer: BLUE CROSS/BLUE SHIELD | Admitting: Urgent Care

## 2017-01-20 ENCOUNTER — Encounter: Payer: Self-pay | Admitting: Urgent Care

## 2017-01-20 VITALS — BP 138/80 | HR 62 | Temp 98.3°F | Resp 16 | Ht 72.0 in | Wt 211.0 lb

## 2017-01-20 DIAGNOSIS — E890 Postprocedural hypothyroidism: Secondary | ICD-10-CM

## 2017-01-20 DIAGNOSIS — I10 Essential (primary) hypertension: Secondary | ICD-10-CM | POA: Diagnosis not present

## 2017-01-20 DIAGNOSIS — Z85850 Personal history of malignant neoplasm of thyroid: Secondary | ICD-10-CM | POA: Diagnosis not present

## 2017-01-20 DIAGNOSIS — Z9109 Other allergy status, other than to drugs and biological substances: Secondary | ICD-10-CM | POA: Diagnosis not present

## 2017-01-20 DIAGNOSIS — N529 Male erectile dysfunction, unspecified: Secondary | ICD-10-CM

## 2017-01-20 DIAGNOSIS — Z23 Encounter for immunization: Secondary | ICD-10-CM | POA: Diagnosis not present

## 2017-01-20 DIAGNOSIS — R12 Heartburn: Secondary | ICD-10-CM

## 2017-01-20 DIAGNOSIS — M1711 Unilateral primary osteoarthritis, right knee: Secondary | ICD-10-CM | POA: Diagnosis not present

## 2017-01-20 MED ORDER — CETIRIZINE HCL 10 MG PO TABS: 10.0000 mg | ORAL_TABLET | Freq: Every day | ORAL | 3 refills | Status: DC

## 2017-01-20 MED ORDER — MELOXICAM 7.5 MG PO TABS: 7.5000 mg | ORAL_TABLET | Freq: Every day | ORAL | 5 refills | Status: DC

## 2017-01-20 MED ORDER — OMEPRAZOLE 20 MG PO CPDR: 20.0000 mg | DELAYED_RELEASE_CAPSULE | Freq: Every day | ORAL | 3 refills | Status: DC

## 2017-01-20 MED ORDER — AMLODIPINE BESYLATE 5 MG PO TABS: ORAL_TABLET | ORAL | 3 refills | Status: DC

## 2017-01-20 MED ORDER — SILDENAFIL CITRATE 100 MG PO TABS: ORAL_TABLET | ORAL | 5 refills | Status: DC

## 2017-01-20 NOTE — Patient Instructions (Addendum)
Hypertension Hypertension, commonly called high blood pressure, is when the force of blood pumping through the arteries is too strong. The arteries are the blood vessels that carry blood from the heart throughout the body. Hypertension forces the heart to work harder to pump blood and may cause arteries to become narrow or stiff. Having untreated or uncontrolled hypertension can cause heart attacks, strokes, kidney disease, and other problems. A blood pressure reading consists of a higher number over a lower number. Ideally, your blood pressure should be below 120/80. The first ("top") number is called the systolic pressure. It is a measure of the pressure in your arteries as your heart beats. The second ("bottom") number is called the diastolic pressure. It is a measure of the pressure in your arteries as the heart relaxes. What are the causes? The cause of this condition is not known. What increases the risk? Some risk factors for high blood pressure are under your control. Others are not. Factors you can change  Smoking.  Having type 2 diabetes mellitus, high cholesterol, or both.  Not getting enough exercise or physical activity.  Being overweight.  Having too much fat, sugar, calories, or salt (sodium) in your diet.  Drinking too much alcohol. Factors that are difficult or impossible to change  Having chronic kidney disease.  Having a family history of high blood pressure.  Age. Risk increases with age.  Race. You may be at higher risk if you are African-American.  Gender. Men are at higher risk than women before age 45. After age 65, women are at higher risk than men.  Having obstructive sleep apnea.  Stress. What are the signs or symptoms? Extremely high blood pressure (hypertensive crisis) may cause:  Headache.  Anxiety.  Shortness of breath.  Nosebleed.  Nausea and vomiting.  Severe chest pain.  Jerky movements you cannot control (seizures).  How is this  diagnosed? This condition is diagnosed by measuring your blood pressure while you are seated, with your arm resting on a surface. The cuff of the blood pressure monitor will be placed directly against the skin of your upper arm at the level of your heart. It should be measured at least twice using the same arm. Certain conditions can cause a difference in blood pressure between your right and left arms. Certain factors can cause blood pressure readings to be lower or higher than normal (elevated) for a short period of time:  When your blood pressure is higher when you are in a health care provider's office than when you are at home, this is called white coat hypertension. Most people with this condition do not need medicines.  When your blood pressure is higher at home than when you are in a health care provider's office, this is called masked hypertension. Most people with this condition may need medicines to control blood pressure.  If you have a high blood pressure reading during one visit or you have normal blood pressure with other risk factors:  You may be asked to return on a different day to have your blood pressure checked again.  You may be asked to monitor your blood pressure at home for 1 week or longer.  If you are diagnosed with hypertension, you may have other blood or imaging tests to help your health care provider understand your overall risk for other conditions. How is this treated? This condition is treated by making healthy lifestyle changes, such as eating healthy foods, exercising more, and reducing your alcohol intake. Your   health care provider may prescribe medicine if lifestyle changes are not enough to get your blood pressure under control, and if:  Your systolic blood pressure is above 130.  Your diastolic blood pressure is above 80.  Your personal target blood pressure may vary depending on your medical conditions, your age, and other factors. Follow these  instructions at home: Eating and drinking  Eat a diet that is high in fiber and potassium, and low in sodium, added sugar, and fat. An example eating plan is called the DASH (Dietary Approaches to Stop Hypertension) diet. To eat this way: ? Eat plenty of fresh fruits and vegetables. Try to fill half of your plate at each meal with fruits and vegetables. ? Eat whole grains, such as whole wheat pasta, brown rice, or whole grain bread. Fill about one quarter of your plate with whole grains. ? Eat or drink low-fat dairy products, such as skim milk or low-fat yogurt. ? Avoid fatty cuts of meat, processed or cured meats, and poultry with skin. Fill about one quarter of your plate with lean proteins, such as fish, chicken without skin, beans, eggs, and tofu. ? Avoid premade and processed foods. These tend to be higher in sodium, added sugar, and fat.  Reduce your daily sodium intake. Most people with hypertension should eat less than 1,500 mg of sodium a day.  Limit alcohol intake to no more than 1 drink a day for nonpregnant women and 2 drinks a day for men. One drink equals 12 oz of beer, 5 oz of wine, or 1 oz of hard liquor. Lifestyle  Work with your health care provider to maintain a healthy body weight or to lose weight. Ask what an ideal weight is for you.  Get at least 30 minutes of exercise that causes your heart to beat faster (aerobic exercise) most days of the week. Activities may include walking, swimming, or biking.  Include exercise to strengthen your muscles (resistance exercise), such as pilates or lifting weights, as part of your weekly exercise routine. Try to do these types of exercises for 30 minutes at least 3 days a week.  Do not use any products that contain nicotine or tobacco, such as cigarettes and e-cigarettes. If you need help quitting, ask your health care provider.  Monitor your blood pressure at home as told by your health care provider.  Keep all follow-up visits as  told by your health care provider. This is important. Medicines  Take over-the-counter and prescription medicines only as told by your health care provider. Follow directions carefully. Blood pressure medicines must be taken as prescribed.  Do not skip doses of blood pressure medicine. Doing this puts you at risk for problems and can make the medicine less effective.  Ask your health care provider about side effects or reactions to medicines that you should watch for. Contact a health care provider if:  You think you are having a reaction to a medicine you are taking.  You have headaches that keep coming back (recurring).  You feel dizzy.  You have swelling in your ankles.  You have trouble with your vision. Get help right away if:  You develop a severe headache or confusion.  You have unusual weakness or numbness.  You feel faint.  You have severe pain in your chest or abdomen.  You vomit repeatedly.  You have trouble breathing. Summary  Hypertension is when the force of blood pumping through your arteries is too strong. If this condition is not   controlled, it may put you at risk for serious complications.  Your personal target blood pressure may vary depending on your medical conditions, your age, and other factors. For most people, a normal blood pressure is less than 120/80.  Hypertension is treated with lifestyle changes, medicines, or a combination of both. Lifestyle changes include weight loss, eating a healthy, low-sodium diet, exercising more, and limiting alcohol. This information is not intended to replace advice given to you by your health care provider. Make sure you discuss any questions you have with your health care provider. Document Released: 03/01/2005 Document Revised: 01/28/2016 Document Reviewed: 01/28/2016 Elsevier Interactive Patient Education  2018 [employer].     Heartburn Heartburn is a type of pain or discomfort that can happen in the  throat or chest. It is often described as a burning pain. It may also cause a bad taste in the mouth. Heartburn may feel worse when you lie down or bend over. It may be caused by stomach contents that move back up (reflux) into the tube that connects the mouth with the stomach (esophagus). Follow these instructions at home: Take these actions to lessen your discomfort and to help avoid problems. Diet  Follow a diet as told by your doctor. You may need to avoid foods and drinks such as: ? Coffee and tea (with or without caffeine). ? Drinks that contain alcohol. ? Energy drinks and sports drinks. ? Carbonated drinks or sodas. ? Chocolate and cocoa. ? Peppermint and mint flavorings. ? Garlic and onions. ? Horseradish. ? Spicy and acidic foods, such as peppers, chili powder, curry powder, vinegar, hot sauces, and BBQ sauce. ? Citrus fruit juices and citrus fruits, such as oranges, lemons, and limes. ? Tomato-based foods, such as red sauce, chili, salsa, and pizza with red sauce. ? Fried and fatty foods, such as donuts, french fries, potato chips, and high-fat dressings. ? High-fat meats, such as hot dogs, rib eye steak, sausage, ham, and bacon. ? High-fat dairy items, such as whole milk, butter, and cream cheese.  Eat small meals often. Avoid eating large meals.  Avoid drinking large amounts of liquid with your meals.  Avoid eating meals during the 2-3 hours before bedtime.  Avoid lying down right after you eat.  Do not exercise right after you eat. General instructions  Pay attention to any changes in your symptoms.  Take over-the-counter and prescription medicines only as told by your doctor. Do not take aspirin, ibuprofen, or other NSAIDs unless your doctor says it is okay.  Do not use any tobacco products, including cigarettes, chewing tobacco, and e-cigarettes. If you need help quitting, ask your doctor.  Wear loose clothes. Do not wear anything tight around your  waist.  Raise (elevate) the head of your bed about 6 inches (15 cm).  Try to lower your stress. If you need help doing this, ask your doctor.  If you are overweight, lose an amount of weight that is healthy for you. Ask your doctor about a safe weight loss goal.  Keep all follow-up visits as told by your doctor. This is important. Contact a doctor if:  You have new symptoms.  You lose weight and you do not know why it is happening.  You have trouble swallowing, or it hurts to swallow.  You have wheezing or a cough that keeps happening.  Your symptoms do not get better with treatment.  You have heartburn often for more than two weeks. Get help right away if:  You have  pain in your arms, neck, jaw, teeth, or back.  You feel sweaty, dizzy, or light-headed.  You have chest pain or shortness of breath.  You throw up (vomit) and your throw up looks like blood or coffee grounds.  Your poop (stool) is bloody or black. This information is not intended to replace advice given to you by your health care provider. Make sure you discuss any questions you have with your health care provider. Document Released: 11/11/2010 Document Revised: 08/07/2015 Document Reviewed: 06/26/2014 Elsevier Interactive Patient Education  2018 [employer].     Sildenafil tablets (Revatio) What is this medicine? SILDENAFIL (sil DEN a fil) is used to treat pulmonary arterial hypertension. This is a serious heart and lung condition. This medicine helps to improve symptoms and quality of life. This medicine may be used for other purposes; ask your health care provider or pharmacist if you have questions. COMMON BRAND NAME(S): Revatio What should I tell my health care provider before I take this medicine? They need to know if you have any of these conditions: -anatomical deformation of the penis, Peyronie's disease, or history of priapism (painful and prolonged erection) -bleeding disorders -eye disease,  vision problems -heart disease -high or low blood pressure -history of blood diseases, like sickle cell anemia or leukemia -kidney disease -liver disease -pulmonary veno-occlusive disease (PVOD) -stomach ulcer -an unusual or allergic reaction to sildenafil, other medicines, foods, dyes, or preservatives -pregnant or trying to get pregnant -breast-feeding How should I use this medicine? Take this medicine by mouth with a glass of water. Follow the directions on the prescription label. You can take it with or without food. If it upsets your stomach, take it with food. Take your doses at regular intervals about 4 to 6 hours apart. Do not take it more often than directed. Do not stop taking except on your doctor's advice. Talk to your pediatrician regarding the use of this medicine in children. This medicine is not approved for use in children. Overdosage: If you think you have taken too much of this medicine contact a poison control center or emergency room at once. NOTE: This medicine is only for you. Do not share this medicine with others. What if I miss a dose? If you miss a dose, take it as soon as you can. If it is almost time for your next dose, take only that dose. Do not take double or extra doses. What may interact with this medicine? Do not take this medicine with any of the following medications: -cisapride -cobicistat -nitrates like amyl nitrite, isosorbide dinitrate, isosorbide mononitrate, nitroglycerin -riociguat -telaprevir This medicine may also interact with the following medications: -antiviral medicines for HIV or AIDS -bosentan -certain medicines for benign prostatic hyperplasia (BPH) -certain medicines for blood pressure -certain medicines for fungal infections like ketoconazole and itraconazole -cimetidine -erythromycin -rifampin This list may not describe all possible interactions. Give your health care provider a list of all the medicines, herbs,  non-prescription drugs, or dietary supplements you use. Also tell them if you smoke, drink alcohol, or use illegal drugs. Some items may interact with your medicine. What should I watch for while using this medicine? Tell your doctor or healthcare professional if your symptoms do not start to get better or if they get worse. Tell your doctor or health care professional right away if you have any change in your eyesight or hearing. You may get dizzy. Do not drive, use machinery, or do anything that needs mental alertness until you know how  this medicine affects you. Do not stand or sit up quickly, especially if you are an older patient. This reduces the risk of dizzy or fainting spells. Avoid alcoholic drinks; they can make you more dizzy. What side effects may I notice from receiving this medicine? Side effects that you should report to your doctor or health care professional as soon as possible: -allergic reactions like skin rash, itching or hives, swelling of the face, lips, or tongue -breathing problems -changes in vision -chest pain -decreased hearing -fast, irregular heartbeat -men: prolonged or painful erection (lasting more than 4 hours) Side effects that usually do not require medical attention (report to your doctor or health care professional if they continue or are bothersome): -facial flushing -headache -nosebleed -trouble sleeping -upset stomach This list may not describe all possible side effects. Call your doctor for medical advice about side effects. You may report side effects to FDA at 1-800-FDA-1088. Where should I keep my medicine? Keep out of reach of children. Store at room temperature between 15 and 30 degrees C (59 and 86 degrees F). Throw away any unused medicine after the expiration date. NOTE: This sheet is a summary. It may not cover all possible information. If you have questions about this medicine, talk to your doctor, pharmacist, or health care provider.   2018 Elsevier/Gold Standard (2015-02-12 17:18:06)     IF you received an x-ray today, you will receive an invoice from [facility]. Please contact [facility] at [phone] with questions or concerns regarding your invoice.   IF you received labwork today, you will receive an invoice from [facility]. Please contact [facility] at [phone] with questions or concerns regarding your invoice.   Our billing staff will not be able to assist you with questions regarding bills from these companies.  You will be contacted with the lab results as soon as they are available. The fastest way to get your results is to activate your My Chart account. Instructions are located on the last page of this paperwork. If you have not heard from [place] regarding the results in 2 weeks, please contact this office.

## 2017-01-20 NOTE — Progress Notes (Signed)
MRN: [id number] DOB: [birth year]  Subjective:   Jerry Freeman is a 61 y.o. male presenting for chief complaint of Medication Refill (all)  HTN - Managed with amlodipine. Does not check blood pressure at home. Has not worked out in the past 2 months. Denies dizziness, chronic headache, blurred vision, chest pain, shortness of breath, heart racing, palpitations, nausea, vomiting, abdominal pain, hematuria, lower leg swelling. Denies smoking cigarettes or drinking alcohol.   Allergies - Managed with Zyrtec. Does very well with managing allergies with this alone.   Hypothyroidism - Managed with levothyroxine. Has a history of thyroidectomy s/p thyroid cancer. Denies depression, constipation, cold intolerance, fatigue.   Arthritis - Managed with meloxicam. Does not use this every day. Has arthritis in his right knee, left toes, neck.  GERD - Managed well with omeprazole. His symptoms manifested as a cough. But has had heartburn in the past when he does not take it.   ED - Managed well with sildenafil. Denies history of heart disease, does not use it daily, is out of this medication.   Jerry Freeman has a current medication list which includes the following prescription(s): amlodipine, calcium carbonate, cetirizine, krill oil, levothyroxine, meloxicam, multiple vitamins-minerals, omeprazole, saw palmetto, sildenafil, and vitamin c. Also has No Known Allergies.  Jerry Freeman  has a past medical history of Arthritis, Cancer ([place]), Hypothyroidism, and Seasonal allergies. Also  has a past surgical history that includes Hernia repair; Thyroidectomy; and rupture quad tenden.  Objective:   Vitals: BP 138/80   Pulse 62   Temp 98.3 F (36.8 C) (Oral)   Resp 16   Ht 6' (1.829 m)   Wt 211 lb (95.7 kg)   SpO2 99%   BMI 28.62 kg/m   BP Readings from Last 3 Encounters:  01/20/17 138/80  07/02/16 132/84  12/23/15 128/80   Physical Exam  Constitutional: He is oriented to person, place, and time. He appears  well-developed and well-nourished.  HENT:  Mouth/Throat: Oropharynx is clear and moist.  Eyes: Pupils are equal, round, and reactive to light. No scleral icterus.  Neck: Normal range of motion. Neck supple.  Anterior, horizontal surgical scar noted.  Cardiovascular: Normal rate, regular rhythm and intact distal pulses. Exam reveals no gallop and no friction rub.  No murmur heard. Pulmonary/Chest: No respiratory distress. He has no wheezes. He has no rales.  Abdominal: Soft. Bowel sounds are normal. He exhibits no distension and no mass. There is no tenderness. There is no guarding.  Musculoskeletal: He exhibits no edema.  Neurological: He is alert and oriented to person, place, and time.  Skin: Skin is warm and dry.  Psychiatric: He has a normal mood and affect.   Assessment and Plan :   1. Essential hypertension - Refilled amlodipine. Encouraged patient to start exercising again. Labs pending. Follow up in 6 months. - Comprehensive metabolic panel - amLODipine (NORVASC) 5 MG tablet; TAKE 1 TABLET DAILY  Dispense: 90 tablet; Refill: 3  2. Environmental allergies - Refilled Zyrtec  3. History of thyroid cancer 4. H/O total thyroidectomy - Thyroid Profile pending. Will refill as appropriate.  5. Osteoarthritis of right knee, unspecified osteoarthritis type - Refilled 7.5mg  of meloxicam. Counseled that patient should not be using this daily.   6. Heartburn - Stable, refilled omeprazole.  7. Erectile dysfunction, unspecified erectile dysfunction type - Stable, refills provided. Counseled patient on potential for adverse effects with medications prescribed today, patient verbalized understanding.   8. Need for prophylactic vaccination and inoculation against influenza - Flu Vaccine QUAD  36+ mos IM   Jaynee Eagles, [facility] [place] [phone] 01/20/2017  11:02 AM

## 2017-01-21 ENCOUNTER — Other Ambulatory Visit: Payer: Self-pay | Admitting: Urgent Care

## 2017-01-21 DIAGNOSIS — E890 Postprocedural hypothyroidism: Secondary | ICD-10-CM

## 2017-01-21 LAB — COMPREHENSIVE METABOLIC PANEL
A/G RATIO: 1.6 (ref 1.2–2.2)
ALT: 25 IU/L (ref 0–44)
AST: 24 IU/L (ref 0–40)
Albumin: 4.3 g/dL (ref 3.6–4.8)
Alkaline Phosphatase: 73 IU/L (ref 39–117)
BUN/Creatinine Ratio: 14 (ref 10–24)
BUN: 17 mg/dL (ref 8–27)
Bilirubin Total: 0.6 mg/dL (ref 0.0–1.2)
CALCIUM: 9.2 mg/dL (ref 8.6–10.2)
CO2: 26 mmol/L (ref 20–29)
Chloride: 101 mmol/L (ref 96–106)
Creatinine, Ser: 1.23 mg/dL (ref 0.76–1.27)
GFR, EST AFRICAN AMERICAN: 72 mL/min/{1.73_m2} (ref >59)
GFR, EST NON AFRICAN AMERICAN: 62 mL/min/{1.73_m2} (ref >59)
GLOBULIN, TOTAL: 2.7 g/dL (ref 1.5–4.5)
Glucose: 86 mg/dL (ref 65–99)
POTASSIUM: 4.7 mmol/L (ref 3.5–5.2)
SODIUM: 139 mmol/L (ref 134–144)
TOTAL PROTEIN: 7 g/dL (ref 6.0–8.5)

## 2017-01-21 LAB — THYROID PANEL
FREE THYROXINE INDEX: 2.4 (ref 1.2–4.9)
T3 Uptake Ratio: 31 % (ref 24–39)
T4, Total: 7.7 ug/dL (ref 4.5–12.0)

## 2017-01-21 MED ORDER — LEVOTHYROXINE SODIUM 150 MCG PO TABS: 150.0000 ug | ORAL_TABLET | Freq: Every day | ORAL | 1 refills | Status: DC

## 2017-06-22 ENCOUNTER — Encounter: Payer: Self-pay | Admitting: Physician Assistant

## 2017-07-22 ENCOUNTER — Other Ambulatory Visit: Payer: Self-pay

## 2017-07-22 ENCOUNTER — Ambulatory Visit (INDEPENDENT_AMBULATORY_CARE_PROVIDER_SITE_OTHER): Payer: BLUE CROSS/BLUE SHIELD | Admitting: Physician Assistant

## 2017-07-22 ENCOUNTER — Encounter: Payer: Self-pay | Admitting: Physician Assistant

## 2017-07-22 VITALS — BP 122/70 | HR 55 | Temp 97.6°F | Resp 16 | Ht 71.0 in | Wt 215.8 lb

## 2017-07-22 DIAGNOSIS — N529 Male erectile dysfunction, unspecified: Secondary | ICD-10-CM

## 2017-07-22 DIAGNOSIS — Z0000 Encounter for general adult medical examination without abnormal findings: Secondary | ICD-10-CM | POA: Diagnosis not present

## 2017-07-22 DIAGNOSIS — Z1211 Encounter for screening for malignant neoplasm of colon: Secondary | ICD-10-CM | POA: Diagnosis not present

## 2017-07-22 DIAGNOSIS — E039 Hypothyroidism, unspecified: Secondary | ICD-10-CM

## 2017-07-22 DIAGNOSIS — Z1321 Encounter for screening for nutritional disorder: Secondary | ICD-10-CM

## 2017-07-22 DIAGNOSIS — E890 Postprocedural hypothyroidism: Secondary | ICD-10-CM

## 2017-07-22 DIAGNOSIS — Z1329 Encounter for screening for other suspected endocrine disorder: Secondary | ICD-10-CM | POA: Diagnosis not present

## 2017-07-22 DIAGNOSIS — M5136 Other intervertebral disc degeneration, lumbar region: Secondary | ICD-10-CM

## 2017-07-22 DIAGNOSIS — Z130 Encounter for screening for diseases of the blood and blood-forming organs and certain disorders involving the immune mechanism: Secondary | ICD-10-CM | POA: Diagnosis not present

## 2017-07-22 DIAGNOSIS — Z13228 Encounter for screening for other metabolic disorders: Secondary | ICD-10-CM

## 2017-07-22 MED ORDER — MELOXICAM 7.5 MG PO TABS: 7.5000 mg | ORAL_TABLET | Freq: Every day | ORAL | 5 refills | Status: DC

## 2017-07-22 MED ORDER — LEVOTHYROXINE SODIUM 150 MCG PO TABS: 150.0000 ug | ORAL_TABLET | Freq: Every day | ORAL | 1 refills | Status: DC

## 2017-07-22 MED ORDER — SILDENAFIL CITRATE 100 MG PO TABS: ORAL_TABLET | ORAL | 5 refills | Status: DC

## 2017-07-22 NOTE — Patient Instructions (Signed)
     IF you received an x-ray today, you will receive an invoice from [facility]. Please contact [facility] at [phone] with questions or concerns regarding your invoice.   IF you received labwork today, you will receive an invoice from [facility]. Please contact [facility] at [phone] with questions or concerns regarding your invoice.   Our billing staff will not be able to assist you with questions regarding bills from these companies.  You will be contacted with the lab results as soon as they are available. The fastest way to get your results is to activate your My Chart account. Instructions are located on the last page of this paperwork. If you have not heard from us regarding the results in 2 weeks, please contact this office.     

## 2017-07-22 NOTE — Progress Notes (Signed)
07/22/2017 11:28 AM   DOB: 01-10-1956 / MRN: [id number]  SUBJECTIVE:  Jerry Freeman is a 62 y.o. male presenting for annual exam. He is new to me.  He would like his meds refilled as well.  Several were filled with a year back in late 2019.  Chart shows he should be out of Synthroid and Meloxicam.  He is aware that he is due back for his colonoscopy.  He went last year and saw Dr. Almyra Freeman and felt he should have his colonoscopy that day however this was for an office visit.  He was upset about this.  He has not been back for his colonoscopy.  Complains of some right-sided wrist pain, described as an ache, and this waxes and wanes.  Meloxicam and ice help the problem.  He thinks he has arthritis in the joint.  Understands the risk of PSA screening and would like to proceed given normal levels in the past.  Immunizations show he is up-to-date.  Immunization History  Administered Date(s) Administered  . Influenza Split 12/08/2011  . Influenza,inj,Quad PF,6+ Mos 03/13/2013, 03/13/2014, 11/19/2014, 12/23/2015, 01/20/2017  . Pneumococcal Conjugate-13 03/13/2014  . Tdap 11/03/2009     He has No Known Allergies.   He  has a past medical history of Arthritis, Cancer ([place]), Hypothyroidism, and Seasonal allergies.    He  reports that he quit smoking about 25 years ago. His smoking use included cigarettes. He quit after 18.00 years of use. He has never used smokeless tobacco. He reports that he does not drink alcohol or use drugs. He  has no sexual activity history on file. The patient  has a past surgical history that includes Hernia repair; Thyroidectomy; and rupture quad tenden.  His family history includes Arthritis in his maternal grandfather; Cancer in his father and mother; Hyperlipidemia in his maternal grandmother; Hypertension in his maternal grandmother; Kidney disease in his maternal grandmother.  Review of Systems  Constitutional: Negative for fever.  Respiratory: Negative for shortness of  breath.   Cardiovascular: Negative for chest pain and leg swelling.  Gastrointestinal: Negative for abdominal pain, blood in stool, constipation, diarrhea, heartburn, melena, nausea and vomiting.  Musculoskeletal: Positive for joint pain. Negative for back pain, falls, myalgias and neck pain.  Skin: Negative for rash.  Neurological: Negative.  Negative for dizziness.  Psychiatric/Behavioral: Negative.     The problem list and medications were reviewed and updated by myself where necessary and exist elsewhere in the encounter.   OBJECTIVE:  BP 122/70 (BP Location: Left Arm, Patient Position: Sitting, Cuff Size: Large)   Pulse (!) 55   Temp 97.6 F (36.4 C) (Oral)   Resp 16   Ht 5\' 11"  (1.803 m)   Wt 215 lb 12.8 oz (97.9 kg)   SpO2 98%   BMI 30.10 kg/m   Physical Exam  Constitutional: He is oriented to person, place, and time. He appears well-developed. He does not appear ill.  Eyes: Pupils are equal, round, and reactive to light. Conjunctivae and EOM are normal.  Cardiovascular: Normal rate, regular rhythm, S1 normal, S2 normal, normal heart sounds, intact distal pulses and normal pulses. Exam reveals no gallop and no friction rub.  No murmur heard. Pulmonary/Chest: Effort normal. No stridor. No respiratory distress. He has no wheezes. He has no rales.  Abdominal: He exhibits no distension.  Musculoskeletal: Normal range of motion. He exhibits no edema.  Neurological: He is alert and oriented to person, place, and time. No cranial nerve deficit. Coordination normal.  Skin: Skin is warm and dry. He is not diaphoretic.  Psychiatric: He has a normal mood and affect.  Nursing note and vitals reviewed.   Lab Results  Component Value Date   PSA 0.66 04/03/2015   PSA 0.56 03/13/2013   PSA 0.51 12/08/2011   Lab Results  Component Value Date   HGBA1C 5.3 03/13/2013   Lab Results  Component Value Date   WBC 4.1 07/02/2016   HGB 13.4 07/02/2016   HCT 41.0 07/02/2016   MCV 81  07/02/2016   PLT 170 07/02/2016   Lab Results  Component Value Date   CREATININE 1.23 01/20/2017   BUN 17 01/20/2017   NA 139 01/20/2017   K 4.7 01/20/2017   CL 101 01/20/2017   CO2 26 01/20/2017   Lab Results  Component Value Date   ALT 25 01/20/2017   AST 24 01/20/2017   ALKPHOS 73 01/20/2017   BILITOT 0.6 01/20/2017   No results found for this or any previous visit (from the past 72 hour(s)).  No results found.  ASSESSMENT AND PLAN:  Jerry Freeman was seen today for annual exam and medication refill.  Diagnoses and all orders for this visit:  DDD (degenerative disc disease), lumbar -     meloxicam (MOBIC) 7.5 MG tablet; Take 1 tablet (7.5 mg total) by mouth daily.  Postoperative hypothyroidism -     levothyroxine (SYNTHROID, LEVOTHROID) 150 MCG tablet; Take 1 tablet (150 mcg total) by mouth daily.  Annual physical exam  Screening for endocrine, nutritional, metabolic and immunity disorder -     PSA -     Hemoglobin A1c -     CBC -     Renal Function Panel -     Hepatic Function Panel  Special screening for malignant neoplasms, colon -     Ambulatory referral to Gastroenterology  Erectile dysfunction, unspecified erectile dysfunction type -     sildenafil (VIAGRA) 100 MG tablet; TAKE 1 TABLET DAILY AS NEEDED FOR ERECTILE DYSFUNCTION AT LEAST 1 HOUR PRIOR TO SEX.    The patient is advised to call or return to clinic if he does not see an improvement in symptoms, or to seek the care of the closest emergency department if he worsens with the above plan.   Jerry Freeman, MHS, PA-C [facility] 07/22/2017 11:27 AM

## 2017-07-23 LAB — HEPATIC FUNCTION PANEL
ALK PHOS: 66 IU/L (ref 39–117)
ALT: 23 IU/L (ref 0–44)
AST: 27 IU/L (ref 0–40)
BILIRUBIN, DIRECT: 0.21 mg/dL (ref 0.00–0.40)
Bilirubin Total: 1 mg/dL (ref 0.0–1.2)
Total Protein: 7 g/dL (ref 6.0–8.5)

## 2017-07-23 LAB — CBC
HEMATOCRIT: 40.8 % (ref 37.5–51.0)
HEMOGLOBIN: 13.6 g/dL (ref 13.0–17.7)
MCH: 26.8 pg (ref 26.6–33.0)
MCHC: 33.3 g/dL (ref 31.5–35.7)
MCV: 81 fL (ref 79–97)
Platelets: 184 10*3/uL (ref 150–379)
RBC: 5.07 x10E6/uL (ref 4.14–5.80)
RDW: 17.2 % — ABNORMAL HIGH (ref 12.3–15.4)
WBC: 4.1 10*3/uL (ref 3.4–10.8)

## 2017-07-23 LAB — HEMOGLOBIN A1C
Est. average glucose Bld gHb Est-mCnc: 108 mg/dL
HEMOGLOBIN A1C: 5.4 % (ref 4.8–5.6)

## 2017-07-23 LAB — RENAL FUNCTION PANEL
ALBUMIN: 4.3 g/dL (ref 3.6–4.8)
BUN/Creatinine Ratio: 14 (ref 10–24)
BUN: 17 mg/dL (ref 8–27)
CO2: 26 mmol/L (ref 20–29)
Calcium: 8.9 mg/dL (ref 8.6–10.2)
Chloride: 104 mmol/L (ref 96–106)
Creatinine, Ser: 1.25 mg/dL (ref 0.76–1.27)
GFR, EST AFRICAN AMERICAN: 70 mL/min/{1.73_m2} (ref >59)
GFR, EST NON AFRICAN AMERICAN: 60 mL/min/{1.73_m2} (ref >59)
Glucose: 78 mg/dL (ref 65–99)
Phosphorus: 3.5 mg/dL (ref 2.5–4.5)
Potassium: 4.3 mmol/L (ref 3.5–5.2)
Sodium: 142 mmol/L (ref 134–144)

## 2017-07-23 LAB — PSA: Prostate Specific Ag, Serum: 0.8 ng/mL (ref 0.0–4.0)

## 2017-07-25 DIAGNOSIS — E039 Hypothyroidism, unspecified: Secondary | ICD-10-CM | POA: Diagnosis not present

## 2017-07-25 NOTE — Addendum Note (Signed)
Addended by: Ileana Roup on: 07/25/2017 08:17 AM   Modules accepted: Orders

## 2017-07-26 LAB — TSH: TSH: 1.33 u[IU]/mL (ref 0.450–4.500)

## 2017-09-01 ENCOUNTER — Telehealth: Payer: Self-pay | Admitting: Physician Assistant

## 2017-09-01 NOTE — Telephone Encounter (Signed)
Pt has been scheduled to see Dr. Benson Norway on 08/22/17 at 2:45pm

## 2017-11-18 DIAGNOSIS — M19072 Primary osteoarthritis, left ankle and foot: Secondary | ICD-10-CM | POA: Diagnosis not present

## 2017-11-18 DIAGNOSIS — M659 Synovitis and tenosynovitis, unspecified: Secondary | ICD-10-CM | POA: Diagnosis not present

## 2017-11-18 DIAGNOSIS — M25872 Other specified joint disorders, left ankle and foot: Secondary | ICD-10-CM | POA: Diagnosis not present

## 2018-01-26 ENCOUNTER — Encounter: Payer: Self-pay | Admitting: Emergency Medicine

## 2018-01-26 ENCOUNTER — Other Ambulatory Visit: Payer: Self-pay

## 2018-01-26 ENCOUNTER — Ambulatory Visit: Payer: BLUE CROSS/BLUE SHIELD | Admitting: Emergency Medicine

## 2018-01-26 VITALS — BP 132/81 | HR 61 | Temp 98.0°F | Resp 18 | Ht 71.0 in | Wt 220.2 lb

## 2018-01-26 DIAGNOSIS — M5136 Other intervertebral disc degeneration, lumbar region: Secondary | ICD-10-CM

## 2018-01-26 DIAGNOSIS — E890 Postprocedural hypothyroidism: Secondary | ICD-10-CM | POA: Diagnosis not present

## 2018-01-26 DIAGNOSIS — Z23 Encounter for immunization: Secondary | ICD-10-CM

## 2018-01-26 DIAGNOSIS — N529 Male erectile dysfunction, unspecified: Secondary | ICD-10-CM

## 2018-01-26 DIAGNOSIS — R12 Heartburn: Secondary | ICD-10-CM

## 2018-01-26 DIAGNOSIS — I10 Essential (primary) hypertension: Secondary | ICD-10-CM | POA: Diagnosis not present

## 2018-01-26 MED ORDER — LEVOTHYROXINE SODIUM 150 MCG PO TABS: 150.0000 ug | ORAL_TABLET | Freq: Every day | ORAL | 3 refills | Status: DC

## 2018-01-26 MED ORDER — SILDENAFIL CITRATE 100 MG PO TABS: ORAL_TABLET | ORAL | 5 refills | Status: DC

## 2018-01-26 MED ORDER — MELOXICAM 7.5 MG PO TABS: 7.5000 mg | ORAL_TABLET | Freq: Every day | ORAL | 5 refills | Status: DC

## 2018-01-26 MED ORDER — OMEPRAZOLE 20 MG PO CPDR: 20.0000 mg | DELAYED_RELEASE_CAPSULE | Freq: Every day | ORAL | 3 refills | Status: DC

## 2018-01-26 MED ORDER — CETIRIZINE HCL 10 MG PO TABS: 10.0000 mg | ORAL_TABLET | Freq: Every day | ORAL | 3 refills | Status: DC

## 2018-01-26 MED ORDER — AMLODIPINE BESYLATE 5 MG PO TABS: ORAL_TABLET | ORAL | 3 refills | Status: DC

## 2018-01-26 NOTE — Patient Instructions (Addendum)

## 2018-01-26 NOTE — Progress Notes (Signed)
Jerry Freeman 62 y.o.   Chief Complaint  Patient presents with  . Medication Refill    all meds     HISTORY OF PRESENT ILLNESS: This is a 62 y.o. male with a history of hypertension, hypothyroidism, arthritis, GERD, ED here for medication refill.  First visit with me.  Has no complaints or medical concerns today.  HPI   Prior to Admission medications   Medication Sig Start Date End Date Taking? Authorizing Provider  amLODipine (NORVASC) 5 MG tablet TAKE 1 TABLET DAILY 01/20/17  Yes Jaynee Eagles, PA-C  calcium carbonate 1250 MG capsule Take 1,250 mg by mouth 2 (two) times daily with a meal. Per pt it is citracal plus D   Yes [provider]  cetirizine (ZYRTEC) 10 MG tablet Take 1 tablet (10 mg total) daily by mouth. 01/20/17  Yes Jaynee Eagles, PA-C  Krill Oil (MAXIMUM RED KRILL PO) Take by mouth.   Yes [provider]  levothyroxine (SYNTHROID, LEVOTHROID) 150 MCG tablet Take 1 tablet (150 mcg total) by mouth daily. 07/22/17  Yes Tereasa Coop, PA-C  meloxicam (MOBIC) 7.5 MG tablet Take 1 tablet (7.5 mg total) by mouth daily. 07/22/17  Yes Tereasa Coop, PA-C  Multiple Vitamins-Minerals (MEGA MULTI MEN PO) Take by mouth daily. [place] 50 PLUS   Yes [provider]  saw palmetto 160 MG capsule Take 160 mg by mouth daily.   Yes [provider]  sildenafil (VIAGRA) 100 MG tablet TAKE 1 TABLET DAILY AS NEEDED FOR ERECTILE DYSFUNCTION AT LEAST 1 HOUR PRIOR TO SEX. 07/22/17  Yes Tereasa Coop, PA-C  vitamin C (ASCORBIC ACID) 500 MG tablet Take 500 mg by mouth daily.    Yes [provider]  omeprazole (PRILOSEC) 20 MG capsule Take 1 capsule (20 mg total) daily by mouth. 01/20/17 01/20/18  Jaynee Eagles, PA-C    No Known Allergies  Patient Active Problem List   Diagnosis Date Noted  . DDD (degenerative disc disease), lumbar 11/20/2013  . Allergic rhinitis 12/11/2011  . Family history of colon cancer 12/11/2011  . Cancer of thyroid ([place]) 12/11/2011    . Hypothyroidism 08/16/2011  . ED (erectile dysfunction) 08/16/2011    Past Medical History:  Diagnosis Date  . Arthritis   . Cancer ([place])   . Hypothyroidism   . Seasonal allergies     Past Surgical History:  Procedure Laterality Date  . HERNIA REPAIR     13 yrs ago  . rupture quad tenden    . THYROIDECTOMY     4 or 5 yrs ago    Social History   Socioeconomic History  . Marital status: Married    Spouse name: Not on file  . Number of children: Not on file  . Years of education: Not on file  . Highest education level: Not on file  Occupational History  . Occupation: Market researcher  . Financial resource strain: Not on file  . Food insecurity:    Worry: Not on file    Inability: Not on file  . Transportation needs:    Medical: Not on file    Non-medical: Not on file  Tobacco Use  . Smoking status: Former Smoker    Years: 18.00    Types: Cigarettes    Last attempt to quit: 12/15/1991    Years since quitting: 26.1  . Smokeless tobacco: Never Used  . Tobacco comment: also smoked marijuana,and cocaine (free-base)   Substance and Sexual Activity  . Alcohol  use: No    Alcohol/week: 0.0 standard drinks  . Drug use: No  . Sexual activity: Not on file  Lifestyle  . Physical activity:    Days per week: Not on file    Minutes per session: Not on file  . Stress: Not on file  Relationships  . Social connections:    Talks on phone: Not on file    Gets together: Not on file    Attends religious service: Not on file    Active member of club or organization: Not on file    Attends meetings of clubs or organizations: Not on file    Relationship status: Not on file  . Intimate partner violence:    Fear of current or ex partner: Not on file    Emotionally abused: Not on file    Physically abused: Not on file    Forced sexual activity: Not on file  Other Topics Concern  . Not on file  Social History Narrative   Exercise cardio and weights 4 days/wk for 1  hour    Family History  Problem Relation Age of Onset  . Cancer Mother        lung  . Cancer Father        rectal  . Kidney disease Maternal Grandmother        dialysis  . Hypertension Maternal Grandmother   . Hyperlipidemia Maternal Grandmother   . Arthritis Maternal Grandfather      Review of Systems  Constitutional: Negative.  Negative for chills and fever.  HENT: Negative.   Eyes: Negative.  Negative for blurred vision and double vision.  Respiratory: Negative.  Negative for cough and shortness of breath.   Cardiovascular: Negative.  Negative for chest pain and palpitations.  Gastrointestinal: Negative for abdominal pain, diarrhea, nausea and vomiting.  Genitourinary: Negative.  Negative for dysuria.  Skin: Negative.  Negative for rash.  Neurological: Negative.   Endo/Heme/Allergies: Negative.   All other systems reviewed and are negative.     Vitals:   01/26/18 1618  BP: 132/81  Pulse: 61  Resp: 18  Temp: 98 F (36.7 C)  SpO2: 99%    Physical Exam  Constitutional: He is oriented to person, place, and time. He appears well-developed and well-nourished.  HENT:  Head: Normocephalic and atraumatic.  Mouth/Throat: Oropharynx is clear and moist.  Eyes: Pupils are equal, round, and reactive to light. Conjunctivae and EOM are normal.  Neck: Normal range of motion. Neck supple.  Cardiovascular: Normal rate, regular rhythm and normal heart sounds.  Pulmonary/Chest: Effort normal and breath sounds normal.  Musculoskeletal: Normal range of motion.  Neurological: He is alert and oriented to person, place, and time. No sensory deficit. He exhibits normal muscle tone.  Skin: Skin is warm and dry. Capillary refill takes less than 2 seconds.  Psychiatric: He has a normal mood and affect. His behavior is normal.  Vitals reviewed.  A total of 25 minutes was spent in the room with the patient, greater than 50% of which was in counseling/coordination of care regarding chronic  medical conditions, medications, need for follow-up.   ASSESSMENT & PLAN: Jerry Freeman was seen today for medication refill.  Diagnoses and all orders for this visit:  Essential hypertension -     amLODipine (NORVASC) 5 MG tablet; TAKE 1 TABLET DAILY  Flu vaccine need -     Flu Vaccine QUAD 36+ mos IM  Postoperative hypothyroidism -     TSH -     levothyroxine (  SYNTHROID, LEVOTHROID) 150 MCG tablet; Take 1 tablet (150 mcg total) by mouth daily.  DDD (degenerative disc disease), lumbar -     meloxicam (MOBIC) 7.5 MG tablet; Take 1 tablet (7.5 mg total) by mouth daily.  Heartburn -     omeprazole (PRILOSEC) 20 MG capsule; Take 1 capsule (20 mg total) by mouth daily.  Erectile dysfunction, unspecified erectile dysfunction type -     sildenafil (VIAGRA) 100 MG tablet; TAKE 1 TABLET DAILY AS NEEDED FOR ERECTILE DYSFUNCTION AT LEAST 1 HOUR PRIOR TO SEX.  Other orders -     cetirizine (ZYRTEC) 10 MG tablet; Take 1 tablet (10 mg total) by mouth daily.    Patient Instructions       If you have lab work done today you will be contacted with your lab results within the next 2 weeks.  If you have not heard from [place] then please contact us. The fastest way to get your results is to register for My Chart.   IF you received an x-ray today, you will receive an invoice from [facility]. Please contact [facility] at [phone] with questions or concerns regarding your invoice.   IF you received labwork today, you will receive an invoice from [facility]. Please contact [facility] at [phone] with questions or concerns regarding your invoice.   Our billing staff will not be able to assist you with questions regarding bills from these companies.  You will be contacted with the lab results as soon as they are available. The fastest way to get your results is to activate your My Chart account. Instructions are located on the last page of this paperwork. If you have not heard  from [place] regarding the results in 2 weeks, please contact this office.     Health Maintenance, Male A healthy lifestyle and preventive care is important for your health and wellness. Ask your health care provider about what schedule of regular examinations is right for you. What should I know about weight and diet? Eat a Healthy Diet  Eat plenty of vegetables, fruits, whole grains, low-fat dairy products, and lean protein.  Do not eat a lot of foods high in solid fats, added sugars, or salt.  Maintain a Healthy Weight Regular exercise can help you achieve or maintain a healthy weight. You should:  Do at least 150 minutes of exercise each week. The exercise should increase your heart rate and make you sweat (moderate-intensity exercise).  Do strength-training exercises at least twice a week.  Watch Your Levels of Cholesterol and Blood Lipids  Have your blood tested for lipids and cholesterol every 5 years starting at 64 years of age. If you are at high risk for heart disease, you should start having your blood tested when you are 64 years old. You may need to have your cholesterol levels checked more often if: ? Your lipid or cholesterol levels are high. ? You are older than 64 years of age. ? You are at high risk for heart disease.  What should I know about cancer screening? Many types of cancers can be detected early and may often be prevented. Lung Cancer  You should be screened every year for lung cancer if: ? You are a current smoker who has smoked for at least 30 years. ? You are a former smoker who has quit within the past 15 years.  Talk to your health care provider about your screening options, when you should start screening, and how often you should be  screened.  Colorectal Cancer  Routine colorectal cancer screening usually begins at 64 years of age and should be repeated every 5-10 years until you are 64 years old. You may need to be screened more often if early  forms of precancerous polyps or small growths are found. Your health care provider may recommend screening at an earlier age if you have risk factors for colon cancer.  Your health care provider may recommend using home test kits to check for hidden blood in the stool.  A small camera at the end of a tube can be used to examine your colon (sigmoidoscopy or colonoscopy). This checks for the earliest forms of colorectal cancer.  Prostate and Testicular Cancer  Depending on your age and overall health, your health care provider may do certain tests to screen for prostate and testicular cancer.  Talk to your health care provider about any symptoms or concerns you have about testicular or prostate cancer.  Skin Cancer  Check your skin from head to toe regularly.  Tell your health care provider about any new moles or changes in moles, especially if: ? There is a change in a mole's size, shape, or color. ? You have a mole that is larger than a pencil eraser.  Always use sunscreen. Apply sunscreen liberally and repeat throughout the day.  Protect yourself by wearing long sleeves, pants, a wide-brimmed hat, and sunglasses when outside.  What should I know about heart disease, diabetes, and high blood pressure?  If you are 19-16 years of age, have your blood pressure checked every 3-5 years. If you are 35 years of age or older, have your blood pressure checked every year. You should have your blood pressure measured twice-once when you are at a hospital or clinic, and once when you are not at a hospital or clinic. Record the average of the two measurements. To check your blood pressure when you are not at a hospital or clinic, you can use: ? An automated blood pressure machine at a pharmacy. ? A home blood pressure monitor.  Talk to your health care provider about your target blood pressure.  If you are between 8-26 years old, ask your health care provider if you should take aspirin to prevent  heart disease.  Have regular diabetes screenings by checking your fasting blood sugar level. ? If you are at a normal weight and have a low risk for diabetes, have this test once every three years after the age of 57. ? If you are overweight and have a high risk for diabetes, consider being tested at a younger age or more often.  A one-time screening for abdominal aortic aneurysm (AAA) by ultrasound is recommended for men aged 65-75 years who are current or former smokers. What should I know about preventing infection? Hepatitis B If you have a higher risk for hepatitis B, you should be screened for this virus. Talk with your health care provider to find out if you are at risk for hepatitis B infection. Hepatitis C Blood testing is recommended for:  Everyone born from [birth year] through 1967.  Anyone with known risk factors for hepatitis C.  Sexually Transmitted Diseases (STDs)  You should be screened each year for STDs including gonorrhea and chlamydia if: ? You are sexually active and are younger than 64 years of age. ? You are older than 64 years of age and your health care provider tells you that you are at risk for this type of infection. ? Your sexual  activity has changed since you were last screened and you are at an increased risk for chlamydia or gonorrhea. Ask your health care provider if you are at risk.  Talk with your health care provider about whether you are at high risk of being infected with HIV. Your health care provider may recommend a prescription medicine to help prevent HIV infection.  What else can I do?  Schedule regular health, dental, and eye exams.  Stay current with your vaccines (immunizations).  Do not use any tobacco products, such as cigarettes, chewing tobacco, and e-cigarettes. If you need help quitting, ask your health care provider.  Limit alcohol intake to no more than 2 drinks per day. One drink equals 12 ounces of beer, 5 ounces of wine, or 1 ounces  of hard liquor.  Do not use street drugs.  Do not share needles.  Ask your health care provider for help if you need support or information about quitting drugs.  Tell your health care provider if you often feel depressed.  Tell your health care provider if you have ever been abused or do not feel safe at home. This information is not intended to replace advice given to you by your health care provider. Make sure you discuss any questions you have with your health care provider. Document Released: 08/28/2007 Document Revised: 10/29/2015 Document Reviewed: 12/03/2014 Elsevier Interactive Patient Education  2018 [employer]vier Inc.      Agustina Caroli, MD [facility]

## 2018-01-27 LAB — TSH: TSH: 1.83 u[IU]/mL (ref 0.450–4.500)

## 2018-01-30 ENCOUNTER — Encounter: Payer: Self-pay | Admitting: *Deleted

## 2018-09-05 DIAGNOSIS — L309 Dermatitis, unspecified: Secondary | ICD-10-CM | POA: Diagnosis not present

## 2018-12-18 ENCOUNTER — Encounter: Payer: Self-pay | Admitting: Emergency Medicine

## 2018-12-18 ENCOUNTER — Other Ambulatory Visit: Payer: Self-pay

## 2018-12-18 ENCOUNTER — Ambulatory Visit (INDEPENDENT_AMBULATORY_CARE_PROVIDER_SITE_OTHER): Payer: BC Managed Care – PPO | Admitting: Emergency Medicine

## 2018-12-18 VITALS — BP 129/81 | HR 68 | Temp 98.0°F | Resp 16 | Ht 73.0 in | Wt 216.8 lb

## 2018-12-18 DIAGNOSIS — E890 Postprocedural hypothyroidism: Secondary | ICD-10-CM | POA: Diagnosis not present

## 2018-12-18 DIAGNOSIS — I10 Essential (primary) hypertension: Secondary | ICD-10-CM | POA: Diagnosis not present

## 2018-12-18 DIAGNOSIS — Z0001 Encounter for general adult medical examination with abnormal findings: Secondary | ICD-10-CM

## 2018-12-18 DIAGNOSIS — Z13228 Encounter for screening for other metabolic disorders: Secondary | ICD-10-CM

## 2018-12-18 DIAGNOSIS — Z13220 Encounter for screening for lipoid disorders: Secondary | ICD-10-CM

## 2018-12-18 DIAGNOSIS — Z23 Encounter for immunization: Secondary | ICD-10-CM | POA: Diagnosis not present

## 2018-12-18 DIAGNOSIS — Z0000 Encounter for general adult medical examination without abnormal findings: Secondary | ICD-10-CM

## 2018-12-18 DIAGNOSIS — M5136 Other intervertebral disc degeneration, lumbar region: Secondary | ICD-10-CM | POA: Diagnosis not present

## 2018-12-18 DIAGNOSIS — Z1329 Encounter for screening for other suspected endocrine disorder: Secondary | ICD-10-CM

## 2018-12-18 DIAGNOSIS — Z130 Encounter for screening for diseases of the blood and blood-forming organs and certain disorders involving the immune mechanism: Secondary | ICD-10-CM

## 2018-12-18 MED ORDER — CETIRIZINE HCL 10 MG PO TABS: 10.0000 mg | ORAL_TABLET | Freq: Every day | ORAL | 3 refills | Status: DC

## 2018-12-18 MED ORDER — AMLODIPINE BESYLATE 5 MG PO TABS: ORAL_TABLET | ORAL | 3 refills | Status: DC

## 2018-12-18 MED ORDER — MELOXICAM 7.5 MG PO TABS: 7.5000 mg | ORAL_TABLET | Freq: Every day | ORAL | 5 refills | Status: DC

## 2018-12-18 NOTE — Addendum Note (Signed)
Addended by: Alfredia Ferguson A on: 12/18/2018 10:13 AM   Modules accepted: Orders

## 2018-12-18 NOTE — Progress Notes (Signed)
Thurmon Fair 63 y.o.   Chief Complaint  Patient presents with  . Annual Exam  . Medication Refill    ALL    HISTORY OF PRESENT ILLNESS: This is a 63 y.o. male here for annual exam.  Has the following chronic medical problems: 1.  Hypertension: On amlodipine 5 mg daily.  Doing well. 2.  Chronic allergies: Takes cetirizine 10 mg as needed. 3.  Arthritis: Takes Mobic 7.5 mg as needed. 4.  History of thyroid surgery due to suspected cancer, has postsurgical hypothyroidism, takes Synthroid 150 mcg daily.  Doing well. 5.  ED: On sildenafil 100 mg as needed. Doing well.  Has no complaints or medical concerns today. Has a family history of colon cancer.  Gets colonoscopies every 5 years.  So far negative results.  HPI   Prior to Admission medications   Medication Sig Start Date End Date Taking? Authorizing Provider  amLODipine (NORVASC) 5 MG tablet TAKE 1 TABLET DAILY 01/26/18  Yes Rodert Hinch, Ines Bloomer, MD  aspirin EC 81 MG tablet Take 81 mg by mouth daily.   Yes [provider]  calcium carbonate 1250 MG capsule Take 1,250 mg by mouth 2 (two) times daily with a meal. Per pt it is citracal plus D   Yes [provider]  cetirizine (ZYRTEC) 10 MG tablet Take 1 tablet (10 mg total) by mouth daily. 01/26/18  Yes Maryon Kemnitz, Ines Bloomer, MD  Krill Oil (MAXIMUM RED KRILL PO) Take by mouth.   Yes [provider]  levothyroxine (SYNTHROID, LEVOTHROID) 150 MCG tablet Take 1 tablet (150 mcg total) by mouth daily. 01/26/18  Yes Cylan Borum, Ines Bloomer, MD  meloxicam (MOBIC) 7.5 MG tablet Take 1 tablet (7.5 mg total) by mouth daily. 01/26/18  Yes Maryalyce Sanjuan, Ines Bloomer, MD  Multiple Vitamins-Minerals (MEGA MULTI MEN PO) Take by mouth daily. GNC 50 PLUS   Yes [provider]  omeprazole (PRILOSEC) 20 MG capsule Take 1 capsule (20 mg total) by mouth daily. 01/26/18 01/26/19 Yes Arias Weinert, Ines Bloomer, MD  sildenafil (VIAGRA) 100 MG tablet TAKE 1 TABLET DAILY AS NEEDED FOR  ERECTILE DYSFUNCTION AT LEAST 1 HOUR PRIOR TO SEX. 01/26/18  Yes Zanovia Rotz, Ines Bloomer, MD  vitamin C (ASCORBIC ACID) 500 MG tablet Take 500 mg by mouth daily.    Yes [provider]  saw palmetto 160 MG capsule Take 160 mg by mouth daily.    [provider]    No Known Allergies  Patient Active Problem List   Diagnosis Date Noted  . DDD (degenerative disc disease), lumbar 11/20/2013  . Family history of colon cancer 12/11/2011  . Cancer of thyroid ([place]) 12/11/2011  . Hypothyroidism 08/16/2011  . ED (erectile dysfunction) 08/16/2011    Past Medical History:  Diagnosis Date  . Arthritis   . Cancer ([place])   . Hypothyroidism   . Seasonal allergies     Past Surgical History:  Procedure Laterality Date  . HERNIA REPAIR     13 yrs ago  . rupture quad tenden    . THYROIDECTOMY     4 or 5 yrs ago    Social History   Socioeconomic History  . Marital status: Married    Spouse name: Not on file  . Number of children: Not on file  . Years of education: Not on file  . Highest education level: Not on file  Occupational History  . Occupation: Market researcher  . Financial resource strain: Not on file  . Food insecurity  Worry: Not on file    Inability: Not on file  . Transportation needs    Medical: Not on file    Non-medical: Not on file  Tobacco Use  . Smoking status: Former Smoker    Years: 18.00    Types: Cigarettes    Quit date: 12/15/1991    Years since quitting: 27.0  . Smokeless tobacco: Never Used  . Tobacco comment: also smoked marijuana,and cocaine (free-base)   Substance and Sexual Activity  . Alcohol use: No    Alcohol/week: 0.0 standard drinks  . Drug use: No  . Sexual activity: Not on file  Lifestyle  . Physical activity    Days per week: Not on file    Minutes per session: Not on file  . Stress: Not on file  Relationships  . Social Herbalist on phone: Not on file    Gets together: Not on file    Attends  religious service: Not on file    Active member of club or organization: Not on file    Attends meetings of clubs or organizations: Not on file    Relationship status: Not on file  . Intimate partner violence    Fear of current or ex partner: Not on file    Emotionally abused: Not on file    Physically abused: Not on file    Forced sexual activity: Not on file  Other Topics Concern  . Not on file  Social History Narrative   Exercise cardio and weights 4 days/wk for 1 hour    Family History  Problem Relation Age of Onset  . Cancer Mother        lung  . Cancer Father        rectal  . Kidney disease Maternal Grandmother        dialysis  . Hypertension Maternal Grandmother   . Hyperlipidemia Maternal Grandmother   . Arthritis Maternal Grandfather      Review of Systems  Constitutional: Negative.  Negative for fever.  HENT: Negative.  Negative for congestion and sore throat.   Eyes: Negative.   Respiratory: Negative.  Negative for cough and shortness of breath.   Cardiovascular: Negative.  Negative for chest pain and palpitations.  Gastrointestinal: Negative.  Negative for abdominal pain, diarrhea, nausea and vomiting.  Genitourinary: Negative.  Negative for dysuria and hematuria.  Musculoskeletal: Negative.  Negative for back pain, myalgias and neck pain.  Skin: Negative.  Negative for rash.  Neurological: Negative.  Negative for dizziness and headaches.  All other systems reviewed and are negative.     Vitals:   12/18/18 0912  BP: 129/81  Pulse: 68  Resp: 16  Temp: 98 F (36.7 C)  SpO2: 100%    Physical Exam Vitals signs reviewed.  HENT:     Head: Normocephalic.  Eyes:     Extraocular Movements: Extraocular movements intact.     Conjunctiva/sclera: Conjunctivae normal.     Pupils: Pupils are equal, round, and reactive to light.  Neck:     Musculoskeletal: Normal range of motion and neck supple.  Cardiovascular:     Rate and Rhythm: Normal rate and regular  rhythm.     Pulses: Normal pulses.     Heart sounds: Normal heart sounds.  Pulmonary:     Effort: Pulmonary effort is normal.     Breath sounds: Normal breath sounds.  Abdominal:     General: There is no distension.     Palpations: Abdomen is soft.  Tenderness: There is no abdominal tenderness.  Musculoskeletal: Normal range of motion.        General: No swelling or tenderness.     Right lower leg: No edema.     Left lower leg: No edema.  Lymphadenopathy:     Cervical: No cervical adenopathy.  Skin:    General: Skin is warm and dry.     Capillary Refill: Capillary refill takes less than 2 seconds.  Neurological:     General: No focal deficit present.     Mental Status: He is alert and oriented to person, place, and time.  Psychiatric:        Mood and Affect: Mood normal.        Behavior: Behavior normal.      ASSESSMENT & PLAN: Edith was seen today for annual exam and medication refill.  Diagnoses and all orders for this visit:  Routine general medical examination at a health care facility  Essential hypertension -     amLODipine (NORVASC) 5 MG tablet; TAKE 1 TABLET DAILY -     Comprehensive metabolic panel  Postoperative hypothyroidism  DDD (degenerative disc disease), lumbar -     meloxicam (MOBIC) 7.5 MG tablet; Take 1 tablet (7.5 mg total) by mouth daily.  Screening for deficiency anemia -     CBC with Differential/Platelet  Screening for lipoid disorders -     Lipid panel  Screening for endocrine, metabolic and immunity disorder -     Comprehensive metabolic panel -     Hemoglobin A1c -     TSH  Other orders -     cetirizine (ZYRTEC) 10 MG tablet; Take 1 tablet (10 mg total) by mouth daily.    Patient Instructions       If you have lab work done today you will be contacted with your lab results within the next 2 weeks.  If you have not heard from [place] then please contact us. The fastest way to get your results is to register for My Chart.    IF you received an x-ray today, you will receive an invoice from [facility]. Please contact [facility] at [phone] with questions or concerns regarding your invoice.   IF you received labwork today, you will receive an invoice from [facility]. Please contact [facility] at [phone] with questions or concerns regarding your invoice.   Our billing staff will not be able to assist you with questions regarding bills from these companies.  You will be contacted with the lab results as soon as they are available. The fastest way to get your results is to activate your My Chart account. Instructions are located on the last page of this paperwork. If you have not heard from [place] regarding the results in 2 weeks, please contact this office.      Health Maintenance, Male Adopting a healthy lifestyle and getting preventive care are important in promoting health and wellness. Ask your health care provider about:  The right schedule for you to have regular tests and exams.  Things you can do on your own to prevent diseases and keep yourself healthy. What should I know about diet, weight, and exercise? Eat a healthy diet   Eat a diet that includes plenty of vegetables, fruits, low-fat dairy products, and lean protein.  Do not eat a lot of foods that are high in solid fats, added sugars, or sodium. Maintain a healthy weight Body mass index (BMI) is a measurement that can be used to identify  possible weight problems. It estimates body fat based on height and weight. Your health care provider can help determine your BMI and help you achieve or maintain a healthy weight. Get regular exercise Get regular exercise. This is one of the most important things you can do for your health. Most adults should:  Exercise for at least 150 minutes each week. The exercise should increase your heart rate and make you sweat (moderate-intensity exercise).  Do strengthening exercises at least twice  a week. This is in addition to the moderate-intensity exercise.  Spend less time sitting. Even light physical activity can be beneficial. Watch cholesterol and blood lipids Have your blood tested for lipids and cholesterol at 65 years of age, then have this test every 5 years. You may need to have your cholesterol levels checked more often if:  Your lipid or cholesterol levels are high.  You are older than 65 years of age.  You are at high risk for heart disease. What should I know about cancer screening? Many types of cancers can be detected early and may often be prevented. Depending on your health history and family history, you may need to have cancer screening at various ages. This may include screening for:  Colorectal cancer.  Prostate cancer.  Skin cancer.  Lung cancer. What should I know about heart disease, diabetes, and high blood pressure? Blood pressure and heart disease  High blood pressure causes heart disease and increases the risk of stroke. This is more likely to develop in people who have high blood pressure readings, are of African descent, or are overweight.  Talk with your health care provider about your target blood pressure readings.  Have your blood pressure checked: ? Every 3-5 years if you are 61-78 years of age. ? Every year if you are 33 years old or older.  If you are between the ages of 38 and 49 and are a current or former smoker, ask your health care provider if you should have a one-time screening for abdominal aortic aneurysm (AAA). Diabetes Have regular diabetes screenings. This checks your fasting blood sugar level. Have the screening done:  Once every three years after age 82 if you are at a normal weight and have a low risk for diabetes.  More often and at a younger age if you are overweight or have a high risk for diabetes. What should I know about preventing infection? Hepatitis B If you have a higher risk for hepatitis B, you should  be screened for this virus. Talk with your health care provider to find out if you are at risk for hepatitis B infection. Hepatitis C Blood testing is recommended for:  Everyone born from [birth year] through 1967.  Anyone with known risk factors for hepatitis C. Sexually transmitted infections (STIs)  You should be screened each year for STIs, including gonorrhea and chlamydia, if: ? You are sexually active and are younger than 65 years of age. ? You are older than 65 years of age and your health care provider tells you that you are at risk for this type of infection. ? Your sexual activity has changed since you were last screened, and you are at increased risk for chlamydia or gonorrhea. Ask your health care provider if you are at risk.  Ask your health care provider about whether you are at high risk for HIV. Your health care provider may recommend a prescription medicine to help prevent HIV infection. If you choose to take medicine to prevent HIV,  you should first get tested for HIV. You should then be tested every 3 months for as long as you are taking the medicine. Follow these instructions at home: Lifestyle  Do not use any products that contain nicotine or tobacco, such as cigarettes, e-cigarettes, and chewing tobacco. If you need help quitting, ask your health care provider.  Do not use street drugs.  Do not share needles.  Ask your health care provider for help if you need support or information about quitting drugs. Alcohol use  Do not drink alcohol if your health care provider tells you not to drink.  If you drink alcohol: ? Limit how much you have to 0-2 drinks a day. ? Be aware of how much alcohol is in your drink. In the [place] one drink equals one 12 oz bottle of beer (355 mL), one 5 oz glass of wine (148 mL), or one 1 oz glass of hard liquor (44 mL). General instructions  Schedule regular health, dental, and eye exams.  Stay current with your vaccines.  Tell your health  care provider if: ? You often feel depressed. ? You have ever been abused or do not feel safe at home. Summary  Adopting a healthy lifestyle and getting preventive care are important in promoting health and wellness.  Follow your health care provider's instructions about healthy diet, exercising, and getting tested or screened for diseases.  Follow your health care provider's instructions on monitoring your cholesterol and blood pressure. This information is not intended to replace advice given to you by your health care provider. Make sure you discuss any questions you have with your health care provider. Document Released: 08/28/2007 Document Revised: 02/22/2018 Document Reviewed: 02/22/2018 Elsevier Patient Education  2020 Elsevier Inc.      Agustina Caroli, MD [facility]

## 2018-12-18 NOTE — Patient Instructions (Addendum)
   If you have lab work done today you will be contacted with your lab results within the next 2 weeks.  If you have not heard from us then please contact us. The fastest way to get your results is to register for My Chart.   IF you received an x-ray today, you will receive an invoice from [facility]. Please contact [facility] at [phone] with questions or concerns regarding your invoice.   IF you received labwork today, you will receive an invoice from [facility]. Please contact [facility] at [phone] with questions or concerns regarding your invoice.   Our billing staff will not be able to assist you with questions regarding bills from these companies.  You will be contacted with the lab results as soon as they are available. The fastest way to get your results is to activate your My Chart account. Instructions are located on the last page of this paperwork. If you have not heard from us regarding the results in 2 weeks, please contact this office.     Health Maintenance, Male Adopting a healthy lifestyle and getting preventive care are important in promoting health and wellness. Ask your health care provider about:  The right schedule for you to have regular tests and exams.  Things you can do on your own to prevent diseases and keep yourself healthy. What should I know about diet, weight, and exercise? Eat a healthy diet   Eat a diet that includes plenty of vegetables, fruits, low-fat dairy products, and lean protein.  Do not eat a lot of foods that are high in solid fats, added sugars, or sodium. Maintain a healthy weight Body mass index (BMI) is a measurement that can be used to identify possible weight problems. It estimates body fat based on height and weight. Your health care provider can help determine your BMI and help you achieve or maintain a healthy weight. Get regular exercise Get regular exercise. This is one of the most important things you  can do for your health. Most adults should:  Exercise for at least 150 minutes each week. The exercise should increase your heart rate and make you sweat (moderate-intensity exercise).  Do strengthening exercises at least twice a week. This is in addition to the moderate-intensity exercise.  Spend less time sitting. Even light physical activity can be beneficial. Watch cholesterol and blood lipids Have your blood tested for lipids and cholesterol at 65 years of age, then have this test every 5 years. You may need to have your cholesterol levels checked more often if:  Your lipid or cholesterol levels are high.  You are older than 65 years of age.  You are at high risk for heart disease. What should I know about cancer screening? Many types of cancers can be detected early and may often be prevented. Depending on your health history and family history, you may need to have cancer screening at various ages. This may include screening for:  Colorectal cancer.  Prostate cancer.  Skin cancer.  Lung cancer. What should I know about heart disease, diabetes, and high blood pressure? Blood pressure and heart disease  High blood pressure causes heart disease and increases the risk of stroke. This is more likely to develop in people who have high blood pressure readings, are of African descent, or are overweight.  Talk with your health care provider about your target blood pressure readings.  Have your blood pressure checked: ? Every 3-5 years if you are 18-39 years   of age. ? Every year if you are 40 years old or older.  If you are between the ages of 65 and 75 and are a current or former smoker, ask your health care provider if you should have a one-time screening for abdominal aortic aneurysm (AAA). Diabetes Have regular diabetes screenings. This checks your fasting blood sugar level. Have the screening done:  Once every three years after age 45 if you are at a normal weight and have  a low risk for diabetes.  More often and at a younger age if you are overweight or have a high risk for diabetes. What should I know about preventing infection? Hepatitis B If you have a higher risk for hepatitis B, you should be screened for this virus. Talk with your health care provider to find out if you are at risk for hepatitis B infection. Hepatitis C Blood testing is recommended for:  Everyone born from 1945 through 1965.  Anyone with known risk factors for hepatitis C. Sexually transmitted infections (STIs)  You should be screened each year for STIs, including gonorrhea and chlamydia, if: ? You are sexually active and are younger than 65 years of age. ? You are older than 65 years of age and your health care provider tells you that you are at risk for this type of infection. ? Your sexual activity has changed since you were last screened, and you are at increased risk for chlamydia or gonorrhea. Ask your health care provider if you are at risk.  Ask your health care provider about whether you are at high risk for HIV. Your health care provider may recommend a prescription medicine to help prevent HIV infection. If you choose to take medicine to prevent HIV, you should first get tested for HIV. You should then be tested every 3 months for as long as you are taking the medicine. Follow these instructions at home: Lifestyle  Do not use any products that contain nicotine or tobacco, such as cigarettes, e-cigarettes, and chewing tobacco. If you need help quitting, ask your health care provider.  Do not use street drugs.  Do not share needles.  Ask your health care provider for help if you need support or information about quitting drugs. Alcohol use  Do not drink alcohol if your health care provider tells you not to drink.  If you drink alcohol: ? Limit how much you have to 0-2 drinks a day. ? Be aware of how much alcohol is in your drink. In the [place] one drink equals one 12  oz bottle of beer (355 mL), one 5 oz glass of wine (148 mL), or one 1 oz glass of hard liquor (44 mL). General instructions  Schedule regular health, dental, and eye exams.  Stay current with your vaccines.  Tell your health care provider if: ? You often feel depressed. ? You have ever been abused or do not feel safe at home. Summary  Adopting a healthy lifestyle and getting preventive care are important in promoting health and wellness.  Follow your health care provider's instructions about healthy diet, exercising, and getting tested or screened for diseases.  Follow your health care provider's instructions on monitoring your cholesterol and blood pressure. This information is not intended to replace advice given to you by your health care provider. Make sure you discuss any questions you have with your health care provider. Document Released: 08/28/2007 Document Revised: 02/22/2018 Document Reviewed: 02/22/2018 Elsevier Patient Education  2020 Elsevier Inc.  

## 2018-12-19 ENCOUNTER — Encounter: Payer: Self-pay | Admitting: Emergency Medicine

## 2018-12-19 LAB — HEMOGLOBIN A1C
Est. average glucose Bld gHb Est-mCnc: 108 mg/dL
Hgb A1c MFr Bld: 5.4 % (ref 4.8–5.6)

## 2018-12-19 LAB — LIPID PANEL
Chol/HDL Ratio: 2.8 ratio (ref 0.0–5.0)
Cholesterol, Total: 179 mg/dL (ref 100–199)
HDL: 65 mg/dL (ref >39)
LDL Chol Calc (NIH): 97 mg/dL (ref 0–99)
Triglycerides: 97 mg/dL (ref 0–149)
VLDL Cholesterol Cal: 17 mg/dL (ref 5–40)

## 2018-12-19 LAB — COMPREHENSIVE METABOLIC PANEL
ALT: 21 IU/L (ref 0–44)
AST: 29 IU/L (ref 0–40)
Albumin/Globulin Ratio: 1.7 (ref 1.2–2.2)
Albumin: 4.2 g/dL (ref 3.8–4.8)
Alkaline Phosphatase: 70 IU/L (ref 39–117)
BUN/Creatinine Ratio: 11 (ref 10–24)
BUN: 15 mg/dL (ref 8–27)
Bilirubin Total: 0.8 mg/dL (ref 0.0–1.2)
CO2: 25 mmol/L (ref 20–29)
Calcium: 8.9 mg/dL (ref 8.6–10.2)
Chloride: 103 mmol/L (ref 96–106)
Creatinine, Ser: 1.31 mg/dL — ABNORMAL HIGH (ref 0.76–1.27)
GFR calc Af Amer: 66 mL/min/{1.73_m2} (ref >59)
GFR calc non Af Amer: 57 mL/min/{1.73_m2} — ABNORMAL LOW (ref >59)
Globulin, Total: 2.5 g/dL (ref 1.5–4.5)
Glucose: 76 mg/dL (ref 65–99)
Potassium: 4.6 mmol/L (ref 3.5–5.2)
Sodium: 138 mmol/L (ref 134–144)
Total Protein: 6.7 g/dL (ref 6.0–8.5)

## 2018-12-19 LAB — CBC WITH DIFFERENTIAL/PLATELET
Basophils Absolute: 0 10*3/uL (ref 0.0–0.2)
Basos: 1 %
EOS (ABSOLUTE): 0.6 10*3/uL — ABNORMAL HIGH (ref 0.0–0.4)
Eos: 16 %
Hematocrit: 40.3 % (ref 37.5–51.0)
Hemoglobin: 13.6 g/dL (ref 13.0–17.7)
Immature Grans (Abs): 0 10*3/uL (ref 0.0–0.1)
Immature Granulocytes: 0 %
Lymphocytes Absolute: 1.3 10*3/uL (ref 0.7–3.1)
Lymphs: 35 %
MCH: 27.4 pg (ref 26.6–33.0)
MCHC: 33.7 g/dL (ref 31.5–35.7)
MCV: 81 fL (ref 79–97)
Monocytes Absolute: 0.4 10*3/uL (ref 0.1–0.9)
Monocytes: 10 %
Neutrophils Absolute: 1.5 10*3/uL (ref 1.4–7.0)
Neutrophils: 38 %
Platelets: 171 10*3/uL (ref 150–450)
RBC: 4.97 x10E6/uL (ref 4.14–5.80)
RDW: 14.6 % (ref 11.6–15.4)
WBC: 3.9 10*3/uL (ref 3.4–10.8)

## 2018-12-19 LAB — TSH: TSH: 0.965 u[IU]/mL (ref 0.450–4.500)

## 2018-12-21 ENCOUNTER — Other Ambulatory Visit: Payer: Self-pay | Admitting: Emergency Medicine

## 2018-12-21 DIAGNOSIS — E890 Postprocedural hypothyroidism: Secondary | ICD-10-CM

## 2018-12-21 MED ORDER — LEVOTHYROXINE SODIUM 150 MCG PO TABS: 150.0000 ug | ORAL_TABLET | Freq: Every day | ORAL | 3 refills | Status: DC

## 2018-12-21 NOTE — Telephone Encounter (Signed)
Sure. What pharmacy? Local or mail?

## 2018-12-23 ENCOUNTER — Other Ambulatory Visit: Payer: Self-pay | Admitting: Emergency Medicine

## 2018-12-23 DIAGNOSIS — E890 Postprocedural hypothyroidism: Secondary | ICD-10-CM

## 2018-12-23 MED ORDER — LEVOTHYROXINE SODIUM 150 MCG PO TABS: 150.0000 ug | ORAL_TABLET | Freq: Every day | ORAL | 3 refills | Status: DC

## 2018-12-23 NOTE — Telephone Encounter (Signed)
Prescription sent. Thanks

## 2019-03-19 ENCOUNTER — Ambulatory Visit: Payer: BC Managed Care – PPO | Attending: Internal Medicine

## 2019-03-19 ENCOUNTER — Other Ambulatory Visit: Payer: Self-pay

## 2019-03-19 DIAGNOSIS — Z20822 Contact with and (suspected) exposure to covid-19: Secondary | ICD-10-CM

## 2019-03-21 ENCOUNTER — Encounter: Payer: Self-pay | Admitting: *Deleted

## 2019-03-21 LAB — NOVEL CORONAVIRUS, NAA: SARS-CoV-2, NAA: DETECTED — AB

## 2019-05-18 ENCOUNTER — Ambulatory Visit: Payer: BC Managed Care – PPO | Attending: Internal Medicine

## 2019-05-18 ENCOUNTER — Ambulatory Visit: Payer: BC Managed Care – PPO

## 2019-05-18 DIAGNOSIS — Z23 Encounter for immunization: Secondary | ICD-10-CM | POA: Insufficient documentation

## 2019-05-18 NOTE — Progress Notes (Signed)
   Covid-19 Vaccination Clinic  Name:  Jerry Freeman    MRN: [id number] DOB: 10/28/1955  05/18/2019  [name] was observed post Covid-19 immunization for 15 minutes without incident. He was provided with Vaccine Information Sheet and instruction to access the V-Safe system.   [name] was instructed to call 911 with any severe reactions post vaccine: Marland Kitchen Difficulty breathing  . Swelling of face and throat  . A fast heartbeat  . A bad rash all over body  . Dizziness and weakness   Immunizations Administered    Name Date Dose VIS Date Route   Pfizer COVID-19 Vaccine 05/18/2019  4:43 PM 0.3 mL 02/23/2019 Intramuscular   Manufacturer: [place]   Lot: [id number]   [place]: [id number]

## 2019-05-31 ENCOUNTER — Encounter: Payer: Self-pay | Admitting: Emergency Medicine

## 2019-05-31 LAB — HM COLONOSCOPY

## 2019-06-05 ENCOUNTER — Encounter: Payer: Self-pay | Admitting: *Deleted

## 2019-06-20 ENCOUNTER — Ambulatory Visit: Payer: BC Managed Care – PPO | Attending: Internal Medicine

## 2019-06-20 DIAGNOSIS — Z23 Encounter for immunization: Secondary | ICD-10-CM

## 2019-06-20 NOTE — Progress Notes (Signed)
   Covid-19 Vaccination Clinic  Name:  Jerry Freeman    MRN: [id number] DOB: March 10, 1956  06/20/2019  [name] was observed post Covid-19 immunization for 15 minutes without incident. He was provided with Vaccine Information Sheet and instruction to access the V-Safe system.   [name] was instructed to call 911 with any severe reactions post vaccine: Marland Kitchen Difficulty breathing  . Swelling of face and throat  . A fast heartbeat  . A bad rash all over body  . Dizziness and weakness   Immunizations Administered    Name Date Dose VIS Date Route   Pfizer COVID-19 Vaccine 06/20/2019  1:03 PM 0.3 mL 02/23/2019 Intramuscular   Manufacturer: [place]   Lot: [id number]   [place]: [id number]

## 2019-09-19 ENCOUNTER — Emergency Department (HOSPITAL_COMMUNITY)
Admission: EM | Admit: 2019-09-19 | Discharge: 2019-09-19 | Disposition: A | Discharge status: Home / Self Care | Payer: BC Managed Care – PPO | Attending: Emergency Medicine | Admitting: Emergency Medicine

## 2019-09-19 ENCOUNTER — Other Ambulatory Visit: Payer: Self-pay

## 2019-09-19 ENCOUNTER — Emergency Department (HOSPITAL_COMMUNITY): Payer: BC Managed Care – PPO

## 2019-09-19 ENCOUNTER — Encounter (HOSPITAL_COMMUNITY): Payer: Self-pay | Admitting: *Deleted

## 2019-09-19 DIAGNOSIS — E039 Hypothyroidism, unspecified: Secondary | ICD-10-CM | POA: Insufficient documentation

## 2019-09-19 DIAGNOSIS — Z85850 Personal history of malignant neoplasm of thyroid: Secondary | ICD-10-CM | POA: Insufficient documentation

## 2019-09-19 DIAGNOSIS — M25561 Pain in right knee: Secondary | ICD-10-CM | POA: Diagnosis present

## 2019-09-19 DIAGNOSIS — Z87891 Personal history of nicotine dependence: Secondary | ICD-10-CM | POA: Insufficient documentation

## 2019-09-19 DIAGNOSIS — Y9389 Activity, other specified: Secondary | ICD-10-CM | POA: Diagnosis not present

## 2019-09-19 DIAGNOSIS — Y999 Unspecified external cause status: Secondary | ICD-10-CM | POA: Insufficient documentation

## 2019-09-19 DIAGNOSIS — W19XXXA Unspecified fall, initial encounter: Secondary | ICD-10-CM

## 2019-09-19 DIAGNOSIS — W010XXA Fall on same level from slipping, tripping and stumbling without subsequent striking against object, initial encounter: Secondary | ICD-10-CM | POA: Diagnosis not present

## 2019-09-19 DIAGNOSIS — Y92007 Garden or yard of unspecified non-institutional (private) residence as the place of occurrence of the external cause: Secondary | ICD-10-CM | POA: Diagnosis not present

## 2019-09-19 NOTE — Discharge Instructions (Signed)
You have been seen for right knee pain.  I recommend you wear a brace on the knee as this will help keep it immobilized.  I also want you to alternate between taking ibuprofen and Tylenol for pain for example you can take ibuprofen first wait 6 hours then take Tylenol wait another 6 hours then repeat.  Please follow dosing on the back of bottle.  I also want to apply ice to the area and keep it elevated as this will help with inflammation and swelling.  Please do not apply ice on bare skin as this can cause a burn.  Please use a walker or crutch to help minimize leg movement.  I want you to follow-up with an orthopedic doctor for further evaluation management I provided contact information for one please call at your earliest convenience.  I want to come back to the emergency department if you develop numbness or tingling in your foot, your leg turns a different color, you have excruciating or worsening knee pain, you develop shortness of breath, chest pain, uncontrolled nausea, vomiting, diarrhea as you symptoms require further evaluation and management.

## 2019-09-19 NOTE — ED Triage Notes (Signed)
Pt slipped on grass on a hill this morning, c/o right knee pain since.  Pt states knee went underneath him with the fall.  Denies hitting head.  Takes ASA daily but not today.

## 2019-09-19 NOTE — ED Provider Notes (Signed)
[facility] EMERGENCY DEPARTMENT Provider Note   CSN: [id number] Arrival date & time: 09/19/19  1056     History Chief Complaint  Patient presents with  . Fall    Jerry Freeman is a 64 y.o. male.  HPI   Patient presents to the emergency department with chief complaint of right knee pain after he fell while mowing the lawn.  Patient states that while he is on a hill his feet slipped from underneath him causing him to fall and twisted his right knee.  Patient describes the pain as a dull constant pain.  He admits that bending his knee and  placing weight on it makes the pain worse nothing seems to make it better. Patient denies hitting his head, losing conscious, not on any blood thinners.  medical history includes arthritis, cancer, seasonal allergies.  Patient denies headache, fever, chills, shortness of breath, chest pain, nausea, vomiting, pedal edema.  Past Medical History:  Diagnosis Date  . Arthritis   . Cancer ([place])   . Hypothyroidism   . Seasonal allergies     Patient Active Problem List   Diagnosis Date Noted  . DDD (degenerative disc disease), lumbar 11/20/2013  . Family history of colon cancer 12/11/2011  . Cancer of thyroid ([place]) 12/11/2011  . Hypothyroidism 08/16/2011  . ED (erectile dysfunction) 08/16/2011    Past Surgical History:  Procedure Laterality Date  . HERNIA REPAIR     13 yrs ago  . rupture quad tenden    . THYROIDECTOMY     4 or 5 yrs ago       Family History  Problem Relation Age of Onset  . Cancer Mother        lung  . Cancer Father        rectal  . Kidney disease Maternal Grandmother        dialysis  . Hypertension Maternal Grandmother   . Hyperlipidemia Maternal Grandmother   . Arthritis Maternal Grandfather   . High Cholesterol Brother   . Hypertension Brother   . Stroke Brother     Social History   Tobacco Use  . Smoking status: Former Smoker    Years: 18.00    Types: Cigarettes    Quit date: 12/15/1991    Years since  quitting: 27.7  . Smokeless tobacco: Never Used  . Tobacco comment: also smoked marijuana,and cocaine (free-base)   Substance Use Topics  . Alcohol use: No    Alcohol/week: 0.0 standard drinks  . Drug use: No    Home Medications Prior to Admission medications   Medication Sig Start Date End Date Taking? Authorizing Provider  amLODipine (NORVASC) 5 MG tablet TAKE 1 TABLET DAILY 12/18/18   Horald Pollen, MD  aspirin EC 81 MG tablet Take 81 mg by mouth daily.    [provider]  calcium carbonate 1250 MG capsule Take 1,250 mg by mouth 2 (two) times daily with a meal. Per pt it is citracal plus D    [provider]  cetirizine (ZYRTEC) 10 MG tablet Take 1 tablet (10 mg total) by mouth daily. 12/18/18   Horald Pollen, MD  Krill Oil (MAXIMUM RED KRILL PO) Take by mouth.    [provider]  levothyroxine (SYNTHROID) 150 MCG tablet Take 1 tablet (150 mcg total) by mouth daily. 12/23/18   Horald Pollen, MD  meloxicam (MOBIC) 7.5 MG tablet Take 1 tablet (7.5 mg total) by mouth daily. 12/18/18   Horald Pollen, MD  Multiple  Vitamins-Minerals (MEGA MULTI MEN PO) Take by mouth daily. [place] 50 PLUS    [provider]  saw palmetto 160 MG capsule Take 160 mg by mouth daily.    [provider]  sildenafil (VIAGRA) 100 MG tablet TAKE 1 TABLET DAILY AS NEEDED FOR ERECTILE DYSFUNCTION AT LEAST 1 HOUR PRIOR TO SEX. 01/26/18   Horald Pollen, MD  vitamin C (ASCORBIC ACID) 500 MG tablet Take 500 mg by mouth daily.     [provider]    Allergies    Patient has no known allergies.  Review of Systems   Review of Systems  Constitutional: Negative for chills and fever.  HENT: Negative for congestion, sore throat and tinnitus.   Eyes: Negative for pain and visual disturbance.  Respiratory: Negative for shortness of breath.   Cardiovascular: Negative for chest pain.  Gastrointestinal: Negative for abdominal pain, diarrhea,  nausea and vomiting.  Genitourinary: Negative for dysuria, enuresis and flank pain.  Musculoskeletal: Negative for back pain.       AdMitts to right knee pain.  Skin: Negative for rash.  Neurological: Negative for dizziness, facial asymmetry and headaches.  Hematological: Does not bruise/bleed easily.    Physical Exam Updated Vital Signs BP (!) 147/98   Pulse 64   Temp (!) 96.6 F (35.9 C) (Temporal)   Resp 20   Ht 6\' 1"  (1.854 m)   Wt 97.5 kg   SpO2 98%   BMI 28.37 kg/m   Physical Exam Vitals and nursing note reviewed.  Constitutional:      General: He is not in acute distress.    Appearance: Normal appearance. He is not ill-appearing or diaphoretic.  HENT:     Head: Normocephalic and atraumatic.     Nose: No congestion or rhinorrhea.  Eyes:     General: No scleral icterus.       Right eye: No discharge.        Left eye: No discharge.     Conjunctiva/sclera: Conjunctivae normal.  Pulmonary:     Effort: Pulmonary effort is normal. No respiratory distress.     Breath sounds: Normal breath sounds. No wheezing.  Musculoskeletal:     Cervical back: Neck supple.     Right lower leg: No edema.     Left lower leg: No edema.     Comments: Patient's right knee was visualized, there was slight edema, no rashes, erythema, lacerations, abrasions, or other gross abnormalities noted.  Patient had full range of motion, 4-5 strength due to pain, good pedal pulses, good capillary refill, sensation fully intact.  Skin:    General: Skin is warm and dry.     Coloration: Skin is not jaundiced or pale.  Neurological:     Mental Status: He is alert and oriented to person, place, and time.  Psychiatric:        Mood and Affect: Mood normal.     ED Results / Procedures / Treatments   Labs (all labs ordered are listed, but only abnormal results are displayed) Labs Reviewed - No data to display  EKG None  Radiology DG Knee Right Port  Result Date: 09/19/2019 CLINICAL DATA:  Fall,  right knee pain EXAM: PORTABLE RIGHT KNEE - 1-2 VIEW COMPARISON:  None. FINDINGS: Two view radiograph of the right knee demonstrates irregularity involving the superior pole of the patella, heterotopic ossification and soft tissue thickening in the region of the distal right thigh anteriorly, and thinning of the soft tissues in the region the  expected quadriceps tendons suggesting a strain or tear of the quadriceps tendon. Corticated densities may relate to remote trauma or a remote avulsion involving the superior pole of patella. The patella is normally position, however, suggesting an incomplete tear of the a quadriceps tendon. There is moderate prepatellar soft tissue swelling. No acute fracture or dislocation. Medial and lateral joint spaces are preserved. Patellofemoral joint space appears widened, of unclear significance. IMPRESSION: Suspected partial tear of the quadriceps tendon. Possible remote superior patellar avulsion fracture. No acute fracture or dislocation. Prepatellar soft tissue swelling. Electronically Signed   By: Fidela Salisbury MD   On: 09/19/2019 12:15    Procedures Procedures (including critical care time)  Medications Ordered in ED Medications - No data to display  ED Course  I have reviewed the triage vital signs and the nursing notes.  Pertinent labs & imaging results that were available during my care of the patient were reviewed by me and considered in my medical decision making (see chart for details).    MDM Rules/Calculators/A&P                          I have personally reviewed all imaging, labs and have interpreted them.  Due to patient's complaint most concern for fracture, dislocation, quadricep tendon tear, compartment syndrome.  Unlikely patient suffering from compartment as patient's knee was soft to the touch, good pedal pulses, good capillary refill, denies paresthesia.  Unlikely patient suffering from dislocation or fracture as x-ray did not show either.   X-ray did side suspect a partial tear of the quadricep tendon with possible superior patellar avulsion fracture.  That reading does not line up with  physical exam as there was no low riding patella, patient was able to flex at the knee without difficulty, patient was nontender around quadricep tendon.  Patient was nontoxic-appearing, vital signs stable, their exam was benign no indication for further imaging or labs.  Patient was placed in a brace.  Patient appears to be resting comfortably in bed, showing no acute signs distress.  Patient's vitals have remained stable does not meet criteria to be admitted to the hospital.  Likely patient suffered a ligament strain/tear versus tendon strain/tear versus muscle strain.  Recommend patient follows up with orthopedic for further evaluation and management.  Patient was discussed with attending who agrees with assessment and plan.  Patient was given at home instructions while strict return precautions.  Patient verbalized that he is angry and said plan. Final Clinical Impression(s) / ED Diagnoses Final diagnoses:  Fall  Fall, initial encounter  Acute pain of right knee    Rx / DC Orders ED Discharge Orders    None       Aariz, Maish, PA-C 09/19/19 1400    Davonna Belling, MD 09/19/19 [phone]

## 2019-09-20 ENCOUNTER — Ambulatory Visit: Payer: BC Managed Care – PPO | Admitting: Orthopedic Surgery

## 2019-09-24 ENCOUNTER — Other Ambulatory Visit: Payer: Self-pay

## 2019-09-24 ENCOUNTER — Encounter (HOSPITAL_COMMUNITY): Payer: Self-pay | Admitting: Specialist

## 2019-09-24 NOTE — Progress Notes (Signed)
Need orders in epic.  Surgery on 09/28/19.

## 2019-09-25 ENCOUNTER — Encounter (HOSPITAL_COMMUNITY)
Admission: RE | Admit: 2019-09-25 | Discharge: 2019-09-25 | Disposition: A | Discharge status: Home / Self Care | Payer: BC Managed Care – PPO | Source: Ambulatory Visit | Attending: Specialist | Admitting: Specialist

## 2019-09-25 ENCOUNTER — Other Ambulatory Visit (HOSPITAL_COMMUNITY)
Admission: RE | Admit: 2019-09-25 | Discharge: 2019-09-25 | Disposition: A | Discharge status: Home / Self Care | Payer: BC Managed Care – PPO | Source: Ambulatory Visit | Attending: Specialist | Admitting: Specialist

## 2019-09-25 DIAGNOSIS — Z20822 Contact with and (suspected) exposure to covid-19: Secondary | ICD-10-CM | POA: Diagnosis not present

## 2019-09-25 DIAGNOSIS — Z01818 Encounter for other preprocedural examination: Secondary | ICD-10-CM | POA: Diagnosis present

## 2019-09-25 LAB — CBC
HCT: 42.3 % (ref 39.0–52.0)
Hemoglobin: 13.7 g/dL (ref 13.0–17.0)
MCH: 27 pg (ref 26.0–34.0)
MCHC: 32.4 g/dL (ref 30.0–36.0)
MCV: 83.4 fL (ref 80.0–100.0)
Platelets: 176 10*3/uL (ref 150–400)
RBC: 5.07 MIL/uL (ref 4.22–5.81)
RDW: 15.8 % — ABNORMAL HIGH (ref 11.5–15.5)
WBC: 4.1 10*3/uL (ref 4.0–10.5)
nRBC: 0 % (ref 0.0–0.2)

## 2019-09-25 LAB — BASIC METABOLIC PANEL
Anion gap: 6 (ref 5–15)
BUN: 17 mg/dL (ref 8–23)
CO2: 28 mmol/L (ref 22–32)
Calcium: 8.6 mg/dL — ABNORMAL LOW (ref 8.9–10.3)
Chloride: 102 mmol/L (ref 98–111)
Creatinine, Ser: 1.04 mg/dL (ref 0.61–1.24)
GFR calc Af Amer: >60 mL/min (ref >60)
GFR calc non Af Amer: >60 mL/min (ref >60)
Glucose, Bld: 94 mg/dL (ref 70–99)
Potassium: 4.2 mmol/L (ref 3.5–5.1)
Sodium: 136 mmol/L (ref 135–145)

## 2019-09-25 LAB — SARS CORONAVIRUS 2 (TAT 6-24 HRS): SARS Coronavirus 2: NEGATIVE

## 2019-09-26 NOTE — H&P (Signed)
Jerry Freeman is an 64 y.o. male.   Chief Complaint: Right knee pain HPI: this is a very pleasant 64 year old male who was evaluated for a right quad tendon rupture.  Last week he was mowing his yard when he slipped and fell and injured his right knee.  He was seen at our orthopedic walk-in clinic.  He is not really having any pain in his right knee.  He is having a lot of difficulty with ambulation.  He is currently using a crutch.   Past Medical History:  Diagnosis Date  . Arthritis   . Cancer ([place])    thyroid   . Hypertension   . Hypothyroidism   . Seasonal allergies     Past Surgical History:  Procedure Laterality Date  . HERNIA REPAIR     13 yrs ago  . rupture quad tenden    . THYROIDECTOMY     4 or 5 yrs ago    Family History  Problem Relation Age of Onset  . Cancer Mother        lung  . Cancer Father        rectal  . Kidney disease Maternal Grandmother        dialysis  . Hypertension Maternal Grandmother   . Hyperlipidemia Maternal Grandmother   . Arthritis Maternal Grandfather   . High Cholesterol Brother   . Hypertension Brother   . Stroke Brother    Social History:  reports that he quit smoking about 27 years ago. His smoking use included cigarettes. He quit after 18.00 years of use. He has never used smokeless tobacco. He reports previous alcohol use. He reports previous drug use.  Allergies: No Known Allergies  No medications prior to admission.    Results for orders placed or performed during the hospital encounter of 09/25/19 (from the past 48 hour(s))  SARS CORONAVIRUS 2 (TAT 6-24 HRS) Nasopharyngeal Nasopharyngeal Swab     Status: None   Collection Time: 09/25/19  8:33 AM   Specimen: Nasopharyngeal Swab  Result Value Ref Range   SARS Coronavirus 2 NEGATIVE NEGATIVE    Comment: (NOTE) SARS-CoV-2 target nucleic acids are NOT DETECTED.  The SARS-CoV-2 RNA is generally detectable in upper and lower respiratory specimens during the acute phase of  infection. Negative results do not preclude SARS-CoV-2 infection, do not rule out co-infections with other pathogens, and should not be used as the sole basis for treatment or other patient management decisions. Negative results must be combined with clinical observations, patient history, and epidemiological information. The expected result is Negative.  Fact Sheet for Patients: SugarRoll.be  Fact Sheet for Healthcare Providers: https://www.woods-mathews.com/  This test is not yet approved or cleared by the [place] FDA and  has been authorized for detection and/or diagnosis of SARS-CoV-2 by FDA under an Emergency Use Authorization (EUA). This EUA will remain  in effect (meaning this test can be used) for the duration of the COVID-19 declaration under Se ction 564(b)(1) of the Act, 21 U.S.C. section 360bbb-3(b)(1), unless the authorization is terminated or revoked sooner.  Performed at [facility], [place] [address], [place], [place] [address]    No results found.  Review of Systems  All other systems reviewed and are negative.   There were no vitals taken for this visit. Physical Exam Vitals reviewed.  Constitutional:      Appearance: Normal appearance. He is normal weight.  HENT:     Head: Normocephalic and atraumatic.  Musculoskeletal:  Comments: Significant effusion of right knee Unable to do a SLR Palpable deficit of quad tendon at attachment to patella NVI in right lower extremity  Skin:    General: Skin is warm and dry.  Neurological:     General: No focal deficit present.     Mental Status: He is alert and oriented to person, place, and time. Mental status is at baseline.  Psychiatric:        Mood and Affect: Mood normal.        Behavior: Behavior normal.        Thought Content: Thought content normal.        Judgment: Judgment normal.      Assessment/Plan Right Quadriceps tendon  rupture:  -patient has a right quadriceps tendon rupture.  He is going to need surgical management for this.  Risks and benefits of surgery were discussed with the patient today.  Surgical versus nonsurgical management was discussed.  All preoperative and postoperative instructions were discussed with the patient in the office.  He is going to be an outpatient for a right quadriceps tendon repair.  Drue Novel, PA 09/26/2019, 8:15 AM

## 2019-09-28 ENCOUNTER — Encounter (HOSPITAL_COMMUNITY)
Admission: RE | Disposition: A | Discharge status: Home / Self Care | Payer: Self-pay | Source: Home / Self Care | Attending: Specialist

## 2019-09-28 ENCOUNTER — Ambulatory Visit (HOSPITAL_COMMUNITY): Payer: BC Managed Care – PPO | Admitting: Certified Registered"

## 2019-09-28 ENCOUNTER — Ambulatory Visit (HOSPITAL_COMMUNITY)
Admission: RE | Admit: 2019-09-28 | Discharge: 2019-09-28 | Disposition: A | Discharge status: Home / Self Care | Payer: BC Managed Care – PPO | Attending: Specialist | Admitting: Specialist

## 2019-09-28 ENCOUNTER — Other Ambulatory Visit: Payer: Self-pay

## 2019-09-28 ENCOUNTER — Encounter (HOSPITAL_COMMUNITY): Payer: Self-pay | Admitting: Specialist

## 2019-09-28 DIAGNOSIS — N401 Enlarged prostate with lower urinary tract symptoms: Secondary | ICD-10-CM | POA: Diagnosis not present

## 2019-09-28 DIAGNOSIS — I10 Essential (primary) hypertension: Secondary | ICD-10-CM | POA: Diagnosis not present

## 2019-09-28 DIAGNOSIS — Y93H9 Activity, other involving exterior property and land maintenance, building and construction: Secondary | ICD-10-CM | POA: Insufficient documentation

## 2019-09-28 DIAGNOSIS — W010XXA Fall on same level from slipping, tripping and stumbling without subsequent striking against object, initial encounter: Secondary | ICD-10-CM | POA: Insufficient documentation

## 2019-09-28 DIAGNOSIS — S76111A Strain of right quadriceps muscle, fascia and tendon, initial encounter: Secondary | ICD-10-CM | POA: Diagnosis present

## 2019-09-28 DIAGNOSIS — Z87891 Personal history of nicotine dependence: Secondary | ICD-10-CM | POA: Diagnosis not present

## 2019-09-28 DIAGNOSIS — R338 Other retention of urine: Secondary | ICD-10-CM | POA: Diagnosis not present

## 2019-09-28 DIAGNOSIS — Z85850 Personal history of malignant neoplasm of thyroid: Secondary | ICD-10-CM | POA: Insufficient documentation

## 2019-09-28 HISTORY — PX: QUADRICEPS TENDON REPAIR: SHX756

## 2019-09-28 HISTORY — DX: Essential (primary) hypertension: I10

## 2019-09-28 SURGERY — REPAIR, TENDON, QUADRICEPS
Anesthesia: Spinal | Site: Knee | Laterality: Right

## 2019-09-28 MED ORDER — ONDANSETRON HCL 4 MG PO TABS: 4.0000 mg | ORAL_TABLET | Freq: Three times a day (TID) | ORAL | 1 refills | Status: DC | PRN

## 2019-09-28 MED ORDER — MIDAZOLAM HCL 2 MG/2ML IJ SOLN
INTRAMUSCULAR | Status: AC
  Filled 2019-09-28: qty 2

## 2019-09-28 MED ORDER — LACTATED RINGERS IV SOLN: INTRAVENOUS | Status: DC

## 2019-09-28 MED ORDER — EPHEDRINE SULFATE-NACL 50-0.9 MG/10ML-% IV SOSY
PREFILLED_SYRINGE | INTRAVENOUS | Status: DC | PRN
  Administered 2019-09-28: 5 mg via INTRAVENOUS

## 2019-09-28 MED ORDER — FENTANYL CITRATE (PF) 100 MCG/2ML IJ SOLN
50.0000 ug | INTRAMUSCULAR | Status: DC
  Administered 2019-09-28: 50 ug via INTRAVENOUS
  Filled 2019-09-28: qty 2

## 2019-09-28 MED ORDER — ONDANSETRON HCL 4 MG/2ML IJ SOLN
INTRAMUSCULAR | Status: AC
  Filled 2019-09-28: qty 2

## 2019-09-28 MED ORDER — FENTANYL CITRATE (PF) 100 MCG/2ML IJ SOLN: 25.0000 ug | INTRAMUSCULAR | Status: DC | PRN

## 2019-09-28 MED ORDER — EPHEDRINE 5 MG/ML INJ
INTRAVENOUS | Status: AC
  Filled 2019-09-28: qty 10

## 2019-09-28 MED ORDER — ONDANSETRON HCL 4 MG/2ML IJ SOLN
INTRAMUSCULAR | Status: DC | PRN
  Administered 2019-09-28: 4 mg via INTRAVENOUS

## 2019-09-28 MED ORDER — BUPIVACAINE-EPINEPHRINE (PF) 0.25% -1:200000 IJ SOLN
INTRAMUSCULAR | Status: DC | PRN
  Administered 2019-09-28: 30 mL via PERINEURAL

## 2019-09-28 MED ORDER — BUPIVACAINE IN DEXTROSE 0.75-8.25 % IT SOLN
INTRATHECAL | Status: DC | PRN
  Administered 2019-09-28: 1.8 mL via INTRATHECAL

## 2019-09-28 MED ORDER — METHOCARBAMOL 500 MG PO TABS: 500.0000 mg | ORAL_TABLET | Freq: Four times a day (QID) | ORAL | 0 refills | Status: DC

## 2019-09-28 MED ORDER — MIDAZOLAM HCL 2 MG/2ML IJ SOLN
INTRAMUSCULAR | Status: DC | PRN
  Administered 2019-09-28 (×2): 1 mg via INTRAVENOUS

## 2019-09-28 MED ORDER — PROPOFOL 10 MG/ML IV BOLUS
INTRAVENOUS | Status: DC | PRN
  Administered 2019-09-28 (×4): 20 mg via INTRAVENOUS

## 2019-09-28 MED ORDER — PHENYLEPHRINE HCL-NACL 20-0.9 MG/250ML-% IV SOLN: INTRAVENOUS | Status: DC | PRN

## 2019-09-28 MED ORDER — CEFAZOLIN SODIUM-DEXTROSE 2-4 GM/100ML-% IV SOLN
2.0000 g | INTRAVENOUS | Status: AC
  Administered 2019-09-28: 2 g via INTRAVENOUS
  Filled 2019-09-28: qty 100

## 2019-09-28 MED ORDER — TAMSULOSIN HCL 0.4 MG PO CAPS: 0.4000 mg | ORAL_CAPSULE | Freq: Every day | ORAL | 11 refills | Status: DC

## 2019-09-28 MED ORDER — FENTANYL CITRATE (PF) 100 MCG/2ML IJ SOLN
INTRAMUSCULAR | Status: DC | PRN
  Administered 2019-09-28: 25 ug via INTRAVENOUS

## 2019-09-28 MED ORDER — 0.9 % SODIUM CHLORIDE (POUR BTL) OPTIME
TOPICAL | Status: DC | PRN
  Administered 2019-09-28: 1000 mL

## 2019-09-28 MED ORDER — PROPOFOL 500 MG/50ML IV EMUL
INTRAVENOUS | Status: DC | PRN
  Administered 2019-09-28: 75 ug/kg/min via INTRAVENOUS

## 2019-09-28 MED ORDER — POVIDONE-IODINE 10 % EX SWAB
2.0000 "application " | Freq: Once | CUTANEOUS | Status: AC
  Administered 2019-09-28: 2 via TOPICAL

## 2019-09-28 MED ORDER — ONDANSETRON HCL 4 MG/2ML IJ SOLN: 4.0000 mg | Freq: Once | INTRAMUSCULAR | Status: DC | PRN

## 2019-09-28 MED ORDER — PROPOFOL 1000 MG/100ML IV EMUL
INTRAVENOUS | Status: AC
  Filled 2019-09-28: qty 100

## 2019-09-28 MED ORDER — OXYCODONE HCL 5 MG PO TABS: 5.0000 mg | ORAL_TABLET | Freq: Once | ORAL | Status: DC | PRN

## 2019-09-28 MED ORDER — LIDOCAINE 2% (20 MG/ML) 5 ML SYRINGE
INTRAMUSCULAR | Status: AC
  Filled 2019-09-28: qty 5

## 2019-09-28 MED ORDER — OXYCODONE HCL 5 MG PO TABS: 5.0000 mg | ORAL_TABLET | ORAL | 0 refills | Status: AC | PRN

## 2019-09-28 MED ORDER — ROPIVACAINE HCL 7.5 MG/ML IJ SOLN
INTRAMUSCULAR | Status: DC | PRN
  Administered 2019-09-28: 20 mL via PERINEURAL

## 2019-09-28 MED ORDER — MIDAZOLAM HCL 2 MG/2ML IJ SOLN
1.0000 mg | INTRAMUSCULAR | Status: DC
  Administered 2019-09-28: 2 mg via INTRAVENOUS
  Filled 2019-09-28: qty 2

## 2019-09-28 MED ORDER — BUPIVACAINE-EPINEPHRINE (PF) 0.25% -1:200000 IJ SOLN
INTRAMUSCULAR | Status: AC
  Filled 2019-09-28: qty 30

## 2019-09-28 MED ORDER — PHENYLEPHRINE HCL-NACL 10-0.9 MG/250ML-% IV SOLN
INTRAVENOUS | Status: DC | PRN
Start: 2019-09-28 — End: 2019-09-28
  Administered 2019-09-28: 20 ug/min via INTRAVENOUS

## 2019-09-28 MED ORDER — CEPHALEXIN 500 MG PO CAPS: 500.0000 mg | ORAL_CAPSULE | Freq: Four times a day (QID) | ORAL | 0 refills | Status: AC

## 2019-09-28 MED ORDER — DEXAMETHASONE SODIUM PHOSPHATE 10 MG/ML IJ SOLN
INTRAMUSCULAR | Status: DC | PRN
  Administered 2019-09-28: 8 mg via INTRAVENOUS

## 2019-09-28 MED ORDER — CHLORHEXIDINE GLUCONATE 0.12 % MT SOLN
15.0000 mL | Freq: Once | OROMUCOSAL | Status: AC
  Administered 2019-09-28: 15 mL via OROMUCOSAL

## 2019-09-28 MED ORDER — DEXAMETHASONE SODIUM PHOSPHATE 10 MG/ML IJ SOLN
INTRAMUSCULAR | Status: AC
  Filled 2019-09-28: qty 1

## 2019-09-28 MED ORDER — OXYCODONE HCL 5 MG/5ML PO SOLN: 5.0000 mg | Freq: Once | ORAL | Status: DC | PRN

## 2019-09-28 MED ORDER — ORAL CARE MOUTH RINSE: 15.0000 mL | Freq: Once | OROMUCOSAL | Status: AC

## 2019-09-28 MED ORDER — FENTANYL CITRATE (PF) 100 MCG/2ML IJ SOLN
INTRAMUSCULAR | Status: AC
  Filled 2019-09-28: qty 2

## 2019-09-28 SURGICAL SUPPLY — 60 items
BAG ZIPLOCK 12X15 (MISCELLANEOUS) ×3 IMPLANT
BANDAGE ESMARK 6X9 LF (GAUZE/BANDAGES/DRESSINGS) ×1 IMPLANT
BNDG ELASTIC 4X5.8 VLCR STR LF (GAUZE/BANDAGES/DRESSINGS) ×6 IMPLANT
BNDG ELASTIC 6X5.8 VLCR STR LF (GAUZE/BANDAGES/DRESSINGS) ×3 IMPLANT
BNDG ESMARK 6X9 LF (GAUZE/BANDAGES/DRESSINGS) ×3
BNDG GAUZE ELAST 4 BULKY (GAUZE/BANDAGES/DRESSINGS) ×3 IMPLANT
CLOSURE WOUND 1/2 X4 (GAUZE/BANDAGES/DRESSINGS) ×1
COVER SURGICAL LIGHT HANDLE (MISCELLANEOUS) ×3 IMPLANT
COVER WAND RF STERILE (DRAPES) IMPLANT
DERMABOND ADVANCED (GAUZE/BANDAGES/DRESSINGS) ×2
DERMABOND ADVANCED .7 DNX12 (GAUZE/BANDAGES/DRESSINGS) ×1 IMPLANT
DRAPE POUCH INSTRU U-SHP 10X18 (DRAPES) ×3 IMPLANT
DRAPE SHEET LG 3/4 BI-LAMINATE (DRAPES) ×3 IMPLANT
DRAPE U-SHAPE 47X51 STRL (DRAPES) ×3 IMPLANT
DRSG AQUACEL AG ADV 3.5X10 (GAUZE/BANDAGES/DRESSINGS) ×3 IMPLANT
DRSG PAD ABDOMINAL 8X10 ST (GAUZE/BANDAGES/DRESSINGS) ×6 IMPLANT
ELECT REM PT RETURN 15FT ADLT (MISCELLANEOUS) ×3 IMPLANT
GLOVE BIOGEL PI IND STRL 7.5 (GLOVE) ×1 IMPLANT
GLOVE BIOGEL PI IND STRL 8 (GLOVE) ×1 IMPLANT
GLOVE BIOGEL PI INDICATOR 7.5 (GLOVE) ×2
GLOVE BIOGEL PI INDICATOR 8 (GLOVE) ×2
GLOVE ECLIPSE 8.0 STRL XLNG CF (GLOVE) ×3 IMPLANT
GLOVE SURG SS PI 7.0 STRL IVOR (GLOVE) ×3 IMPLANT
GOWN STRL REUS W/TWL XL LVL3 (GOWN DISPOSABLE) ×6 IMPLANT
IMMOBILIZER KNEE 20 (SOFTGOODS) ×3
IMMOBILIZER KNEE 20 THIGH 36 (SOFTGOODS) ×1 IMPLANT
KIT BASIN OR (CUSTOM PROCEDURE TRAY) ×3 IMPLANT
KIT TURNOVER KIT A (KITS) IMPLANT
NEEDLE MA TROC 1/2 CIR (NEEDLE) ×3 IMPLANT
NEEDLE SPNL 18GX3.5 QUINCKE PK (NEEDLE) ×3 IMPLANT
NS IRRIG 1000ML POUR BTL (IV SOLUTION) ×3 IMPLANT
PACK TOTAL JOINT (CUSTOM PROCEDURE TRAY) ×3 IMPLANT
PADDING CAST ABS 6INX4YD NS (CAST SUPPLIES) ×8
PADDING CAST ABS COTTON 6X4 NS (CAST SUPPLIES) ×4 IMPLANT
PASSER SUT SWANSON 36MM LOOP (INSTRUMENTS) ×3 IMPLANT
PENCIL SMOKE EVACUATOR (MISCELLANEOUS) IMPLANT
PROTECTOR NERVE ULNAR (MISCELLANEOUS) ×3 IMPLANT
SPONGE LAP 18X18 RF (DISPOSABLE) ×3 IMPLANT
STOCKINETTE 8 INCH (MISCELLANEOUS) ×3 IMPLANT
STRIP CLOSURE SKIN 1/2X4 (GAUZE/BANDAGES/DRESSINGS) ×2 IMPLANT
SUT FIBERWIRE #2 38 T-5 BLUE (SUTURE) ×6
SUT FIBERWIRE #5 38 BLUE (WIRE) ×6 IMPLANT
SUT MNCRL AB 4-0 PS2 18 (SUTURE) ×6 IMPLANT
SUT PDS AB 2-0 CT2 27 (SUTURE) ×6 IMPLANT
SUT VIC AB 0 CT1 27 (SUTURE) ×6
SUT VIC AB 0 CT1 27XBRD ANTBC (SUTURE) ×2 IMPLANT
SUT VIC AB 0 CT1 36 (SUTURE) ×3 IMPLANT
SUT VIC AB 2-0 CT1 27 (SUTURE) ×12
SUT VIC AB 2-0 CT1 TAPERPNT 27 (SUTURE) ×4 IMPLANT
SUTURE FIBERWR #2 38 T-5 BLUE (SUTURE) ×2 IMPLANT
SUTURE TAPE 1.3 40 TPR END (SUTURE) ×2 IMPLANT
SUTURETAPE 1.3 40 TPR END (SUTURE) ×6
SYR 30ML LL (SYRINGE) ×3 IMPLANT
SYS INTERNAL BRACE KNEE (Miscellaneous) ×3 IMPLANT
SYSTEM INTERNAL BRACE KNEE (Miscellaneous) ×1 IMPLANT
TAPE STRIPS DRAPE STRL (GAUZE/BANDAGES/DRESSINGS) ×3 IMPLANT
TOWEL OR 17X26 10 PK STRL BLUE (TOWEL DISPOSABLE) ×6 IMPLANT
TOWEL OR NON WOVEN STRL DISP B (DISPOSABLE) ×3 IMPLANT
TRAY CATH 16FR W/PLASTIC CATH (SET/KITS/TRAYS/PACK) ×3 IMPLANT
WATER STERILE IRR 1000ML POUR (IV SOLUTION) ×3 IMPLANT

## 2019-09-28 NOTE — Transfer of Care (Signed)
Immediate Anesthesia Transfer of Care Note  Patient: Jerry Freeman  Procedure(s) Performed: REPAIR QUADRICEP TENDON (Right Knee)  Patient Location: PACU  Anesthesia Type:Spinal  Level of Consciousness: drowsy and patient cooperative  Airway & Oxygen Therapy: Patient Spontanous Breathing and Patient connected to face mask oxygen  Post-op Assessment: Report given to RN and Post -op Vital signs reviewed and stable  Post vital signs: Reviewed and stable  Last Vitals:  Vitals Value Taken Time  BP 122/90 09/28/19 1300  Temp    Pulse 63 09/28/19 1301  Resp 12 09/28/19 1301  SpO2 100 % 09/28/19 1301  Vitals shown include unvalidated device data.  Last Pain:  Vitals:   09/28/19 0910  TempSrc:   PainSc: 0-No pain      Patients Stated Pain Goal: 4 (74/45/14 6047)  Complications: No complications documented.

## 2019-09-28 NOTE — Anesthesia Preprocedure Evaluation (Addendum)
Anesthesia Evaluation  Patient identified by MRN, date of birth, ID band Patient awake    Reviewed: Allergy & Precautions, NPO status , Patient's Chart, lab work & pertinent test results  History of Anesthesia Complications Negative for: history of anesthetic complications  Airway Mallampati: I  TM Distance: >3 FB Neck ROM: Full    Dental  (+) Dental Advisory Given, Teeth Intact   Pulmonary former smoker,    Pulmonary exam normal        Cardiovascular hypertension, Pt. on medications Normal cardiovascular exam     Neuro/Psych negative neurological ROS  negative psych ROS   GI/Hepatic negative GI ROS, Neg liver ROS,   Endo/Other  Hypothyroidism   Renal/GU negative Renal ROS     Musculoskeletal  (+) Arthritis ,   Abdominal   Peds  Hematology  Plt 176k    Anesthesia Other Findings Covid test negative   Reproductive/Obstetrics                            Anesthesia Physical Anesthesia Plan  ASA: II  Anesthesia Plan: Spinal   Post-op Pain Management:  Regional for Post-op pain   Induction:   PONV Risk Score and Plan: 1 and Treatment may vary due to age or medical condition and Propofol infusion  Airway Management Planned: Natural Airway and Simple Face Mask  Additional Equipment: None  Intra-op Plan:   Post-operative Plan:   Informed Consent: I have reviewed the patients History and Physical, chart, labs and discussed the procedure including the risks, benefits and alternatives for the proposed anesthesia with the patient or authorized representative who has indicated his/her understanding and acceptance.       Plan Discussed with: CRNA and Anesthesiologist  Anesthesia Plan Comments: (Labs reviewed, platelets acceptable. Discussed risks and benefits of spinal, including spinal/epidural hematoma, infection, failed block, and PDPH. Patient expressed understanding and wished  to proceed. )       Anesthesia Quick Evaluation

## 2019-09-28 NOTE — Op Note (Signed)
Preop diagnosis right quadriceps tendon rupture Postop diagnosis same Procedure primary repair of right quadriceps tendon rupture Surgeon Hart Robinsons, MD Assistant Jason Coop, PA-C Anesthesia preoperative abductor block with spinal Estimated blood loss minimal Drains none Tourniquet time 50 minutes Complications none Disposition PACU stable   Operative details patient counseling holding area correct side marked and signed appropriately chart was reviewed and signed.  Preoperative block was administered per anesthesiologist taken to the operating room spinal anesthetic was administered.  IV antibiotics were given within 1 hour of the surgical incision time.  Right lower extremity elevated prepped with DuraPrep and draped into a sterile fashion.  Timeout done confirm the right side.  Exsanguinated with an Esmarch tourniquet inflated to 325 mmHg.  Straight midline incision was made just proximal to the patella to the midportion through the skin subcu medial open the fascia observed serosanguineous fluid removed from the rupture quadriceps tendon was a complete rupture with a tear in the medial lateral retinaculum and capsule it was mobilized proximal and distal soft tissue was removed from the patella down to bleeding bone with scraping and curette.  Sharp edges were mobilized proximally to healthy tissue.  2 limbs of locking suture of Arthrex fiber tape were placed into the tendon.  Gap pins were placed into the patella overreamed with the reamer tapped sutures were placed into the swivel locks and then tapped and screwed in position.  This approximated the edges nicely without tension and full extension labral quadriceps were then repaired medial and lateral into the retinaculum and capsule with running Arthrex labral fiber tape.  This was done with appropriate tension and full extension.  ACL was irrigated to closure with saline.  The overlying retinaculum closed with Vicryl subcu Vicryl skin with a  subcuticular Monocryl suture Dermabond was applied and a sterile dressing.  Sterile dressing was applied over top of this tourniquet was deflated is in place and the plaster splints in full extension there are no complications or problems.  Sponge and needle count were correct on 2 counts.  He was awakened taken from the operating room to the PACU in stable condition.  To help with patient positioning prepping draping technical surgical assistance throughout the entire case Ms. Almedia Balls, PA-C assistance was needed.

## 2019-09-28 NOTE — Anesthesia Procedure Notes (Signed)
Spinal  Patient location during procedure: OR Start time: 09/28/2019 10:59 AM End time: 09/28/2019 11:05 AM Staffing Performed: anesthesiologist  Anesthesiologist: Audry Pili, MD Preanesthetic Checklist Completed: patient identified, IV checked, risks and benefits discussed, surgical consent, monitors and equipment checked, pre-op evaluation and timeout performed Spinal Block Patient position: sitting Prep: DuraPrep Patient monitoring: heart rate, cardiac monitor, continuous pulse ox and blood pressure Approach: midline Location: L3-4 Injection technique: single-shot Needle Needle type: Pencan  Needle gauge: 24 G Additional Notes Consent was obtained prior to the procedure with all questions answered and concerns addressed. Risks including, but not limited to, bleeding, infection, nerve damage, paralysis, failed block, inadequate analgesia, allergic reaction, high spinal, itching, and headache were discussed and the patient wished to proceed. Functioning IV was confirmed and monitors were applied. Sterile prep and drape, including hand hygiene, mask, and sterile gloves were used. The patient was positioned and the spine was prepped. The skin was anesthetized with lidocaine. After unsuccessful attempt by CRNA, Dr. Fransisco Beau successful on first attempt. Free flow of clear CSF was obtained prior to injecting local anesthetic into the CSF. The spinal needle aspirated freely following injection. The needle was carefully withdrawn. The patient tolerated the procedure well.   Jerry Don, MD

## 2019-09-28 NOTE — Progress Notes (Signed)
Assisted Dr. Brock with right, ultrasound guided, adductor canal block. Side rails up, monitors on throughout procedure. See vital signs in flow sheet. Tolerated Procedure well.  

## 2019-09-28 NOTE — Progress Notes (Signed)
Pt unable to void; bladder scan performed with 774 ml noted to be present.  Called Dr. Theda Sers and updated him.  Dr. Theda Sers to contact urology for a consult.  Pt to remain in PACU at this time.

## 2019-09-28 NOTE — Anesthesia Procedure Notes (Signed)
Anesthesia Regional Block: Adductor canal block   Pre-Anesthetic Checklist: ,, timeout performed, Correct Patient, Correct Site, Correct Laterality, Correct Procedure, Correct Position, site marked, Risks and benefits discussed,  Surgical consent,  Pre-op evaluation,  At surgeon's request and post-op pain management  Laterality: Right  Prep: chloraprep       Needles:  Injection technique: Single-shot  Needle Type: Echogenic Needle     Needle Length: 10cm  Needle Gauge: 21     Additional Needles:   Narrative:  Start time: 09/28/2019 9:07 AM End time: 09/28/2019 9:10 AM Injection made incrementally with aspirations every 5 mL.  Performed by: Personally  Anesthesiologist: Audry Pili, MD  Additional Notes: No pain on injection. No increased resistance to injection. Injection made in 5cc increments. Good needle visualization. Patient tolerated the procedure well.

## 2019-09-28 NOTE — Discharge Instructions (Signed)
Quadriceps tendon repair:  Rx have been sent to the pharmacy on file- [facility] Please start Aspirin 325 mg tabs twice a day for 6 weeks Stay in splint until follow up appointment -- DO NOT BEND KNEE Do not get splint wet Okay to do ankle pumps Please call the office with any concerns

## 2019-09-28 NOTE — Interval H&P Note (Signed)
History and Physical Interval Note:  09/28/2019 10:11 AM  Jerry Freeman  has presented today for surgery, with the diagnosis of Right quad tendon rupture.  The various methods of treatment have been discussed with the patient and family. After consideration of risks, benefits and other options for treatment, the patient has consented to  Procedure(s): REPAIR QUADRICEP TENDON (Right) as a surgical intervention.  The patient's history has been reviewed, patient examined, no change in status, stable for surgery.  I have reviewed the patient's chart and labs.  Questions were answered to the patient's satisfaction.     [name] ANDREW

## 2019-09-28 NOTE — Consult Note (Signed)
Urology Consult  Referring physician: Dr. Theda Sers Reason for referral: gross hematuria, difficult foley placement  Chief Complaint: suprapubic pain  History of Present Illness: Jerry Freeman is a 64yo with a hx of quadraceps tendon rupture s/p repair today by Dr. Theda Sers. Patient was unable to urinate after the spinal anesthesia and CIC was performed which yielded grossly bloody urine. The patient continued to experience suprapubic pain and bladder scan showed over 700cc. The patient states he has baseline weak stream, nocturia 1-2x, urinary frequency, and urinary hesitancy. He takes saw palmetto for his LUTS. No prior hx of gross hematuria. No hx of UTIs or prostate infections.   Past Medical History:  Diagnosis Date  . Arthritis   . Cancer ([place])    thyroid   . Hypertension   . Hypothyroidism   . Seasonal allergies    Past Surgical History:  Procedure Laterality Date  . HERNIA REPAIR     13 yrs ago  . rupture quad tenden    . THYROIDECTOMY     4 or 5 yrs ago    Medications: I have reviewed the patient's current medications. Allergies: No Known Allergies  Family History  Problem Relation Age of Onset  . Cancer Mother        lung  . Cancer Father        rectal  . Kidney disease Maternal Grandmother        dialysis  . Hypertension Maternal Grandmother   . Hyperlipidemia Maternal Grandmother   . Arthritis Maternal Grandfather   . High Cholesterol Brother   . Hypertension Brother   . Stroke Brother    Social History:  reports that he quit smoking about 27 years ago. His smoking use included cigarettes. He quit after 18.00 years of use. He has never used smokeless tobacco. He reports previous alcohol use. He reports previous drug use.  Review of Systems  Gastrointestinal: Positive for abdominal pain.  Genitourinary: Positive for difficulty urinating, frequency and hematuria.  All other systems reviewed and are negative.   Physical Exam:  Vital signs in last 24 hours: Temp:   [95.6 F (35.3 C)-97.8 F (36.6 C)] 97.5 F (36.4 C) (07/16 1545) Pulse Rate:  [47-97] 97 (07/16 1545) Resp:  [10-18] 16 (07/16 1545) BP: (109-143)/(75-97) 109/75 (07/16 1545) SpO2:  [97 %-100 %] 97 % (07/16 1545) Weight:  [100.4 kg] 100.4 kg (07/16 0813) Physical Exam Vitals reviewed.  Constitutional:      Appearance: Normal appearance.  HENT:     Head: Normocephalic and atraumatic.  Eyes:     Extraocular Movements: Extraocular movements intact.     Pupils: Pupils are equal, round, and reactive to light.  Cardiovascular:     Rate and Rhythm: Normal rate and regular rhythm.  Pulmonary:     Effort: Pulmonary effort is normal. No respiratory distress.  Abdominal:     General: Abdomen is flat. There is no distension.  Genitourinary:    Penis: Normal.      Testes: Normal.  Musculoskeletal:     Cervical back: Normal range of motion and neck supple.     Left upper leg: Normal.     Comments: Right leg in splint/cast  Skin:    General: Skin is warm and dry.  Neurological:     General: No focal deficit present.     Mental Status: He is alert and oriented to person, place, and time.  Psychiatric:        Mood and Affect: Mood normal.  Behavior: Behavior normal.        Thought Content: Thought content normal.        Judgment: Judgment normal.     Laboratory Data:  No results found for this or any previous visit (from the past 72 hour(s)). Recent Results (from the past 240 hour(s))  SARS CORONAVIRUS 2 (TAT 6-24 HRS) Nasopharyngeal Nasopharyngeal Swab     Status: None   Collection Time: 09/25/19  8:33 AM   Specimen: Nasopharyngeal Swab  Result Value Ref Range Status   SARS Coronavirus 2 NEGATIVE NEGATIVE Final    Comment: (NOTE) SARS-CoV-2 target nucleic acids are NOT DETECTED.  The SARS-CoV-2 RNA is generally detectable in upper and lower respiratory specimens during the acute phase of infection. Negative results do not preclude SARS-CoV-2 infection, do not rule  out co-infections with other pathogens, and should not be used as the sole basis for treatment or other patient management decisions. Negative results must be combined with clinical observations, patient history, and epidemiological information. The expected result is Negative.  Fact Sheet for Patients: SugarRoll.be  Fact Sheet for Healthcare Providers: https://www.woods-mathews.com/  This test is not yet approved or cleared by the [place] FDA and  has been authorized for detection and/or diagnosis of SARS-CoV-2 by FDA under an Emergency Use Authorization (EUA). This EUA will remain  in effect (meaning this test can be used) for the duration of the COVID-19 declaration under Se ction 564(b)(1) of the Act, 21 U.S.C. section 360bbb-3(b)(1), unless the authorization is terminated or revoked sooner.  Performed at [facility], [place] [address], [place], [place] [address]    Creatinine: Recent Labs    09/25/19 0854  CREATININE 1.04   Baseline Creatinine: 1  Impression/Assessment:  64yo with BPH and urinary retention  Plan:  18 french coude catheter was placed without incident and 800cc of clear urine promptly drained upon foley placement. The patient will be started on flomax 0.4mg  daily and he will followup at [facility] in 5-7 days for a voiding trial.  Nicolette Bang 09/28/2019, 5:31 PM

## 2019-09-28 NOTE — Interval H&P Note (Signed)
History and Physical Interval Note:  09/28/2019 10:10 AM  Jerry Freeman  has presented today for surgery, with the diagnosis of Right quad tendon rupture.  The various methods of treatment have been discussed with the patient and family. After consideration of risks, benefits and other options for treatment, the patient has consented to  Procedure(s): REPAIR QUADRICEP TENDON (Right) as a surgical intervention.  The patient's history has been reviewed, patient examined, no change in status, stable for surgery.  I have reviewed the patient's chart and labs.  Questions were answered to the patient's satisfaction.     Rexann Lueras ANDREW

## 2019-09-28 NOTE — Anesthesia Procedure Notes (Signed)
Procedure Name: MAC Date/Time: 09/28/2019 10:58 AM Performed by: Eben Burow, CRNA Pre-anesthesia Checklist: Patient identified, Emergency Drugs available, Suction available, Patient being monitored and Timeout performed Oxygen Delivery Method: Simple face mask Placement Confirmation: positive ETCO2

## 2019-09-28 NOTE — OR Nursing (Signed)
In and out cath attempted by this RN. No urine, bright red blood noted. Catheter removed. MD Theda Sers notified who instructed that PACU monitor for urine output and contact if more blood/no urine occurs. Will report this to PACU.

## 2019-09-28 NOTE — Anesthesia Postprocedure Evaluation (Signed)
Anesthesia Post Note  Patient: Eliu Batch  Procedure(s) Performed: REPAIR QUADRICEP TENDON (Right Knee)     Patient location during evaluation: PACU Anesthesia Type: Spinal Level of consciousness: awake and alert Pain management: pain level controlled Vital Signs Assessment: post-procedure vital signs reviewed and stable Respiratory status: spontaneous breathing and respiratory function stable Cardiovascular status: blood pressure returned to baseline and stable Postop Assessment: spinal receding and no apparent nausea or vomiting Anesthetic complications: no   No complications documented.  Last Vitals:  Vitals:   09/28/19 1400 09/28/19 1415  BP: 122/81 131/87  Pulse: (!) 47 (!) 50  Resp: 10 11  Temp: (!) 35.8 C (!) 36.1 C  SpO2: 98% 100%    Last Pain:  Vitals:   09/28/19 1400  TempSrc:   PainSc: 0-No pain                 Audry Pili

## 2019-10-01 ENCOUNTER — Encounter (HOSPITAL_COMMUNITY): Payer: Self-pay | Admitting: Specialist

## 2019-10-01 ENCOUNTER — Telehealth: Payer: Self-pay | Admitting: Urology

## 2019-10-01 NOTE — Telephone Encounter (Signed)
Pt wife left Vm stating she was told to call our office to schedule and appt with Dr. Alyson Ingles, this Wednesday. She would like a call back concerning the pt cath.

## 2019-10-01 NOTE — Telephone Encounter (Signed)
Returned call, all questions addressed and appt made.

## 2019-10-02 NOTE — Telephone Encounter (Signed)
Pts wife called saying pt  Has cath and red tinged urine was in bag. He had this when they first put cath in after procedure and had not since. She said he was not in pain. Pt. Scheduled for a voiding trial in morning.  Just advised to have him drink water to see if clears up. Call back if needed.

## 2019-10-03 ENCOUNTER — Other Ambulatory Visit: Payer: Self-pay

## 2019-10-03 ENCOUNTER — Ambulatory Visit (INDEPENDENT_AMBULATORY_CARE_PROVIDER_SITE_OTHER): Payer: BC Managed Care – PPO | Admitting: Urology

## 2019-10-03 DIAGNOSIS — R339 Retention of urine, unspecified: Secondary | ICD-10-CM | POA: Diagnosis not present

## 2019-10-03 NOTE — Progress Notes (Signed)
Fill and Pull Catheter Removal  Patient is present today for a catheter removal.  Patient was cleaned and prepped in a sterile fashion 265ml of sterile water/ saline was instilled into the bladder when the patient felt the urge to urinate. 78ml of water was then drained from the balloon.  A 16FR foley cath was removed from the bladder no complications were noted .  Patient as then given some time to void on their own.  Patient can void  24ml on their own after some time.  Patient tolerated well.  Performed by: Alexiz Sustaita, LPN  Follow up/ Additional notes: 1 month OV w/PVR

## 2019-10-05 DIAGNOSIS — R339 Retention of urine, unspecified: Secondary | ICD-10-CM | POA: Insufficient documentation

## 2019-11-14 ENCOUNTER — Encounter: Payer: Self-pay | Admitting: Urology

## 2019-11-14 ENCOUNTER — Other Ambulatory Visit: Payer: Self-pay

## 2019-11-14 ENCOUNTER — Ambulatory Visit (INDEPENDENT_AMBULATORY_CARE_PROVIDER_SITE_OTHER): Payer: BC Managed Care – PPO | Admitting: Urology

## 2019-11-14 VITALS — BP 127/81 | HR 62 | Temp 97.6°F | Ht 73.0 in | Wt 221.4 lb

## 2019-11-14 DIAGNOSIS — N401 Enlarged prostate with lower urinary tract symptoms: Secondary | ICD-10-CM | POA: Diagnosis not present

## 2019-11-14 DIAGNOSIS — R339 Retention of urine, unspecified: Secondary | ICD-10-CM | POA: Diagnosis not present

## 2019-11-14 DIAGNOSIS — N138 Other obstructive and reflux uropathy: Secondary | ICD-10-CM | POA: Insufficient documentation

## 2019-11-14 LAB — URINALYSIS, ROUTINE W REFLEX MICROSCOPIC
Bilirubin, UA: NEGATIVE
Glucose, UA: NEGATIVE
Ketones, UA: NEGATIVE
Leukocytes,UA: NEGATIVE
Nitrite, UA: NEGATIVE
Protein,UA: NEGATIVE
RBC, UA: NEGATIVE
Specific Gravity, UA: >1.03 — ABNORMAL HIGH (ref 1.005–1.030)
Urobilinogen, Ur: 0.2 mg/dL (ref 0.2–1.0)
pH, UA: 5 (ref 5.0–7.5)

## 2019-11-14 LAB — BLADDER SCAN AMB NON-IMAGING: Scan Result: 10.7

## 2019-11-14 MED ORDER — TAMSULOSIN HCL 0.4 MG PO CAPS: 0.4000 mg | ORAL_CAPSULE | Freq: Every day | ORAL | 3 refills | Status: DC

## 2019-11-14 NOTE — Patient Instructions (Signed)

## 2019-11-14 NOTE — Progress Notes (Signed)
Urological Symptom Review  Patient is experiencing the following symptoms: Frequent urination Hard to postpone urination Stream starts and stops   Review of Systems  Gastrointestinal (upper)  : Negative for upper GI symptoms  Gastrointestinal (lower) : Negative for lower GI symptoms  Constitutional : Negative for symptoms  Skin: Itching  Eyes: Negative for eye symptoms  Ear/Nose/Throat : Negative for Ear/Nose/Throat symptoms  Hematologic/Lymphatic: Negative for Hematologic/Lymphatic symptoms  Cardiovascular : Negative for cardiovascular symptoms  Respiratory : Negative for respiratory symptoms  Endocrine: Negative for endocrine symptoms  Musculoskeletal: Negative for musculoskeletal symptoms  Neurological: Negative for neurological symptoms  Psychologic: Negative for psychiatric symptoms

## 2019-11-14 NOTE — Progress Notes (Signed)
11/14/2019 9:40 AM   Thurmon Fair Apr 11, 1955 [id number]  Referring provider: Horald Pollen, MD [place],  [place] [address]  Weak stream  HPI: Mr Remedios is [phone] here for followup for BPH and urinary retention. He has rare nocturia based on fluid limitation prior to bedtime. He stopped his flomax after his voiding trial. He has urinary urgency and a feeling of incomplete emptying. Stream is fair.   PMH: Past Medical History:  Diagnosis Date  . Arthritis   . Cancer ([place])    thyroid   . Hypertension   . Hypothyroidism   . Seasonal allergies     Surgical History: Past Surgical History:  Procedure Laterality Date  . HERNIA REPAIR     13 yrs ago  . QUADRICEPS TENDON REPAIR Right 09/28/2019   Procedure: REPAIR QUADRICEP TENDON;  Surgeon: Sydnee Cabal, MD;  Location: [facility];  Service: Orthopedics;  Laterality: Right;  . rupture quad tenden    . THYROIDECTOMY     4 or 5 yrs ago    Home Medications:  Allergies as of 11/14/2019   No Known Allergies     Medication List       Accurate as of November 14, 2019  9:40 AM. If you have any questions, ask your nurse or doctor.        amLODipine 5 MG tablet Commonly known as: NORVASC TAKE 1 TABLET DAILY What changed:   how much to take  how to take this  when to take this  additional instructions   cetirizine 10 MG tablet Commonly known as: ZYRTEC Take 1 tablet (10 mg total) by mouth daily.   CITRACAL +D3 PO Take 1 tablet by mouth daily.   Krill Oil 350 MG Caps Take 350 mg by mouth daily.   levothyroxine 150 MCG tablet Commonly known as: SYNTHROID Take 1 tablet (150 mcg total) by mouth daily.   MEGA MULTI MEN PO Take 1 packet by mouth daily. GNC 50 PLUS   meloxicam 7.5 MG tablet Commonly known as: MOBIC Take 1 tablet (7.5 mg total) by mouth daily. What changed:   when to take this  reasons to take this   methocarbamol 500 MG tablet Commonly known as: Robaxin Take 1 tablet (500  mg total) by mouth 4 (four) times daily.   ondansetron 4 MG tablet Commonly known as: Zofran Take 1 tablet (4 mg total) by mouth every 8 (eight) hours as needed for nausea or vomiting.   Pataday 0.1 % ophthalmic solution Generic drug: olopatadine Place 1 drop into both eyes 2 (two) times daily as needed (irritated/dry eyes.).   sildenafil 100 MG tablet Commonly known as: Viagra TAKE 1 TABLET DAILY AS NEEDED FOR ERECTILE DYSFUNCTION AT LEAST 1 HOUR PRIOR TO SEX.   tamsulosin 0.4 MG Caps capsule Commonly known as: Flomax Take 1 capsule (0.4 mg total) by mouth daily after supper.   vitamin C 1000 MG tablet Take 1,000 mg by mouth daily.       Allergies: No Known Allergies  Family History: Family History  Problem Relation Age of Onset  . Cancer Mother        lung  . Cancer Father        rectal  . Kidney disease Maternal Grandmother        dialysis  . Hypertension Maternal Grandmother   . Hyperlipidemia Maternal Grandmother   . Arthritis Maternal Grandfather   . High Cholesterol Brother   . Hypertension Brother   .  Stroke Brother     Social History:  reports that he quit smoking about 27 years ago. His smoking use included cigarettes. He quit after 18.00 years of use. He has never used smokeless tobacco. He reports previous alcohol use. He reports previous drug use.  ROS: All other review of systems were reviewed and are negative except what is noted above in HPI  Physical Exam: BP 127/81   Pulse 62   Temp 97.6 F (36.4 C)   Ht 6\' 1"  (1.854 m)   Wt 221 lb 6.4 oz (100.4 kg)   BMI 29.21 kg/m   Constitutional:  Alert and oriented, No acute distress. HEENT: [place] AT, moist mucus membranes.  Trachea midline, no masses. Cardiovascular: No clubbing, cyanosis, or edema. Respiratory: Normal respiratory effort, no increased work of breathing. GI: Abdomen is soft, nontender, nondistended, no abdominal masses GU: No CVA tenderness.  Lymph: No cervical or inguinal  lymphadenopathy. Skin: No rashes, bruises or suspicious lesions. Neurologic: Grossly intact, no focal deficits, moving all 4 extremities. Psychiatric: Normal mood and affect.  Laboratory Data: Lab Results  Component Value Date   WBC 4.1 09/25/2019   HGB 13.7 09/25/2019   HCT 42.3 09/25/2019   MCV 83.4 09/25/2019   PLT 176 09/25/2019    Lab Results  Component Value Date   CREATININE 1.04 09/25/2019    Lab Results  Component Value Date   PSA 0.66 04/03/2015   PSA 0.56 03/13/2013   PSA 0.51 12/08/2011    No results found for: TESTOSTERONE  Lab Results  Component Value Date   HGBA1C 5.4 12/18/2018    Urinalysis    Component Value Date/Time   APPEARANCEUR Clear 11/14/2019 0850   GLUCOSEU Negative 11/14/2019 0850   BILIRUBINUR Negative 11/14/2019 0850   PROTEINUR Negative 11/14/2019 0850   UROBILINOGEN 0.2 03/13/2014 1131   NITRITE Negative 11/14/2019 0850   LEUKOCYTESUR Negative 11/14/2019 0850    Lab Results  Component Value Date   LABMICR Comment 11/14/2019   MUCUS positive 03/13/2013   BACTERIA 1+ 03/13/2013    Pertinent Imaging:  No results found for this or any previous visit.  No results found for this or any previous visit.  No results found for this or any previous visit.  No results found for this or any previous visit.  No results found for this or any previous visit.  No results found for this or any previous visit.  No results found for this or any previous visit.  No results found for this or any previous visit.   Assessment & Plan:    1. BPH with LUTS, Urinary retention -restart flomax 0.4mg  daily -RTC 6 months   - Urinalysis, Routine w reflex microscopic - BLADDER SCAN AMB NON-IMAGING   No follow-ups on file.  Nicolette Bang, MD  [facility] [place]

## 2019-12-20 ENCOUNTER — Other Ambulatory Visit: Payer: Self-pay

## 2019-12-20 ENCOUNTER — Other Ambulatory Visit (HOSPITAL_COMMUNITY): Payer: Self-pay | Admitting: Physician Assistant

## 2019-12-20 ENCOUNTER — Ambulatory Visit (HOSPITAL_COMMUNITY)
Admission: RE | Admit: 2019-12-20 | Discharge: 2019-12-20 | Disposition: A | Discharge status: Home / Self Care | Payer: BC Managed Care – PPO | Source: Ambulatory Visit | Attending: Cardiology | Admitting: Cardiology

## 2019-12-20 ENCOUNTER — Encounter: Payer: BC Managed Care – PPO | Admitting: Family Medicine

## 2019-12-20 ENCOUNTER — Other Ambulatory Visit (HOSPITAL_COMMUNITY): Payer: Self-pay | Admitting: Specialist

## 2019-12-20 DIAGNOSIS — M79604 Pain in right leg: Secondary | ICD-10-CM | POA: Diagnosis not present

## 2020-01-03 ENCOUNTER — Other Ambulatory Visit: Payer: Self-pay

## 2020-01-03 ENCOUNTER — Other Ambulatory Visit: Payer: Self-pay | Admitting: Emergency Medicine

## 2020-01-03 ENCOUNTER — Ambulatory Visit (INDEPENDENT_AMBULATORY_CARE_PROVIDER_SITE_OTHER): Payer: BC Managed Care – PPO | Admitting: Family Medicine

## 2020-01-03 ENCOUNTER — Encounter: Payer: Self-pay | Admitting: Family Medicine

## 2020-01-03 VITALS — BP 150/82 | HR 67 | Temp 98.1°F | Ht 73.0 in | Wt 227.0 lb

## 2020-01-03 DIAGNOSIS — Z23 Encounter for immunization: Secondary | ICD-10-CM | POA: Diagnosis not present

## 2020-01-03 DIAGNOSIS — Z0001 Encounter for general adult medical examination with abnormal findings: Secondary | ICD-10-CM

## 2020-01-03 DIAGNOSIS — Z0000 Encounter for general adult medical examination without abnormal findings: Secondary | ICD-10-CM

## 2020-01-03 DIAGNOSIS — M5136 Other intervertebral disc degeneration, lumbar region: Secondary | ICD-10-CM

## 2020-01-03 DIAGNOSIS — I10 Essential (primary) hypertension: Secondary | ICD-10-CM | POA: Diagnosis not present

## 2020-01-03 DIAGNOSIS — Z889 Allergy status to unspecified drugs, medicaments and biological substances status: Secondary | ICD-10-CM

## 2020-01-03 DIAGNOSIS — N529 Male erectile dysfunction, unspecified: Secondary | ICD-10-CM | POA: Diagnosis not present

## 2020-01-03 DIAGNOSIS — E890 Postprocedural hypothyroidism: Secondary | ICD-10-CM

## 2020-01-03 MED ORDER — CETIRIZINE HCL 10 MG PO TABS: 10.0000 mg | ORAL_TABLET | Freq: Every day | ORAL | 3 refills | Status: DC

## 2020-01-03 MED ORDER — MELOXICAM 7.5 MG PO TABS: 7.5000 mg | ORAL_TABLET | Freq: Every day | ORAL | 5 refills | Status: DC | PRN

## 2020-01-03 MED ORDER — SILDENAFIL CITRATE 100 MG PO TABS: ORAL_TABLET | ORAL | 5 refills | Status: DC

## 2020-01-03 MED ORDER — LEVOTHYROXINE SODIUM 150 MCG PO TABS: 150.0000 ug | ORAL_TABLET | Freq: Every day | ORAL | 3 refills | Status: DC

## 2020-01-03 MED ORDER — AMLODIPINE BESYLATE 5 MG PO TABS: ORAL_TABLET | ORAL | 3 refills | Status: DC

## 2020-01-03 NOTE — Telephone Encounter (Signed)
Requested medication (s) are due for refill today - yes  Requested medication (s) are on the active medication list -yes  Future visit scheduled -today  Last refill: 11/15/19  Notes to clinic: Visit today at office- expired rx sent for review of request  Requested Prescriptions  Pending Prescriptions Disp Refills   meloxicam (MOBIC) 7.5 MG tablet [Pharmacy Med Name: MELOXICAM TABS 7.5MG ] 30 tablet 11    Sig: TAKE 1 TABLET DAILY      Analgesics:  COX2 Inhibitors Passed - 01/03/2020 10:52 AM      Passed - HGB in normal range and within 360 days    Hemoglobin  Date Value Ref Range Status  09/25/2019 13.7 13.0 - 17.0 g/dL Final  12/18/2018 13.6 13.0 - 17.7 g/dL Final          Passed - Cr in normal range and within 360 days    Creat  Date Value Ref Range Status  12/23/2015 1.14 0.70 - 1.25 mg/dL Final    Comment:      For patients > or = 65 years of age: The upper reference limit for Creatinine is approximately 13% higher for people identified as African-American.      Creatinine, Ser  Date Value Ref Range Status  09/25/2019 1.04 0.61 - 1.24 mg/dL Final          Passed - Patient is not pregnant      Passed - Valid encounter within last 12 months    Recent Outpatient Visits           Today Annual physical exam   Primary Care at [facility], Fenton Malling, MD   1 year ago Routine general medical examination at a health care facility   Primary Care at [facility], Ines Bloomer, MD   1 year ago Essential hypertension   Primary Care at [place], Ines Bloomer, MD   2 years ago DDD (degenerative disc disease), lumbar   Primary Care at [facility], Audrie Lia, PA-C   2 years ago Essential hypertension   Primary Care at [facility], [place], [place]       Future Appointments             In 4 months McKenzie, Candee Furbish, MD [place]                Requested Prescriptions  Pending Prescriptions Disp Refills   meloxicam (MOBIC) 7.5 MG  tablet [Pharmacy Med Name: MELOXICAM TABS 7.5MG ] 30 tablet 11    Sig: TAKE 1 TABLET DAILY      Analgesics:  COX2 Inhibitors Passed - 01/03/2020 10:52 AM      Passed - HGB in normal range and within 360 days    Hemoglobin  Date Value Ref Range Status  09/25/2019 13.7 13.0 - 17.0 g/dL Final  12/18/2018 13.6 13.0 - 17.7 g/dL Final          Passed - Cr in normal range and within 360 days    Creat  Date Value Ref Range Status  12/23/2015 1.14 0.70 - 1.25 mg/dL Final    Comment:      For patients > or = 66 years of age: The upper reference limit for Creatinine is approximately 13% higher for people identified as African-American.      Creatinine, Ser  Date Value Ref Range Status  09/25/2019 1.04 0.61 - 1.24 mg/dL Final          Passed - Patient is not pregnant  Passed - Valid encounter within last 12 months    Recent Outpatient Visits           Today Annual physical exam   Primary Care at [facility], Fenton Malling, MD   1 year ago Routine general medical examination at a health care facility   Primary Care at [facility], Ines Bloomer, MD   1 year ago Essential hypertension   Primary Care at [facility], Ines Bloomer, MD   2 years ago DDD (degenerative disc disease), lumbar   Primary Care at [facility], Audrie Lia, PA-C   2 years ago Essential hypertension   Primary Care at [facility], [place], [place]       Future Appointments             In 4 months McKenzie, Candee Furbish, MD [place]

## 2020-01-03 NOTE — Patient Instructions (Addendum)
  Continue current medications.  I went ahead and refilled your thyroid, but do not get it until you get the results of your thyroid blood test to make sure that you are on the correct dose.  Continue getting regular exercise.  Really annually or as needed.   If you have lab work done today you will be contacted with your lab results within the next 2 weeks.  If you have not heard from [place] then please contact us. The fastest way to get your results is to register for My Chart.   IF you received an x-ray today, you will receive an invoice from [facility]. Please contact [facility] at [phone] with questions or concerns regarding your invoice.   IF you received labwork today, you will receive an invoice from [facility]. Please contact [facility] at [phone] with questions or concerns regarding your invoice.   Our billing staff will not be able to assist you with questions regarding bills from these companies.  You will be contacted with the lab results as soon as they are available. The fastest way to get your results is to activate your My Chart account. Instructions are located on the last page of this paperwork. If you have not heard from [place] regarding the results in 2 weeks, please contact this office.

## 2020-01-03 NOTE — Progress Notes (Signed)
Patient ID: Jerry Freeman, male    DOB: June 22, 1955  Age: 64 y.o. MRN: [id number]  No chief complaint on file.   Subjective:   Patient is here for his annual physical examination.  No major acute complaints.  He generally does well.  He did have problems with his knees.  Some years ago he had a left knee quadriceps injury and this year he had a right knee but otherwise he has no major problems.  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 He is married.  Lives in [place].  Likes to play golf.  Family history: Father died of rectal cancer.  Mother died of lung cancer.  He has 2 children living well.  Review of systems: Constitutional: Unremarkable HEENT: Sees his eye doctor regularly.  No other problems Cardiovascular: Unremarkable Respiratory: Unremarkable GI: Unremarkable GU: Unremarkable.  No longer has any nocturia since has been on the medication.  Does still use Viagra. Musculoskeletal: Knee problems, otherwise unremarkable. Dermatologic: Unremarkable Psychiatric: Unremarkable Endocrine: Unremarkable Neurologic: Unremarkable     Current allergies, medications, problem list, past/family and social histories reviewed.  Objective:  BP (!) 150/82 (BP Location: Right Arm, Patient Position: Sitting, Cuff Size: Normal)   Pulse 67   Temp 98.1 F (36.7 C) (Oral)   Ht _0  (1.854 m)   Wt 227 lb (103 kg)   SpO2 98%   BMI 29.95 kg/m   Developed well-nourished man in no acute distress.  Alert and oriented.  Wears a mask but he has had his Covid shots.  TMs normal.  Eyes PERRL.  Throat clear and teeth look fair.  Neck supple without nodes or thyromegaly.  No carotid bruits.  Chest clear to auscultation.  Heart rate without murmurs.  No axillary or inguinal nodes.  Normal male external genitalia  with testes descended.  Circumcised.  No hernias.  Extremities unremarkable.  Skin unremarkable.  Assessment & Plan:   Assessment: 1. Annual physical exam   2. Need for prophylactic vaccination and inoculation against influenza   3. Need for immunization against influenza   4. Essential hypertension   5. Erectile dysfunction, unspecified erectile dysfunction type   6. History of allergy   7. Postoperative hypothyroidism       Plan: Instructions.  Orders Placed This Encounter  Procedures  . Flu Vaccine QUAD High Dose(Fluad)  . CBC with Differential  . CMP14+EGFR  . TSH  . Lipid panel  . PSA    Meds ordered this encounter  Medications  . amLODipine (NORVASC) 5 MG tablet    Sig: TAKE 1 TABLET DAILY    Dispense:  90 tablet    Refill:  3  . cetirizine (ZYRTEC) 10 MG tablet    Sig: Take 1 tablet (10 mg total) by mouth daily.    Dispense:  90 tablet    Refill:  3  . levothyroxine (SYNTHROID) 150 MCG tablet    Sig: Take 1 tablet (150 mcg total) by mouth daily.    Dispense:  90 tablet    Refill:  3  . sildenafil (VIAGRA) 100 MG tablet    Sig: TAKE 1 TABLET DAILY AS NEEDED FOR ERECTILE DYSFUNCTION AT LEAST 1 HOUR PRIOR TO SEX.    Dispense:  30 tablet    Refill:  5         Patient Instructions  Continue current medications.  I went ahead and refilled your thyroid, but do not get it until you get the results of your thyroid blood test to make sure that you are on the correct dose.  Continue getting regular exercise.  Really annually or as needed.   If you have lab work done today you will be contacted with your lab results within the next 2 weeks.  If you have not heard from [place] then please contact us. The fastest way to get your results is to register for My Chart.   IF you received an x-ray today, you will receive an invoice from [facility]. Please contact [facility] at [phone] with questions or concerns regarding your invoice.   IF  you received labwork today, you will receive an invoice from [place]. Please contact [facility] at [phone] with questions or concerns regarding your invoice.   Our billing staff will not be able to assist you with questions regarding bills from these companies.  You will be contacted with the lab results as soon as they are available. The fastest way to get your results is to activate your My Chart account. Instructions are located on the last page of this paperwork. If you have not heard from [place] regarding the results in 2 weeks, please contact this office.        Return in about 1 year (around 01/02/2021).   Ruben Reason, MD 01/03/2020

## 2020-01-03 NOTE — Addendum Note (Signed)
Addended by: Mirenda Baltazar H on: 01/03/2020 05:58 PM   Modules accepted: Orders

## 2020-01-04 LAB — CBC WITH DIFFERENTIAL/PLATELET
Basophils Absolute: 0 10*3/uL (ref 0.0–0.2)
Basos: 1 %
EOS (ABSOLUTE): 0.4 10*3/uL (ref 0.0–0.4)
Eos: 8 %
Hematocrit: 39.1 % (ref 37.5–51.0)
Hemoglobin: 13.1 g/dL (ref 13.0–17.7)
Immature Grans (Abs): 0 10*3/uL (ref 0.0–0.1)
Immature Granulocytes: 0 %
Lymphocytes Absolute: 1.7 10*3/uL (ref 0.7–3.1)
Lymphs: 36 %
MCH: 26.8 pg (ref 26.6–33.0)
MCHC: 33.5 g/dL (ref 31.5–35.7)
MCV: 80 fL (ref 79–97)
Monocytes Absolute: 0.5 10*3/uL (ref 0.1–0.9)
Monocytes: 10 %
Neutrophils Absolute: 2.1 10*3/uL (ref 1.4–7.0)
Neutrophils: 45 %
Platelets: 220 10*3/uL (ref 150–450)
RBC: 4.88 x10E6/uL (ref 4.14–5.80)
RDW: 14.7 % (ref 11.6–15.4)
WBC: 4.7 10*3/uL (ref 3.4–10.8)

## 2020-01-04 LAB — CMP14+EGFR
ALT: 28 IU/L (ref 0–44)
AST: 31 IU/L (ref 0–40)
Albumin/Globulin Ratio: 1.7 (ref 1.2–2.2)
Albumin: 4.3 g/dL (ref 3.8–4.8)
Alkaline Phosphatase: 69 IU/L (ref 44–121)
BUN/Creatinine Ratio: 12 (ref 10–24)
BUN: 15 mg/dL (ref 8–27)
Bilirubin Total: 0.6 mg/dL (ref 0.0–1.2)
CO2: 23 mmol/L (ref 20–29)
Calcium: 8.9 mg/dL (ref 8.6–10.2)
Chloride: 103 mmol/L (ref 96–106)
Creatinine, Ser: 1.3 mg/dL — ABNORMAL HIGH (ref 0.76–1.27)
GFR calc Af Amer: 66 mL/min/{1.73_m2} (ref >59)
GFR calc non Af Amer: 57 mL/min/{1.73_m2} — ABNORMAL LOW (ref >59)
Globulin, Total: 2.5 g/dL (ref 1.5–4.5)
Glucose: 88 mg/dL (ref 65–99)
Potassium: 4.6 mmol/L (ref 3.5–5.2)
Sodium: 141 mmol/L (ref 134–144)
Total Protein: 6.8 g/dL (ref 6.0–8.5)

## 2020-01-04 LAB — LIPID PANEL
Chol/HDL Ratio: 2.6 ratio (ref 0.0–5.0)
Cholesterol, Total: 202 mg/dL — ABNORMAL HIGH (ref 100–199)
HDL: 77 mg/dL (ref >39)
LDL Chol Calc (NIH): 111 mg/dL — ABNORMAL HIGH (ref 0–99)
Triglycerides: 78 mg/dL (ref 0–149)
VLDL Cholesterol Cal: 14 mg/dL (ref 5–40)

## 2020-01-04 LAB — PSA: Prostate Specific Ag, Serum: 1.4 ng/mL (ref 0.0–4.0)

## 2020-01-04 LAB — TSH: TSH: 9.93 u[IU]/mL — ABNORMAL HIGH (ref 0.450–4.500)

## 2020-01-04 MED ORDER — LEVOTHYROXINE SODIUM 175 MCG PO TABS: 175.0000 ug | ORAL_TABLET | Freq: Every day | ORAL | 3 refills | Status: DC

## 2020-01-04 NOTE — Addendum Note (Signed)
Addended by: Tayshon Winker H on: 01/04/2020 01:33 PM   Modules accepted: Orders

## 2020-01-08 ENCOUNTER — Encounter: Payer: Self-pay | Admitting: Radiology

## 2020-02-14 ENCOUNTER — Encounter: Payer: BC Managed Care – PPO | Admitting: Emergency Medicine

## 2020-05-15 ENCOUNTER — Ambulatory Visit: Payer: BC Managed Care – PPO | Admitting: Urology

## 2020-05-16 ENCOUNTER — Ambulatory Visit: Payer: BC Managed Care – PPO | Admitting: Urology

## 2020-07-23 ENCOUNTER — Other Ambulatory Visit: Payer: Self-pay

## 2020-07-24 ENCOUNTER — Encounter: Payer: Self-pay | Admitting: Emergency Medicine

## 2020-07-24 ENCOUNTER — Ambulatory Visit (INDEPENDENT_AMBULATORY_CARE_PROVIDER_SITE_OTHER): Payer: BC Managed Care – PPO | Admitting: Emergency Medicine

## 2020-07-24 ENCOUNTER — Other Ambulatory Visit: Payer: Self-pay

## 2020-07-24 VITALS — BP 124/68 | HR 96 | Temp 98.5°F | Ht 73.0 in | Wt 217.0 lb

## 2020-07-24 DIAGNOSIS — Z9109 Other allergy status, other than to drugs and biological substances: Secondary | ICD-10-CM | POA: Insufficient documentation

## 2020-07-24 DIAGNOSIS — R053 Chronic cough: Secondary | ICD-10-CM | POA: Diagnosis not present

## 2020-07-24 DIAGNOSIS — N529 Male erectile dysfunction, unspecified: Secondary | ICD-10-CM

## 2020-07-24 DIAGNOSIS — C73 Malignant neoplasm of thyroid gland: Secondary | ICD-10-CM

## 2020-07-24 DIAGNOSIS — N401 Enlarged prostate with lower urinary tract symptoms: Secondary | ICD-10-CM

## 2020-07-24 DIAGNOSIS — E890 Postprocedural hypothyroidism: Secondary | ICD-10-CM

## 2020-07-24 DIAGNOSIS — M5136 Other intervertebral disc degeneration, lumbar region: Secondary | ICD-10-CM

## 2020-07-24 DIAGNOSIS — I10 Essential (primary) hypertension: Secondary | ICD-10-CM | POA: Insufficient documentation

## 2020-07-24 DIAGNOSIS — N138 Other obstructive and reflux uropathy: Secondary | ICD-10-CM

## 2020-07-24 LAB — COMPREHENSIVE METABOLIC PANEL
ALT: 28 U/L (ref 0–53)
AST: 36 U/L (ref 0–37)
Albumin: 4.3 g/dL (ref 3.5–5.2)
Alkaline Phosphatase: 59 U/L (ref 39–117)
BUN: 18 mg/dL (ref 6–23)
CO2: 28 mEq/L (ref 19–32)
Calcium: 9.4 mg/dL (ref 8.4–10.5)
Chloride: 103 mEq/L (ref 96–112)
Creatinine, Ser: 1.25 mg/dL (ref 0.40–1.50)
GFR: 59.74 mL/min — ABNORMAL LOW (ref >60.00)
Glucose, Bld: 78 mg/dL (ref 70–99)
Potassium: 4.2 mEq/L (ref 3.5–5.1)
Sodium: 138 mEq/L (ref 135–145)
Total Bilirubin: 1.1 mg/dL (ref 0.2–1.2)
Total Protein: 7.1 g/dL (ref 6.0–8.3)

## 2020-07-24 LAB — CBC WITH DIFFERENTIAL/PLATELET
Basophils Absolute: 0 10*3/uL (ref 0.0–0.1)
Basophils Relative: 0.8 % (ref 0.0–3.0)
Eosinophils Absolute: 0.3 10*3/uL (ref 0.0–0.7)
Eosinophils Relative: 6.8 % — ABNORMAL HIGH (ref 0.0–5.0)
HCT: 39.8 % (ref 39.0–52.0)
Hemoglobin: 13.4 g/dL (ref 13.0–17.0)
Lymphocytes Relative: 33.8 % (ref 12.0–46.0)
Lymphs Abs: 1.6 10*3/uL (ref 0.7–4.0)
MCHC: 33.5 g/dL (ref 30.0–36.0)
MCV: 82.2 fl (ref 78.0–100.0)
Monocytes Absolute: 0.4 10*3/uL (ref 0.1–1.0)
Monocytes Relative: 9 % (ref 3.0–12.0)
Neutro Abs: 2.4 10*3/uL (ref 1.4–7.7)
Neutrophils Relative %: 49.6 % (ref 43.0–77.0)
Platelets: 157 10*3/uL (ref 150.0–400.0)
RBC: 4.85 Mil/uL (ref 4.22–5.81)
RDW: 15.8 % — ABNORMAL HIGH (ref 11.5–15.5)
WBC: 4.8 10*3/uL (ref 4.0–10.5)

## 2020-07-24 LAB — HEMOGLOBIN A1C: Hgb A1c MFr Bld: 5.5 % (ref 4.6–6.5)

## 2020-07-24 LAB — TSH: TSH: 0.83 u[IU]/mL (ref 0.35–4.50)

## 2020-07-24 MED ORDER — SILDENAFIL CITRATE 100 MG PO TABS: ORAL_TABLET | ORAL | 5 refills | Status: DC

## 2020-07-24 MED ORDER — MELOXICAM 7.5 MG PO TABS: 7.5000 mg | ORAL_TABLET | Freq: Every day | ORAL | 5 refills | Status: DC | PRN

## 2020-07-24 NOTE — Assessment & Plan Note (Signed)
Not responding to over-the-counter medications.  Will refer to allergist.

## 2020-07-24 NOTE — Assessment & Plan Note (Signed)
Well-controlled hypertension. Continue amlodipine 5 mg daily. 

## 2020-07-24 NOTE — Progress Notes (Signed)
Jerry Freeman 65 y.o.   Chief Complaint  Patient presents with  . Medication Refill    Pt would like a refill on zyrtec and to discuss something different for allergies. Pt also would like a refill synthroid, meloxicam, and viagra.    HISTORY OF PRESENT ILLNESS: This is a 65 y.o. male complaining of chronic dry cough for years.  Non-smoker.  Negative work-up in the past.  Negative response to PPIs for treatment of suspected GERD.  No history of asthma. Has the following chronic medical problems: 1.  Hypertension: On amlodipine 5 mg daily.  Doing well. 2.  Chronic allergies: Takes cetirizine 10 mg as needed.  Symptoms not responding very well to over-the-counter medications.  Needs referral to allergist. 3.  Arthritis: Takes Mobic 7.5 mg as needed. 4.  History of thyroid surgery due to suspected cancer, has postsurgical hypothyroidism, takes Synthroid 150 mcg daily.  Doing well. 5.  ED: On sildenafil 100 mg as needed. No other complaints or medical concerns today. Physically active with good nutrition.  HPI   Prior to Admission medications   Medication Sig Start Date End Date Taking? Authorizing Provider  amLODipine (NORVASC) 5 MG tablet TAKE 1 TABLET DAILY 01/03/20  Yes Posey Boyer, MD  Ascorbic Acid (VITAMIN C) 1000 MG tablet Take 1,000 mg by mouth daily.   Yes [provider]  Calcium-Phosphorus-Vitamin D (CITRACAL +D3 PO) Take 1 tablet by mouth daily.   Yes [provider]  cetirizine (ZYRTEC) 10 MG tablet Take 1 tablet (10 mg total) by mouth daily. 01/03/20  Yes Posey Boyer, MD  Krill Oil 350 MG CAPS Take 350 mg by mouth daily.   Yes [provider]  levothyroxine (SYNTHROID) 175 MCG tablet Take 1 tablet (175 mcg total) by mouth daily. 01/04/20  Yes Posey Boyer, MD  meloxicam (MOBIC) 7.5 MG tablet Take 1 tablet (7.5 mg total) by mouth daily as needed (pain.). 01/03/20  Yes Posey Boyer, MD  Multiple Vitamins-Minerals (MEGA MULTI MEN PO)  Take 1 packet by mouth daily. [employer] 50 PLUS    Yes [provider]  olopatadine (PATANOL) 0.1 % ophthalmic solution Place 1 drop into both eyes 2 (two) times daily as needed (irritated/dry eyes.).   Yes [provider]  sildenafil (VIAGRA) 100 MG tablet TAKE 1 TABLET DAILY AS NEEDED FOR ERECTILE DYSFUNCTION AT LEAST 1 HOUR PRIOR TO SEX. 01/03/20  Yes Posey Boyer, MD  tamsulosin (FLOMAX) 0.4 MG CAPS capsule Take 1 capsule (0.4 mg total) by mouth daily after supper. 11/14/19  Yes McKenzie, Candee Furbish, MD  methocarbamol (ROBAXIN) 500 MG tablet Take 1 tablet (500 mg total) by mouth 4 (four) times daily. Patient not taking: No sig reported 09/28/19   Elizabeth Sauer R, PA  ondansetron (ZOFRAN) 4 MG tablet Take 1 tablet (4 mg total) by mouth every 8 (eight) hours as needed for nausea or vomiting. Patient not taking: No sig reported 09/28/19 09/27/20  Drue Novel, PA    No Known Allergies  Patient Active Problem List   Diagnosis Date Noted  . Benign prostatic hyperplasia with urinary obstruction 11/14/2019  . Urinary retention 10/05/2019  . DDD (degenerative disc disease), lumbar 11/20/2013  . Family history of colon cancer 12/11/2011  . Cancer of thyroid ([place]) 12/11/2011  . Hypothyroidism 08/16/2011  . ED (erectile dysfunction) 08/16/2011    Past Medical History:  Diagnosis Date  . Arthritis   . Cancer ([place])    thyroid   . Hypertension   .  Hypothyroidism   . Seasonal allergies     Past Surgical History:  Procedure Laterality Date  . HERNIA REPAIR     13 yrs ago  . QUADRICEPS TENDON REPAIR Right 09/28/2019   Procedure: REPAIR QUADRICEP TENDON;  Surgeon: Sydnee Cabal, MD;  Location: [facility];  Service: Orthopedics;  Laterality: Right;  . rupture quad tenden    . THYROIDECTOMY     4 or 5 yrs ago    Social History   Socioeconomic History  . Marital status: Married    Spouse name: Not on file  . Number of children: 2  . Years of education: 1  . Highest  education level: Not on file  Occupational History  . Occupation: Press photographer  Tobacco Use  . Smoking status: Former Smoker    Years: 18.00    Types: Cigarettes    Quit date: 12/15/1991    Years since quitting: 28.6  . Smokeless tobacco: Never Used  . Tobacco comment: also smoked marijuana,and cocaine (free-base)   Vaping Use  . Vaping Use: Never used  Substance and Sexual Activity  . Alcohol use: Not Currently    Alcohol/week: 0.0 standard drinks    Comment: hx of 30 years ago   . Drug use: Not Currently    Comment: lst 30 years ago   . Sexual activity: Not on file  Other Topics Concern  . Not on file  Social History Narrative   Exercise cardio and weights 4 days/wk for 1 hour   Social Determinants of Health   Financial Resource Strain: Not on file  Food Insecurity: Not on file  Transportation Needs: Not on file  Physical Activity: Not on file  Stress: Not on file  Social Connections: Not on file  Intimate Partner Violence: Not on file    Family History  Problem Relation Age of Onset  . Cancer Mother        lung  . Cancer Father        rectal  . Kidney disease Maternal Grandmother        dialysis  . Hypertension Maternal Grandmother   . Hyperlipidemia Maternal Grandmother   . Arthritis Maternal Grandfather   . High Cholesterol Brother   . Hypertension Brother   . Stroke Brother      Review of Systems  Constitutional: Negative.  Negative for chills and fever.  HENT: Negative.  Negative for congestion and sore throat.   Respiratory: Positive for cough. Negative for hemoptysis, sputum production, shortness of breath and wheezing.   Cardiovascular: Negative.  Negative for chest pain and palpitations.  Gastrointestinal: Negative.  Negative for abdominal pain, diarrhea, nausea and vomiting.  Genitourinary: Negative.  Negative for dysuria and hematuria.  Skin: Negative.  Negative for rash.  Neurological: Negative.  Negative for dizziness and headaches.  All  other systems reviewed and are negative.   Today's Vitals   07/24/20 1426  BP: 124/68  Pulse: 96  Temp: 98.5 F (36.9 C)  TempSrc: Oral  SpO2: 97%  Weight: 217 lb (98.4 kg)  Height: '6\' 1"'  (1.854 m)   Body mass index is 28.63 kg/m.  Physical Exam Vitals reviewed.  Constitutional:      Appearance: Normal appearance.  HENT:     Head: Normocephalic.     Right Ear: Tympanic membrane, ear canal and external ear normal.     Left Ear: Tympanic membrane, ear canal and external ear normal.  Eyes:     Extraocular Movements: Extraocular movements intact.  Conjunctiva/sclera: Conjunctivae normal.     Pupils: Pupils are equal, round, and reactive to light.  Cardiovascular:     Rate and Rhythm: Normal rate and regular rhythm.     Pulses: Normal pulses.     Heart sounds: Normal heart sounds.  Pulmonary:     Effort: Pulmonary effort is normal.     Breath sounds: Normal breath sounds.  Abdominal:     Palpations: Abdomen is soft.     Tenderness: There is no abdominal tenderness.  Musculoskeletal:        General: Normal range of motion.     Cervical back: Normal range of motion and neck supple.     Right lower leg: No edema.     Left lower leg: No edema.  Skin:    General: Skin is warm and dry.     Capillary Refill: Capillary refill takes less than 2 seconds.  Neurological:     General: No focal deficit present.     Mental Status: He is alert and oriented to person, place, and time.  Psychiatric:        Mood and Affect: Mood normal.        Behavior: Behavior normal.      ASSESSMENT & PLAN: A total of 30 minutes was spent with the patient and counseling/coordination of care regarding multiple chronic medical problems and treatment, cardiovascular risks associated with hypertension, review of all medications, review of most recent office visit notes, review of most recent blood work results, differential diagnosis of cough, need for allergy evaluation, health maintenance items,  education on nutrition, prognosis, documentation, and need for follow-up.  Essential hypertension Well-controlled hypertension.  Continue amlodipine 5 mg daily.  Hypothyroidism Will check TSH today.  May need to decrease dose of Synthroid.  Presently taking 175 mcg daily.  Could be borderline hyperthyroid.  Benign prostatic hyperplasia with urinary obstruction Stable.  Continue Flomax 0.4 mg daily.  Chronic cough Chronic cough for years.  Benign presentation.  No red flag signs or symptoms.  Not responsive to antireflux measures.  Could be allergic in nature.  Will refer to allergist. Benign examination with no wheezing or abnormal breath sounds.  No history of asthma or COPD.  Non-smoker.  No hemoptysis.  No fever or chills weight loss or night sweats.  ED (erectile dysfunction) Viagra working well.  Will refill medication.  Environmental allergies Not responding to over-the-counter medications.  Will refer to allergist.   Patient Instructions   Health Maintenance After Age 77 After age 82, you are at a higher risk for certain long-term diseases and infections as well as injuries from falls. Falls are a major cause of broken bones and head injuries in people who are older than age 64. Getting regular preventive care can help to keep you healthy and well. Preventive care includes getting regular testing and making lifestyle changes as recommended by your health care provider. Talk with your health care provider about:  Which screenings and tests you should have. A screening is a test that checks for a disease when you have no symptoms.  A diet and exercise plan that is right for you. What should I know about screenings and tests to prevent falls? Screening and testing are the best ways to find a health problem early. Early diagnosis and treatment give you the best chance of managing medical conditions that are common after age 42. Certain conditions and lifestyle choices may make you  more likely to have a fall. Your health care  provider may recommend:  Regular vision checks. Poor vision and conditions such as cataracts can make you more likely to have a fall. If you wear glasses, make sure to get your prescription updated if your vision changes.  Medicine review. Work with your health care provider to regularly review all of the medicines you are taking, including over-the-counter medicines. Ask your health care provider about any side effects that may make you more likely to have a fall. Tell your health care provider if any medicines that you take make you feel dizzy or sleepy.  Osteoporosis screening. Osteoporosis is a condition that causes the bones to get weaker. This can make the bones weak and cause them to break more easily.  Blood pressure screening. Blood pressure changes and medicines to control blood pressure can make you feel dizzy.  Strength and balance checks. Your health care provider may recommend certain tests to check your strength and balance while standing, walking, or changing positions.  Foot health exam. Foot pain and numbness, as well as not wearing proper footwear, can make you more likely to have a fall.  Depression screening. You may be more likely to have a fall if you have a fear of falling, feel emotionally low, or feel unable to do activities that you used to do.  Alcohol use screening. Using too much alcohol can affect your balance and may make you more likely to have a fall. What actions can I take to lower my risk of falls? General instructions  Talk with your health care provider about your risks for falling. Tell your health care provider if: ? You fall. Be sure to tell your health care provider about all falls, even ones that seem minor. ? You feel dizzy, sleepy, or off-balance.  Take over-the-counter and prescription medicines only as told by your health care provider. These include any supplements.  Eat a healthy diet and maintain  a healthy weight. A healthy diet includes low-fat dairy products, low-fat (lean) meats, and fiber from whole grains, beans, and lots of fruits and vegetables. Home safety  Remove any tripping hazards, such as rugs, cords, and clutter.  Install safety equipment such as grab bars in bathrooms and safety rails on stairs.  Keep rooms and walkways well-lit. Activity  Follow a regular exercise program to stay fit. This will help you maintain your balance. Ask your health care provider what types of exercise are appropriate for you.  If you need a cane or walker, use it as recommended by your health care provider.  Wear supportive shoes that have nonskid soles.   Lifestyle  Do not drink alcohol if your health care provider tells you not to drink.  If you drink alcohol, limit how much you have: ? 0-1 drink a day for women. ? 0-2 drinks a day for men.  Be aware of how much alcohol is in your drink. In the [place] one drink equals one typical bottle of beer (12 oz), one-half glass of wine (5 oz), or one shot of hard liquor (1 oz).  Do not use any products that contain nicotine or tobacco, such as cigarettes and e-cigarettes. If you need help quitting, ask your health care provider. Summary  Having a healthy lifestyle and getting preventive care can help to protect your health and wellness after age 39.  Screening and testing are the best way to find a health problem early and help you avoid having a fall. Early diagnosis and treatment give you the best chance for  managing medical conditions that are more common for people who are older than age 5.  Falls are a major cause of broken bones and head injuries in people who are older than age 30. Take precautions to prevent a fall at home.  Work with your health care provider to learn what changes you can make to improve your health and wellness and to prevent falls. This information is not intended to replace advice given to you by your health  care provider. Make sure you discuss any questions you have with your health care provider. Document Revised: 06/22/2018 Document Reviewed: 01/12/2017 Elsevier Patient Education  2021 [place], MD [facility]

## 2020-07-24 NOTE — Assessment & Plan Note (Signed)
Chronic cough for years.  Benign presentation.  No red flag signs or symptoms.  Not responsive to antireflux measures.  Could be allergic in nature.  Will refer to allergist. Benign examination with no wheezing or abnormal breath sounds.  No history of asthma or COPD.  Non-smoker.  No hemoptysis.  No fever or chills weight loss or night sweats.

## 2020-07-24 NOTE — Assessment & Plan Note (Signed)
Viagra working well.  Will refill medication.

## 2020-07-24 NOTE — Patient Instructions (Signed)
Health Maintenance After Age 67 After age 67, you are at a higher risk for certain long-term diseases and infections as well as injuries from falls. Falls are a major cause of broken bones and head injuries in people who are older than age 67. Getting regular preventive care can help to keep you healthy and well. Preventive care includes getting regular testing and making lifestyle changes as recommended by your health care provider. Talk with your health care provider about:  Which screenings and tests you should have. A screening is a test that checks for a disease when you have no symptoms.  A diet and exercise plan that is right for you. What should I know about screenings and tests to prevent falls? Screening and testing are the best ways to find a health problem early. Early diagnosis and treatment give you the best chance of managing medical conditions that are common after age 67. Certain conditions and lifestyle choices may make you more likely to have a fall. Your health care provider may recommend:  Regular vision checks. Poor vision and conditions such as cataracts can make you more likely to have a fall. If you wear glasses, make sure to get your prescription updated if your vision changes.  Medicine review. Work with your health care provider to regularly review all of the medicines you are taking, including over-the-counter medicines. Ask your health care provider about any side effects that may make you more likely to have a fall. Tell your health care provider if any medicines that you take make you feel dizzy or sleepy.  Osteoporosis screening. Osteoporosis is a condition that causes the bones to get weaker. This can make the bones weak and cause them to break more easily.  Blood pressure screening. Blood pressure changes and medicines to control blood pressure can make you feel dizzy.  Strength and balance checks. Your health care provider may recommend certain tests to check your  strength and balance while standing, walking, or changing positions.  Foot health exam. Foot pain and numbness, as well as not wearing proper footwear, can make you more likely to have a fall.  Depression screening. You may be more likely to have a fall if you have a fear of falling, feel emotionally low, or feel unable to do activities that you used to do.  Alcohol use screening. Using too much alcohol can affect your balance and may make you more likely to have a fall. What actions can I take to lower my risk of falls? General instructions  Talk with your health care provider about your risks for falling. Tell your health care provider if: ? You fall. Be sure to tell your health care provider about all falls, even ones that seem minor. ? You feel dizzy, sleepy, or off-balance.  Take over-the-counter and prescription medicines only as told by your health care provider. These include any supplements.  Eat a healthy diet and maintain a healthy weight. A healthy diet includes low-fat dairy products, low-fat (lean) meats, and fiber from whole grains, beans, and lots of fruits and vegetables. Home safety  Remove any tripping hazards, such as rugs, cords, and clutter.  Install safety equipment such as grab bars in bathrooms and safety rails on stairs.  Keep rooms and walkways well-lit. Activity  Follow a regular exercise program to stay fit. This will help you maintain your balance. Ask your health care provider what types of exercise are appropriate for you.  If you need a cane or walker,   use it as recommended by your health care provider.  Wear supportive shoes that have nonskid soles.   Lifestyle  Do not drink alcohol if your health care provider tells you not to drink.  If you drink alcohol, limit how much you have: ? 0-1 drink a day for women. ? 0-2 drinks a day for men.  Be aware of how much alcohol is in your drink. In the [place] one drink equals one typical bottle of beer (12  oz), one-half glass of wine (5 oz), or one shot of hard liquor (1 oz).  Do not use any products that contain nicotine or tobacco, such as cigarettes and e-cigarettes. If you need help quitting, ask your health care provider. Summary  Having a healthy lifestyle and getting preventive care can help to protect your health and wellness after age 67.  Screening and testing are the best way to find a health problem early and help you avoid having a fall. Early diagnosis and treatment give you the best chance for managing medical conditions that are more common for people who are older than age 67.  Falls are a major cause of broken bones and head injuries in people who are older than age 67. Take precautions to prevent a fall at home.  Work with your health care provider to learn what changes you can make to improve your health and wellness and to prevent falls. This information is not intended to replace advice given to you by your health care provider. Make sure you discuss any questions you have with your health care provider. Document Revised: 06/22/2018 Document Reviewed: 01/12/2017 Elsevier Patient Education  2021 [employer]  

## 2020-07-24 NOTE — Assessment & Plan Note (Signed)
Will check TSH today.  May need to decrease dose of Synthroid.  Presently taking 175 mcg daily.  Could be borderline hyperthyroid.

## 2020-07-24 NOTE — Assessment & Plan Note (Signed)
Stable.  Continue Flomax 0.4 mg daily.

## 2020-07-25 ENCOUNTER — Telehealth: Payer: Self-pay | Admitting: Emergency Medicine

## 2020-07-25 ENCOUNTER — Other Ambulatory Visit: Payer: Self-pay | Admitting: Emergency Medicine

## 2020-07-25 ENCOUNTER — Other Ambulatory Visit: Payer: Self-pay

## 2020-07-25 MED ORDER — LEVOTHYROXINE SODIUM 125 MCG PO TABS
125.0000 ug | ORAL_TABLET | Freq: Every day | ORAL | 3 refills | Status: DC
Start: 2020-07-25 — End: 2020-07-25

## 2020-07-25 MED ORDER — LEVOTHYROXINE SODIUM 125 MCG PO TABS: 125.0000 ug | ORAL_TABLET | Freq: Every day | ORAL | 3 refills | Status: DC

## 2020-07-25 NOTE — Telephone Encounter (Signed)
   Patient calling for lab results. He would like to know if the levothyroxine (SYNTHROID) 175 MCG tablet dosage will remain the same   Please call

## 2020-07-25 NOTE — Telephone Encounter (Signed)
Thanks

## 2020-07-25 NOTE — Telephone Encounter (Signed)
Pt requesting that [facility]othyoxine 125 mcg [facility]. I reordered medication to [facility] and called to cancel the prescription to [facility].

## 2020-07-25 NOTE — Telephone Encounter (Signed)
Called and spoke with pt about lab results and sending his prescription for [facility].

## 2020-09-18 ENCOUNTER — Encounter: Payer: Self-pay | Admitting: Allergy

## 2020-09-18 ENCOUNTER — Ambulatory Visit (INDEPENDENT_AMBULATORY_CARE_PROVIDER_SITE_OTHER): Payer: Medicare HMO | Admitting: Allergy

## 2020-09-18 ENCOUNTER — Other Ambulatory Visit: Payer: Self-pay

## 2020-09-18 VITALS — BP 136/80 | HR 66 | Temp 97.8°F | Resp 18 | Ht 72.64 in | Wt 212.0 lb

## 2020-09-18 DIAGNOSIS — J3089 Other allergic rhinitis: Secondary | ICD-10-CM

## 2020-09-18 DIAGNOSIS — R053 Chronic cough: Secondary | ICD-10-CM

## 2020-09-18 MED ORDER — FLUTICASONE PROPIONATE 50 MCG/ACT NA SUSP
1.0000 | Freq: Two times a day (BID) | NASAL | 5 refills | Status: DC | PRN
Start: 2020-09-18 — End: 2020-12-04

## 2020-09-18 MED ORDER — AZELASTINE-FLUTICASONE 137-50 MCG/ACT NA SUSP
1.0000 | Freq: Two times a day (BID) | NASAL | 3 refills | Status: DC
Start: 2020-09-18 — End: 2020-12-04

## 2020-09-18 MED ORDER — AZELASTINE HCL 0.1 % NA SOLN: 1.0000 | Freq: Two times a day (BID) | NASAL | 5 refills | Status: DC | PRN

## 2020-09-18 MED ORDER — LEVOCETIRIZINE DIHYDROCHLORIDE 5 MG PO TABS: 5.0000 mg | ORAL_TABLET | Freq: Every evening | ORAL | 3 refills | Status: DC

## 2020-09-18 NOTE — Assessment & Plan Note (Addendum)
Daily chronic dry coughing for 5 years.  No triggers noted.  No prior asthma diagnosis/inhaler use.  Tried omeprazole with no benefit.  Concerned about allergic triggers.  No recent chest x-ray.  Today's skin testing showed: Positive to mold, dust mites and borderline to ragweed pollen. Negative to common foods.   Today's spirometry was normal with 4% improvement in FEV1 post bronchodilator treatment.  Clinically feeling unchanged. . The most common causes of chronic cough include the following: upper airway cough syndrome (UACS) which is caused by variety of rhinitis conditions; asthma; gastroesophageal reflux disease (GERD); chronic bronchitis from cigarette smoking or other inhaled environmental irritants; non-asthmatic eosinophilic bronchitis; and bronchiectasis.  . In prospective studies, these conditions have accounted for up to 94% of the causes of chronic cough in immunocompetent adults.  . Based on testing results, concerned for PND triggering coughing.  . Get Chest X-ray.  Start Xyzal (levocetirizine) daily at night.  Start dymista (fluticasone + azelastine nasal spray combination) 1 spray per nostril twice a day.

## 2020-09-18 NOTE — Patient Instructions (Addendum)
Today's skin testing showed: Positive to mold, dust mites and borderline to ragweed pollen. Negative to common foods.   Chronic coughing: The most common causes of chronic cough include the following: upper airway cough syndrome (UACS) which is caused by variety of rhinitis conditions; asthma; gastroesophageal reflux disease (GERD); chronic bronchitis from cigarette smoking or other inhaled environmental irritants; non-asthmatic eosinophilic bronchitis; and bronchiectasis.  In prospective studies, these conditions have accounted for up to 94% of the causes of chronic cough in immunocompetent adults.  Get Chest X-ray.  Environmental allergies Start environmental control measures as below. Start Xyzal (levocetirizine) daily at night. Start dymista (fluticasone + azelastine nasal spray combination) 1 spray per nostril twice a day. If it's not covered let us know.   Follow up in 2 months or sooner if needed.   Control of House Dust Mite Allergen Dust mite allergens are a common trigger of allergy and asthma symptoms. While they can be found throughout the house, these microscopic creatures thrive in warm, humid environments such as bedding, upholstered furniture and carpeting. Because so much time is spent in the bedroom, it is essential to reduce mite levels there.  Encase pillows, mattresses, and box springs in special allergen-proof fabric covers or airtight, zippered plastic covers.  Bedding should be washed weekly in hot water (130 F) and dried in a hot dryer. Allergen-proof covers are available for comforters and pillows that can't be regularly washed.  Wash the allergy-proof covers every few months. Minimize clutter in the bedroom. Keep pets out of the bedroom.  Keep humidity less than 50% by using a dehumidifier or air conditioning. You can buy a humidity measuring device called a hygrometer to monitor this.  If possible, replace carpets with hardwood, linoleum, or washable area rugs. If  that's not possible, vacuum frequently with a vacuum that has a HEPA filter. Remove all upholstered furniture and non-washable window drapes from the bedroom. Remove all non-washable stuffed toys from the bedroom.  Wash stuffed toys weekly. Mold Control Mold and fungi can grow on a variety of surfaces provided certain temperature and moisture conditions exist.  Outdoor molds grow on plants, decaying vegetation and soil. The major outdoor mold, Alternaria and Cladosporium, are found in very high numbers during hot and dry conditions. Generally, a late summer - fall peak is seen for common outdoor fungal spores. Rain will temporarily lower outdoor mold spore count, but counts rise rapidly when the rainy period ends. The most important indoor molds are Aspergillus and Penicillium. Dark, humid and poorly ventilated basements are ideal sites for mold growth. The next most common sites of mold growth are the bathroom and the kitchen. Outdoor (Seasonal) Mold Control Use air conditioning and keep windows closed. Avoid exposure to decaying vegetation. Avoid leaf raking. Avoid grain handling. Consider wearing a face mask if working in moldy areas.  Indoor (Perennial) Mold Control  Maintain humidity below 50%. Get rid of mold growth on hard surfaces with water, detergent and, if necessary, 5% bleach (do not mix with other cleaners). Then dry the area completely. If mold covers an area more than 10 square feet, consider hiring an indoor environmental professional. For clothing, washing with soap and water is best. If moldy items cannot be cleaned and dried, throw them away. Remove sources e.g. contaminated carpets. Repair and seal leaking roofs or pipes. Using dehumidifiers in damp basements may be helpful, but empty the water and clean units regularly to prevent mildew from forming. All rooms, especially basements, bathrooms and kitchens, require  ventilation and cleaning to deter mold and mildew growth. Avoid  carpeting on concrete or damp floors, and storing items in damp areas. Reducing Pollen Exposure Pollen seasons: trees (spring), grass (summer) and ragweed/weeds (fall). Keep windows closed in your home and car to lower pollen exposure.  Install air conditioning in the bedroom and throughout the house if possible.  Avoid going out in dry windy days - especially early morning. Pollen counts are highest between 5 - 10 AM and on dry, hot and windy days.  Save outside activities for late afternoon or after a heavy rain, when pollen levels are lower.  Avoid mowing of grass if you have grass pollen allergy. Be aware that pollen can also be transported indoors on people and pets.  Dry your clothes in an automatic dryer rather than hanging them outside where they might collect pollen.  Rinse hair and eyes before bedtime.

## 2020-09-18 NOTE — Progress Notes (Signed)
New Patient Note  RE: Jerry Freeman MRN: [id number] DOB: [birth year] Date of Office Visit: 09/18/2020  Consult requested by: Horald Pollen, * Primary care provider: Horald Pollen, MD  Chief Complaint: Cough (Last 5 years been having a cough, they thought maybe it was acid reflux and turned out not to be that, so they thought it could be allergy related, he takes allergy medication.)  History of Present Illness: I had the pleasure of seeing Jerry Freeman for initial evaluation at the [facility] [place] on 09/18/2020. He is a 65 y.o. male, who is referred here by Horald Pollen, MD for the evaluation of allergic rhinitis and coughing.  He reports symptoms of dry coughing, nasal congestion, itchy/watery eyes. Symptoms have been going on for 5 years. The symptoms are present all year around. Other triggers include exposure to unknown. Anosmia: no. Headache: no. He has used zyrtec with unknown improvement in symptoms. Sinus infections: no. Previous work up includes: none. Previous ENT evaluation: no. Previous sinus imaging: no. History of nasal polyps: no. Last eye exam: last month. History of reflux: tried omeprazole x many years with no benefit in the coughing.  He reports symptoms of dry coughing for 5 years. Current medications include none. He tried the following inhalers: none. Main triggers are unknown. In the last month, frequency of symptoms: daily. Frequency of nocturnal symptoms: 0x/month. Frequency of SABA use: 0x/week. Interference with physical activity: no. Sleep is undisturbed. In the last 12 months, emergency room visits/urgent care visits/doctor office visits or hospitalizations due to respiratory issues: no. In the last 12 months, oral steroids courses: yes for skin issues and not sure if the coughing helped. Lifetime history of hospitalization for respiratory issues: no. Prior intubations: no. History of pneumonia: in 32s. He was not evaluated by  allergist/pulmonologist in the past. Smoking exposure: quit over 30 years ago. Up to date with flu vaccine: yes. Up to date with pneumonia vaccine: yes. Up to date with COVID-19 vaccine: yes. Prior Covid-19 infection: yes in 2021. No recent CXR.  Assessment and Plan: Jerry Freeman is a 65 y.o. male with: Chronic cough Daily chronic dry coughing for 5 years.  No triggers noted.  No prior asthma diagnosis/inhaler use.  Tried omeprazole with no benefit.  Concerned about allergic triggers.  No recent chest x-ray. Today's skin testing showed: Positive to mold, dust mites and borderline to ragweed pollen. Negative to common foods.  Today's spirometry was normal with 4% improvement in FEV1 post bronchodilator treatment.  Clinically feeling unchanged. The most common causes of chronic cough include the following: upper airway cough syndrome (UACS) which is caused by variety of rhinitis conditions; asthma; gastroesophageal reflux disease (GERD); chronic bronchitis from cigarette smoking or other inhaled environmental irritants; non-asthmatic eosinophilic bronchitis; and bronchiectasis.  In prospective studies, these conditions have accounted for up to 94% of the causes of chronic cough in immunocompetent adults.  Based on testing results, concerned for PND triggering coughing.  Get Chest X-ray. Start Xyzal (levocetirizine) daily at night. Start dymista (fluticasone + azelastine nasal spray combination) 1 spray per nostril twice a day.  Other allergic rhinitis Perennial rhinoconjunctivitis symptoms for 5 years.  Tried Zyrtec with unknown benefit.  No prior allergy testing. Today's skin testing showed: Positive to mold, dust mites and borderline to ragweed pollen. Start environmental control measures as below. Start Xyzal (levocetirizine) daily at night. Start dymista (fluticasone + azelastine nasal spray combination) 1 spray per nostril twice a day. If it's not covered  let us know.   Return in about 2 months  (around 11/19/2020).  Meds ordered this encounter  Medications   levocetirizine (XYZAL) 5 MG tablet    Sig: Take 1 tablet (5 mg total) by mouth every evening.    Dispense:  30 tablet    Refill:  3   Azelastine-Fluticasone 137-50 MCG/ACT SUSP    Sig: Place 1 spray into the nose in the morning and at bedtime.    Dispense:  23 g    Refill:  3   fluticasone (FLONASE) 50 MCG/ACT nasal spray    Sig: Place 1 spray into both nostrils 2 (two) times daily as needed for allergies or rhinitis.    Dispense:  16 g    Refill:  5   azelastine (ASTELIN) 0.1 % nasal spray    Sig: Place 1 spray into both nostrils 2 (two) times daily as needed for rhinitis.    Dispense:  30 mL    Refill:  5    Lab Orders  No laboratory test(s) ordered today    Other allergy screening: Food allergy: no Medication allergy: no Hymenoptera allergy: no Urticaria: no Eczema:no History of recurrent infections suggestive of immunodeficency: no  Diagnostics: Spirometry:  Tracings reviewed. His effort: Good reproducible efforts. FVC: 4.12L FEV1: 3.11L, 97% predicted FEV1/FVC ratio: 75% Interpretation: Spirometry consistent with normal pattern with 4% improvement in FEV1 post bronchodilator treatment. Clinically feeling unchanged.   Please see scanned spirometry results for details.  Skin Testing: Environmental allergy panel and select foods. Positive to mold, dust mites and borderline to ragweed pollen. Negative to common foods.  Results discussed with patient/family.  Airborne Adult Perc - 09/18/20 1408     Time Antigen Placed 1408    Allergen Manufacturer Lavella Hammock    Location Back    Number of Test 59    Panel 1 Select    1. Control-Buffer 50% Glycerol Negative    2. Control-Histamine 1 mg/ml 2+    3. Albumin saline Negative    4. Cary Negative    5. [place] Negative    6. Johnson Negative    7. [place] Blue Negative    8. Meadow Fescue Negative    9. Perennial Rye Negative    10. Sweet Vernal  Negative    11. Timothy Negative    12. Cocklebur Negative    13. Burweed Marshelder Negative    14. Ragweed, short Negative    15. Ragweed, Giant Negative    16. Plantain,  English Negative    17. Lamb's Quarters Negative    18. Sheep Sorrell Negative    19. Rough Pigweed Negative    20. Marsh Elder, Rough Negative    21. Mugwort, Common Negative    22. Ash mix Negative    23. Birch mix Negative    24. Beech American Negative    25. Box, Elder Negative    26. Cedar, red Negative    27. Cottonwood, [place] Negative    28. Elm mix Negative    29. Hickory Negative    30. Maple mix Negative    31. Oak, [place] mix Negative    32. Pecan Pollen Negative    33. Pine mix Negative    34. Sycamore Eastern Negative    35. Granite Shoals, Black Pollen Negative    36. Alternaria alternata Negative    37. Cladosporium Herbarum Negative    38. Aspergillus mix Negative    39. Penicillium mix Negative    40. Bipolaris sorokiniana (Helminthosporium)  Negative    41. Drechslera spicifera (Curvularia) Negative    42. Mucor plumbeus Negative    43. Fusarium moniliforme Negative    44. Aureobasidium pullulans (pullulara) Negative    45. Rhizopus oryzae Negative    46. Botrytis cinera Negative    47. Epicoccum nigrum Negative    48. Phoma betae Negative    49. Candida Albicans Negative    50. Trichophyton mentagrophytes Negative    51. Mite, D Farinae  5,000 AU/ml Negative    52. Mite, D Pteronyssinus  5,000 AU/ml Negative    53. Cat Hair 10,000 BAU/ml Negative    54.  Dog Epithelia Negative    55. Mixed Feathers Negative    56. Horse Epithelia Negative    57. Cockroach, German Negative    58. Mouse Negative    59. Tobacco Leaf Negative             Food Perc - 09/18/20 1409       Test Information   Time Antigen Placed 1409    Allergen Manufacturer Lavella Hammock    Location Back    Number of allergen test [place]   1. Peanut Negative    2. Soybean food Negative    3.  Wheat, whole Negative    4. Sesame Negative    5. Milk, cow Negative    6. Egg White, chicken Negative    7. Casein Negative    8. Shellfish mix Negative    9. Fish mix Negative    10. Cashew Negative             Intradermal - 09/18/20 1520     Time Antigen Placed 0300    Allergen Manufacturer Greer    Location Arm    Number of Test 15    Intradermal Select    Control Negative    Guatemala Negative    Johnson Negative    7 Grass Negative    Ragweed mix --   +/-   Weed mix Negative    Tree mix Negative    Mold 1 Negative    Mold 2 Negative    Mold 3 3+    Mold 4 3+    Cat Negative    Dog Negative    Cockroach Negative    Mite mix 2+             Past Medical History: Patient Active Problem List   Diagnosis Date Noted   Essential hypertension 07/24/2020   Environmental allergies 07/24/2020   Chronic cough 07/24/2020   Benign prostatic hyperplasia with urinary obstruction 11/14/2019   Urinary retention 10/05/2019   DDD (degenerative disc disease), lumbar 11/20/2013   Other allergic rhinitis 12/11/2011   Family history of colon cancer 12/11/2011   Cancer of thyroid ([place]) 12/11/2011   Hypothyroidism 08/16/2011   ED (erectile dysfunction) 08/16/2011   Past Medical History:  Diagnosis Date   Arthritis    Cancer ([place])    thyroid    Eczema    Hypertension    Hypothyroidism    Seasonal allergies    Past Surgical History: Past Surgical History:  Procedure Laterality Date   ADENOIDECTOMY     HERNIA REPAIR     13 yrs ago   [place] Right 09/28/2019   Procedure: REPAIR QUADRICEP TENDON;  Surgeon: Sydnee Cabal, MD;  Location: [facility];  Service: Orthopedics;  Laterality: Right;   rupture quad tenden  THYROIDECTOMY     4 or 5 yrs ago   TONSILLECTOMY     Medication List:  Current Outpatient Medications  Medication Sig Dispense Refill   amLODipine (NORVASC) 5 MG tablet TAKE 1 TABLET DAILY 90 tablet 3   Ascorbic Acid (VITAMIN C) 1000  MG tablet Take 1,000 mg by mouth daily.     azelastine (ASTELIN) 0.1 % nasal spray Place 1 spray into both nostrils 2 (two) times daily as needed for rhinitis. 30 mL 5   Azelastine-Fluticasone 137-50 MCG/ACT SUSP Place 1 spray into the nose in the morning and at bedtime. 23 g 3   Calcium-Phosphorus-Vitamin D (CITRACAL +D3 PO) Take 1 tablet by mouth daily.     fluticasone (FLONASE) 50 MCG/ACT nasal spray Place 1 spray into both nostrils 2 (two) times daily as needed for allergies or rhinitis. 16 g 5   Krill Oil 350 MG CAPS Take 350 mg by mouth daily.     levocetirizine (XYZAL) 5 MG tablet Take 1 tablet (5 mg total) by mouth every evening. 30 tablet 3   levothyroxine (SYNTHROID) 125 MCG tablet Take 1 tablet (125 mcg total) by mouth daily. 90 tablet 3   meloxicam (MOBIC) 7.5 MG tablet Take 1 tablet (7.5 mg total) by mouth daily as needed (pain.). 30 tablet 5   Multiple Vitamins-Minerals (MEGA MULTI MEN PO) Take 1 packet by mouth daily. GNC 50 PLUS      olopatadine (PATANOL) 0.1 % ophthalmic solution Place 1 drop into both eyes 2 (two) times daily as needed (irritated/dry eyes.).     sildenafil (VIAGRA) 100 MG tablet TAKE 1 TABLET DAILY AS NEEDED FOR ERECTILE DYSFUNCTION AT LEAST 1 HOUR PRIOR TO SEX. 30 tablet 5   tamsulosin (FLOMAX) 0.4 MG CAPS capsule Take 1 capsule (0.4 mg total) by mouth daily after supper. 90 capsule 3   No current facility-administered medications for this visit.   Allergies: No Known Allergies Social History: Social History   Socioeconomic History   Marital status: Married    Spouse name: Not on file   Number of children: 2   Years of education: 16   Highest education level: Not on file  Occupational History   Occupation: rail roader  Tobacco Use   Smoking status: Former    Years: 18.00    Pack years: 0.00    Types: Cigarettes    Quit date: 12/15/1991    Years since quitting: 28.7   Smokeless tobacco: Never   Tobacco comments:    also smoked marijuana,and  cocaine (free-base)   Vaping Use   Vaping Use: Never used  Substance and Sexual Activity   Alcohol use: Not Currently    Alcohol/week: 0.0 standard drinks    Comment: hx of 30 years ago    Drug use: Not Currently    Comment: lst 30 years ago    Sexual activity: Not on file  Other Topics Concern   Not on file  Social History Narrative   Exercise cardio and weights 4 days/wk for 1 hour   Social Determinants of Health   Financial Resource Strain: Not on file  Food Insecurity: Not on file  Transportation Needs: Not on file  Physical Activity: Not on file  Stress: Not on file  Social Connections: Not on file   Lives in a house which is 67 years old. 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 History: Water Damage/mildew in the house: no Charity fundraiser in the family room: no Charity fundraiser in  the bedroom: no Heating: gas Cooling: central Pet: no  Family History: Family History  Problem Relation Age of Onset   Cancer Mother        lung   Cancer Father        rectal   High Cholesterol Brother    Hypertension Brother    Stroke Brother    Kidney disease Maternal Grandmother        dialysis   Hypertension Maternal Grandmother    Hyperlipidemia Maternal Grandmother    Arthritis Maternal Grandfather    Allergic rhinitis Neg Hx    Asthma Neg Hx    Eczema Neg Hx    Urticaria Neg Hx    Review of Systems  Constitutional:  Negative for appetite change, chills, fever and unexpected weight change.  HENT:  Negative for congestion and rhinorrhea.   Eyes:  Positive for itching.  Respiratory:  Positive for cough. Negative for chest tightness, shortness of breath and wheezing.   Cardiovascular:  Negative for chest pain.  Gastrointestinal:  Negative for abdominal pain.  Genitourinary:  Negative for difficulty urinating.  Skin:  Negative for rash.  Allergic/Immunologic: Positive for environmental allergies. Negative for food allergies.  Neurological:   Negative for headaches.   Objective: BP 136/80 (BP Location: Right Arm, Patient Position: Sitting, Cuff Size: Normal)   Pulse 66   Temp 97.8 F (36.6 C) (Temporal)   Resp 18   Ht 6' 0.64" (1.845 m)   Wt 212 lb (96.2 kg)   SpO2 97%   BMI 28.25 kg/m  Body mass index is 28.25 kg/m. Physical Exam Vitals and nursing note reviewed.  Constitutional:      Appearance: Normal appearance. He is well-developed.  HENT:     Head: Normocephalic and atraumatic.     Right Ear: Tympanic membrane and external ear normal.     Left Ear: Tympanic membrane and external ear normal.     Nose: Congestion (left side) present.     Mouth/Throat:     Mouth: Mucous membranes are moist.     Pharynx: Oropharynx is clear.  Eyes:     Conjunctiva/sclera: Conjunctivae normal.  Cardiovascular:     Rate and Rhythm: Normal rate and regular rhythm.     Heart sounds: Normal heart sounds. No murmur heard.   No friction rub. No gallop.  Pulmonary:     Effort: Pulmonary effort is normal.     Breath sounds: Normal breath sounds. No wheezing, rhonchi or rales.  Musculoskeletal:     Cervical back: Neck supple.  Skin:    General: Skin is warm.     Findings: No rash.  Neurological:     Mental Status: He is alert and oriented to person, place, and time.  Psychiatric:        Behavior: Behavior normal.  The plan was reviewed with the patient/family, and all questions/concerned were addressed.  It was my pleasure to see Jerry Freeman today and participate in his care. Please feel free to contact me with any questions or concerns.  Sincerely,  Rexene Alberts, DO Allergy & Immunology  [facility] office: [place] office: [phone]

## 2020-09-18 NOTE — Assessment & Plan Note (Signed)
Perennial rhinoconjunctivitis symptoms for 5 years.  Tried Zyrtec with unknown benefit.  No prior allergy testing.  Today's skin testing showed: Positive to mold, dust mites and borderline to ragweed pollen.  Start environmental control measures as below.  Start Xyzal (levocetirizine) daily at night.  Start dymista (fluticasone + azelastine nasal spray combination) 1 spray per nostril twice a day.  If it's not covered let us know.

## 2020-09-19 ENCOUNTER — Encounter: Payer: Self-pay | Admitting: Allergy

## 2020-10-01 ENCOUNTER — Telehealth: Payer: Self-pay | Admitting: Emergency Medicine

## 2020-10-01 DIAGNOSIS — M5136 Other intervertebral disc degeneration, lumbar region: Secondary | ICD-10-CM

## 2020-10-01 DIAGNOSIS — I10 Essential (primary) hypertension: Secondary | ICD-10-CM

## 2020-10-01 NOTE — Telephone Encounter (Signed)
Patient requesting refill for  amLODipine (NORVASC) 5 MG tablet  levothyroxine (SYNTHROID) 125 MCG tablet  meloxicam (MOBIC) 7.5 MG tablet  Pharmacy [facility], [place] to Registered [facility]

## 2020-10-02 MED ORDER — AMLODIPINE BESYLATE 5 MG PO TABS: ORAL_TABLET | ORAL | 3 refills | Status: DC

## 2020-10-02 NOTE — Telephone Encounter (Signed)
Pt called back he is needing the levothyroxine (SYNTHROID) 125 MCG tablet and meloxicam (MOBIC) 7.5 MG tablet sent to [place] he is no longer using [facility]

## 2020-10-02 NOTE — Telephone Encounter (Signed)
Refilled amlodipine 5 mg. Levothyroxine and meloxicam has additional refills and was not refilled today.

## 2020-10-03 MED ORDER — LEVOTHYROXINE SODIUM 125 MCG PO TABS: 125.0000 ug | ORAL_TABLET | Freq: Every day | ORAL | 3 refills | Status: DC

## 2020-10-03 MED ORDER — AMLODIPINE BESYLATE 5 MG PO TABS
ORAL_TABLET | ORAL | 3 refills | Status: DC
Start: 2020-10-03 — End: 2021-05-26

## 2020-10-03 MED ORDER — MELOXICAM 7.5 MG PO TABS: 7.5000 mg | ORAL_TABLET | Freq: Every day | ORAL | 5 refills | Status: DC | PRN

## 2020-10-03 NOTE — Addendum Note (Signed)
Addended by: Durwin Nora on: 10/03/2020 10:12 AM   Modules accepted: Orders

## 2020-10-03 NOTE — Telephone Encounter (Signed)
Refilled medications [facility].

## 2020-10-06 DIAGNOSIS — H40013 Open angle with borderline findings, low risk, bilateral: Secondary | ICD-10-CM | POA: Diagnosis not present

## 2020-10-06 DIAGNOSIS — H25813 Combined forms of age-related cataract, bilateral: Secondary | ICD-10-CM | POA: Diagnosis not present

## 2020-10-10 ENCOUNTER — Other Ambulatory Visit: Payer: Self-pay | Admitting: *Deleted

## 2020-10-10 MED ORDER — LEVOCETIRIZINE DIHYDROCHLORIDE 5 MG PO TABS: 5.0000 mg | ORAL_TABLET | Freq: Every evening | ORAL | 1 refills | Status: DC

## 2020-11-20 ENCOUNTER — Ambulatory Visit: Payer: Medicare HMO | Admitting: Allergy

## 2020-12-01 ENCOUNTER — Other Ambulatory Visit: Payer: Self-pay | Admitting: Urology

## 2020-12-03 NOTE — Progress Notes (Signed)
Follow Up Note  RE: Jerry Freeman MRN: [id number] DOB: [birth year] Date of Office Visit: 12/04/2020  Referring provider: Horald Pollen, * Primary care provider: Horald Pollen, MD  Chief Complaint: coughing  History of Present Illness: I had the pleasure of seeing Jerry Freeman for a follow up visit at the [facility] [place] [facility] on 12/04/2020. He is a 65 y.o. male, who is being followed for chronic cough, allergic rhino conjunctivitis. His previous allergy office visit was on 09/18/2020 with Dr. Maudie Mercury. Today is a regular follow up visit.  Chronic cough/rhinitis Patient did not get the chest X-ray as he did not know there was one ordered for him - even though it was ordered at the last visit and written in his last AVS.  He is still coughing daily about 15-20 times per day and thinks it maybe a little less. Did not notice any triggers or correlations. He has been coughing like this for 5+ years.   There was some confusion on patient's behalf and he didn't know he could use the azelastine and the Flonase together. He finished using the azelastine nasal spray and now using the Flonase. He is not sure if they are helping.  He is still taking Xyzal daily. He thinks it works the same as zyrtec.   Patient had thyroid cancer and noticing some voice hoarseness and voice changes since then. This maybe getting worse though lately.   Denies any rhinitis symptoms. He did get new pillows.  Assessment and Plan: Jerry Freeman is a 65 y.o. male with: Chronic cough Past history - Daily chronic dry coughing for 5 years.  No triggers noted.  No prior asthma diagnosis/inhaler use.  Tried omeprazole with no benefit. Interim history - did not get Chest X-ray. Coughing is about the same.  Get Chest X-ray. Refer to ENT for further evaluation - diagnosis chronic cough and voice hoarseness.  If work up is normal then will refer to speech therapy next for chronic cough.  Voice hoarseness See  assessment and plan as above.  Other allergic rhinitis Past history - Perennial rhinoconjunctivitis symptoms for 5 years.  2022 skin testing showed: Positive to mold, dust mites and borderline to ragweed pollen. Interim history - got new pillow, zyrtec and Xyzal works about the same. Due to miscommunication was not using nasal sprays correctly.  Continue environmental control measures as below. Continue Xyzal (levocetirizine) daily at night. Use Flonase (fluticasone) nasal spray 1 spray per nostril twice a day as needed for nasal congestion.  Use azelastine nasal spray 1-2 sprays per nostril twice a day as needed for runny nose/drainage.  Return in about 3 months (around 03/05/2021).  Meds ordered this encounter  Medications   azelastine (ASTELIN) 0.1 % nasal spray    Sig: Place 1-2 sprays into both nostrils 2 (two) times daily as needed (nasal drainage). Use in each nostril as directed    Dispense:  30 mL    Refill:  5   fluticasone (FLONASE) 50 MCG/ACT nasal spray    Sig: Place 1 spray into both nostrils 2 (two) times daily as needed (nasal congestion).    Dispense:  16 g    Refill:  5    Lab Orders  No laboratory test(s) ordered today    Diagnostics: None.  Medication List:  Current Outpatient Medications  Medication Sig Dispense Refill   amLODipine (NORVASC) 5 MG tablet TAKE 1 TABLET DAILY 90 tablet 3   Ascorbic Acid (VITAMIN C) 1000 MG tablet  Take 1,000 mg by mouth daily.     azelastine (ASTELIN) 0.1 % nasal spray Place 1-2 sprays into both nostrils 2 (two) times daily as needed (nasal drainage). Use in each nostril as directed 30 mL 5   Calcium-Phosphorus-Vitamin D (CITRACAL +D3 PO) Take 1 tablet by mouth daily.     fluticasone (FLONASE) 50 MCG/ACT nasal spray Place 1 spray into both nostrils 2 (two) times daily as needed (nasal congestion). 16 g 5   Krill Oil 350 MG CAPS Take 350 mg by mouth daily.     levocetirizine (XYZAL) 5 MG tablet Take 1 tablet (5 mg total) by  mouth every evening. 90 tablet 1   levothyroxine (SYNTHROID) 125 MCG tablet Take 1 tablet (125 mcg total) by mouth daily. 90 tablet 3   meloxicam (MOBIC) 7.5 MG tablet Take 1 tablet (7.5 mg total) by mouth daily as needed (pain.). 30 tablet 5   Multiple Vitamins-Minerals (MEGA MULTI MEN PO) Take 1 packet by mouth daily. GNC 50 PLUS      olopatadine (PATANOL) 0.1 % ophthalmic solution Place 1 drop into both eyes 2 (two) times daily as needed (irritated/dry eyes.).     sildenafil (VIAGRA) 100 MG tablet TAKE 1 TABLET DAILY AS NEEDED FOR ERECTILE DYSFUNCTION AT LEAST 1 HOUR PRIOR TO SEX. 30 tablet 5   tamsulosin (FLOMAX) 0.4 MG CAPS capsule TAKE 1 CAPSULE DAILY AFTER SUPPER 90 capsule 3   No current facility-administered medications for this visit.   Allergies: No Known Allergies I reviewed his past medical history, social history, family history, and environmental history and no significant changes have been reported from his previous visit.  Review of Systems  Constitutional:  Negative for appetite change, chills, fever and unexpected weight change.  HENT:  Positive for voice change. Negative for congestion and rhinorrhea.   Eyes:  Positive for itching.  Respiratory:  Positive for cough. Negative for chest tightness, shortness of breath and wheezing.   Cardiovascular:  Negative for chest pain.  Gastrointestinal:  Negative for abdominal pain.  Genitourinary:  Negative for difficulty urinating.  Skin:  Negative for rash.  Allergic/Immunologic: Positive for environmental allergies. Negative for food allergies.  Neurological:  Negative for headaches.   Objective: BP 120/80   Pulse 66   Temp 98.6 F (37 C) (Temporal)   Resp 16   SpO2 98%  There is no height or weight on file to calculate BMI. Physical Exam Vitals (no coughing during OV) and nursing note reviewed.  Constitutional:      Appearance: Normal appearance. He is well-developed.  HENT:     Head: Normocephalic and atraumatic.      Right Ear: Tympanic membrane and external ear normal.     Left Ear: Tympanic membrane and external ear normal.     Nose: Nose normal.     Mouth/Throat:     Mouth: Mucous membranes are moist.     Pharynx: Oropharynx is clear.  Eyes:     Conjunctiva/sclera: Conjunctivae normal.  Cardiovascular:     Rate and Rhythm: Normal rate and regular rhythm.     Heart sounds: Normal heart sounds. No murmur heard.   No friction rub. No gallop.  Pulmonary:     Effort: Pulmonary effort is normal.     Breath sounds: Normal breath sounds. No wheezing, rhonchi or rales.  Musculoskeletal:     Cervical back: Neck supple.  Skin:    General: Skin is warm.     Findings: No rash.  Neurological:  Mental Status: He is alert and oriented to person, place, and time.  Psychiatric:        Behavior: Behavior normal.  Previous notes and tests were reviewed. The plan was reviewed with the patient/family, and all questions/concerned were addressed.  It was my pleasure to see Garland today and participate in his care. Please feel free to contact me with any questions or concerns.  Sincerely,  Rexene Alberts, DO Allergy & Immunology  [facility] office: [place] office: [phone]

## 2020-12-04 ENCOUNTER — Telehealth: Payer: Self-pay

## 2020-12-04 ENCOUNTER — Ambulatory Visit: Payer: Medicare HMO | Admitting: Allergy

## 2020-12-04 ENCOUNTER — Other Ambulatory Visit: Payer: Self-pay

## 2020-12-04 ENCOUNTER — Encounter: Payer: Self-pay | Admitting: Allergy

## 2020-12-04 VITALS — BP 120/80 | HR 66 | Temp 98.6°F | Resp 16

## 2020-12-04 DIAGNOSIS — R053 Chronic cough: Secondary | ICD-10-CM | POA: Diagnosis not present

## 2020-12-04 DIAGNOSIS — R490 Dysphonia: Secondary | ICD-10-CM

## 2020-12-04 DIAGNOSIS — J3089 Other allergic rhinitis: Secondary | ICD-10-CM

## 2020-12-04 MED ORDER — AZELASTINE HCL 0.1 % NA SOLN: 1.0000 | Freq: Two times a day (BID) | NASAL | 5 refills | Status: DC | PRN

## 2020-12-04 MED ORDER — FLUTICASONE PROPIONATE 50 MCG/ACT NA SUSP: 1.0000 | Freq: Two times a day (BID) | NASAL | 5 refills | Status: DC | PRN

## 2020-12-04 NOTE — Assessment & Plan Note (Signed)
Past history - Perennial rhinoconjunctivitis symptoms for 5 years.  2022 skin testing showed: Positive to mold, dust mites and borderline to ragweed pollen. Interim history - got new pillow, zyrtec and Xyzal works about the same. Due to miscommunication was not using nasal sprays correctly.   Continue environmental control measures as below.  Continue Xyzal (levocetirizine) daily at night. . Use Flonase (fluticasone) nasal spray 1 spray per nostril twice a day as needed for nasal congestion.   Use azelastine nasal spray 1-2 sprays per nostril twice a day as needed for runny nose/drainage.

## 2020-12-04 NOTE — Patient Instructions (Addendum)
Chronic coughing: The most common causes of chronic cough include the following: upper airway cough syndrome (UACS) which is caused by variety of rhinitis conditions; asthma; gastroesophageal reflux disease (GERD); chronic bronchitis from cigarette smoking or other inhaled environmental irritants; non-asthmatic eosinophilic bronchitis; and bronchiectasis.  In prospective studies, these conditions have accounted for up to 94% of the causes of chronic cough in immunocompetent adults.  Get Chest X-ray. Refer to ENT for further evaluation - diagnosis chronic cough and voice hoarseness.   Environmental allergies 2022 skin testing showed: Positive to mold, dust mites and borderline to ragweed pollen. Continue environmental control measures as below. Continue Xyzal (levocetirizine) daily at night. Use Flonase (fluticasone) nasal spray 1 spray per nostril twice a day as needed for nasal congestion.  Use azelastine nasal spray 1-2 sprays per nostril twice a day as needed for runny nose/drainage.  Follow up in 3 months or sooner if needed.   Control of House Dust Mite Allergen Dust mite allergens are a common trigger of allergy and asthma symptoms. While they can be found throughout the house, these microscopic creatures thrive in warm, humid environments such as bedding, upholstered furniture and carpeting. Because so much time is spent in the bedroom, it is essential to reduce mite levels there.  Encase pillows, mattresses, and box springs in special allergen-proof fabric covers or airtight, zippered plastic covers.  Bedding should be washed weekly in hot water (130 F) and dried in a hot dryer. Allergen-proof covers are available for comforters and pillows that can't be regularly washed.  Wash the allergy-proof covers every few months. Minimize clutter in the bedroom. Keep pets out of the bedroom.  Keep humidity less than 50% by using a dehumidifier or air conditioning. You can buy a humidity measuring  device called a hygrometer to monitor this.  If possible, replace carpets with hardwood, linoleum, or washable area rugs. If that's not possible, vacuum frequently with a vacuum that has a HEPA filter. Remove all upholstered furniture and non-washable window drapes from the bedroom. Remove all non-washable stuffed toys from the bedroom.  Wash stuffed toys weekly. Mold Control Mold and fungi can grow on a variety of surfaces provided certain temperature and moisture conditions exist.  Outdoor molds grow on plants, decaying vegetation and soil. The major outdoor mold, Alternaria and Cladosporium, are found in very high numbers during hot and dry conditions. Generally, a late summer - fall peak is seen for common outdoor fungal spores. Rain will temporarily lower outdoor mold spore count, but counts rise rapidly when the rainy period ends. The most important indoor molds are Aspergillus and Penicillium. Dark, humid and poorly ventilated basements are ideal sites for mold growth. The next most common sites of mold growth are the bathroom and the kitchen. Outdoor (Seasonal) Mold Control Use air conditioning and keep windows closed. Avoid exposure to decaying vegetation. Avoid leaf raking. Avoid grain handling. Consider wearing a face mask if working in moldy areas.  Indoor (Perennial) Mold Control  Maintain humidity below 50%. Get rid of mold growth on hard surfaces with water, detergent and, if necessary, 5% bleach (do not mix with other cleaners). Then dry the area completely. If mold covers an area more than 10 square feet, consider hiring an indoor environmental professional. For clothing, washing with soap and water is best. If moldy items cannot be cleaned and dried, throw them away. Remove sources e.g. contaminated carpets. Repair and seal leaking roofs or pipes. Using dehumidifiers in damp basements may be helpful, but empty  the water and clean units regularly to prevent mildew from forming.  All rooms, especially basements, bathrooms and kitchens, require ventilation and cleaning to deter mold and mildew growth. Avoid carpeting on concrete or damp floors, and storing items in damp areas. Reducing Pollen Exposure Pollen seasons: trees (spring), grass (summer) and ragweed/weeds (fall). Keep windows closed in your home and car to lower pollen exposure.  Install air conditioning in the bedroom and throughout the house if possible.  Avoid going out in dry windy days - especially early morning. Pollen counts are highest between 5 - 10 AM and on dry, hot and windy days.  Save outside activities for late afternoon or after a heavy rain, when pollen levels are lower.  Avoid mowing of grass if you have grass pollen allergy. Be aware that pollen can also be transported indoors on people and pets.  Dry your clothes in an automatic dryer rather than hanging them outside where they might collect pollen.  Rinse hair and eyes before bedtime.

## 2020-12-04 NOTE — Assessment & Plan Note (Addendum)
Past history - Daily chronic dry coughing for 5 years.  No triggers noted.  No prior asthma diagnosis/inhaler use.  Tried omeprazole with no benefit. Interim history - did not get Chest X-ray. Coughing is about the same.  . Get Chest X-ray. Marland Kitchen Refer to ENT for further evaluation - diagnosis chronic cough and voice hoarseness.  . If work up is normal then will refer to speech therapy next for chronic cough.

## 2020-12-04 NOTE — Telephone Encounter (Signed)
Per Dr Maudie Mercury needs referral to ENT for chronic cough and voice hoarseness please and thank you

## 2020-12-04 NOTE — Assessment & Plan Note (Signed)
.   See assessment and plan as above. 

## 2020-12-05 ENCOUNTER — Ambulatory Visit (HOSPITAL_COMMUNITY)
Admission: RE | Admit: 2020-12-05 | Discharge: 2020-12-05 | Disposition: A | Discharge status: Home / Self Care | Payer: Medicare HMO | Source: Ambulatory Visit | Attending: Allergy | Admitting: Allergy

## 2020-12-05 DIAGNOSIS — R053 Chronic cough: Secondary | ICD-10-CM | POA: Diagnosis not present

## 2020-12-05 DIAGNOSIS — R059 Cough, unspecified: Secondary | ICD-10-CM | POA: Diagnosis not present

## 2020-12-11 NOTE — Telephone Encounter (Signed)
Pt called following up on ent referral

## 2020-12-30 ENCOUNTER — Encounter: Payer: Medicare HMO | Admitting: Emergency Medicine

## 2020-12-30 DIAGNOSIS — R490 Dysphonia: Secondary | ICD-10-CM | POA: Diagnosis not present

## 2020-12-30 DIAGNOSIS — R053 Chronic cough: Secondary | ICD-10-CM | POA: Diagnosis not present

## 2020-12-30 DIAGNOSIS — R040 Epistaxis: Secondary | ICD-10-CM | POA: Diagnosis not present

## 2021-01-02 DIAGNOSIS — H25811 Combined forms of age-related cataract, right eye: Secondary | ICD-10-CM | POA: Diagnosis not present

## 2021-01-02 DIAGNOSIS — H2511 Age-related nuclear cataract, right eye: Secondary | ICD-10-CM | POA: Diagnosis not present

## 2021-01-12 ENCOUNTER — Encounter: Payer: Self-pay | Admitting: Emergency Medicine

## 2021-01-12 ENCOUNTER — Other Ambulatory Visit: Payer: Self-pay

## 2021-01-12 ENCOUNTER — Other Ambulatory Visit: Payer: Self-pay | Admitting: Emergency Medicine

## 2021-01-12 ENCOUNTER — Ambulatory Visit (INDEPENDENT_AMBULATORY_CARE_PROVIDER_SITE_OTHER): Payer: Medicare HMO | Admitting: Emergency Medicine

## 2021-01-12 VITALS — BP 122/68 | HR 57 | Temp 98.2°F | Ht 72.0 in | Wt 219.0 lb

## 2021-01-12 DIAGNOSIS — N401 Enlarged prostate with lower urinary tract symptoms: Secondary | ICD-10-CM

## 2021-01-12 DIAGNOSIS — Z0000 Encounter for general adult medical examination without abnormal findings: Secondary | ICD-10-CM | POA: Diagnosis not present

## 2021-01-12 DIAGNOSIS — I10 Essential (primary) hypertension: Secondary | ICD-10-CM

## 2021-01-12 DIAGNOSIS — E785 Hyperlipidemia, unspecified: Secondary | ICD-10-CM

## 2021-01-12 DIAGNOSIS — N138 Other obstructive and reflux uropathy: Secondary | ICD-10-CM | POA: Diagnosis not present

## 2021-01-12 DIAGNOSIS — C73 Malignant neoplasm of thyroid gland: Secondary | ICD-10-CM | POA: Diagnosis not present

## 2021-01-12 DIAGNOSIS — E890 Postprocedural hypothyroidism: Secondary | ICD-10-CM | POA: Diagnosis not present

## 2021-01-12 DIAGNOSIS — Z23 Encounter for immunization: Secondary | ICD-10-CM | POA: Diagnosis not present

## 2021-01-12 LAB — COMPREHENSIVE METABOLIC PANEL
ALT: 28 U/L (ref 0–53)
AST: 34 U/L (ref 0–37)
Albumin: 4.1 g/dL (ref 3.5–5.2)
Alkaline Phosphatase: 51 U/L (ref 39–117)
BUN: 15 mg/dL (ref 6–23)
CO2: 29 mEq/L (ref 19–32)
Calcium: 8.6 mg/dL (ref 8.4–10.5)
Chloride: 103 mEq/L (ref 96–112)
Creatinine, Ser: 1.21 mg/dL (ref 0.40–1.50)
GFR: 61.91 mL/min (ref >60.00)
Glucose, Bld: 78 mg/dL (ref 70–99)
Potassium: 4.2 mEq/L (ref 3.5–5.1)
Sodium: 138 mEq/L (ref 135–145)
Total Bilirubin: 1.1 mg/dL (ref 0.2–1.2)
Total Protein: 6.8 g/dL (ref 6.0–8.3)

## 2021-01-12 LAB — LIPID PANEL
Cholesterol: 211 mg/dL — ABNORMAL HIGH (ref 0–200)
HDL: 81.1 mg/dL (ref >39.00)
LDL Cholesterol: 116 mg/dL — ABNORMAL HIGH (ref 0–99)
NonHDL: 130.35
Total CHOL/HDL Ratio: 3
Triglycerides: 71 mg/dL (ref 0.0–149.0)
VLDL: 14.2 mg/dL (ref 0.0–40.0)

## 2021-01-12 LAB — TSH: TSH: 8.69 u[IU]/mL — ABNORMAL HIGH (ref 0.35–5.50)

## 2021-01-12 LAB — HEMOGLOBIN A1C: Hgb A1c MFr Bld: 5.6 % (ref 4.6–6.5)

## 2021-01-12 MED ORDER — ROSUVASTATIN CALCIUM 10 MG PO TABS: 10.0000 mg | ORAL_TABLET | Freq: Every day | ORAL | 3 refills | Status: DC

## 2021-01-12 NOTE — Progress Notes (Signed)
Jerry Freeman 65 y.o.   Chief Complaint  Patient presents with   Annual Exam    HISTORY OF PRESENT ILLNESS: This is a 65 y.o. male here for annual exam. Doing well.  Has no complaints or medical concerns today. Has the following chronic medical conditions: 1.  Essential hypertension: On amlodipine 5 mg daily. 2.  History of thyroid cancer with postop hypothyroidism.  On Synthroid 125 mcg daily.  Doing well. 3.  History of BPH with lower urinary tract symptoms responding very well to Flomax 0.4 mg daily 4.  Chronic respiratory allergies.  Has seen allergist and ENT doctor recently without any significant findings.  Recent chest x-ray reviewed with patient within normal limits. 5.  Occasional joint pains responding to meloxicam 7.5 mg as needed. Stays physically active.  Plays golf on a regular basis.  Walks a lot.  Eating well.  Sleeping well.  Non-smoker.  HPI   Prior to Admission medications   Medication Sig Start Date End Date Taking? Authorizing Provider  amLODipine (NORVASC) 5 MG tablet TAKE 1 TABLET DAILY 10/03/20  Yes Donnajean Chesnut, Ines Bloomer, MD  Ascorbic Acid (VITAMIN C) 1000 MG tablet Take 1,000 mg by mouth daily.   Yes [provider]  azelastine (ASTELIN) 0.1 % nasal spray Place 1-2 sprays into both nostrils 2 (two) times daily as needed (nasal drainage). Use in each nostril as directed 12/04/20  Yes Garnet Sierras, DO  Calcium-Phosphorus-Vitamin D (CITRACAL +D3 PO) Take 1 tablet by mouth daily.   Yes [provider]  fluticasone (FLONASE) 50 MCG/ACT nasal spray Place 1 spray into both nostrils 2 (two) times daily as needed (nasal congestion). 12/04/20  Yes Garnet Sierras, DO  Krill Oil 350 MG CAPS Take 350 mg by mouth daily.   Yes [provider]  levocetirizine (XYZAL) 5 MG tablet Take 1 tablet (5 mg total) by mouth every evening. 10/10/20  Yes Garnet Sierras, DO  levothyroxine (SYNTHROID) 125 MCG tablet Take 1 tablet (125 mcg total) by mouth daily. 10/03/20  Yes  Lamarion Mcevers, Ines Bloomer, MD  meloxicam (MOBIC) 7.5 MG tablet Take 1 tablet (7.5 mg total) by mouth daily as needed (pain.). 10/03/20  Yes Bentlie Catanzaro, Ines Bloomer, MD  Multiple Vitamins-Minerals (MEGA MULTI MEN PO) Take 1 packet by mouth daily. GNC 50 PLUS    Yes [provider]  olopatadine (PATANOL) 0.1 % ophthalmic solution Place 1 drop into both eyes 2 (two) times daily as needed (irritated/dry eyes.).   Yes [provider]  sildenafil (VIAGRA) 100 MG tablet TAKE 1 TABLET DAILY AS NEEDED FOR ERECTILE DYSFUNCTION AT LEAST 1 HOUR PRIOR TO SEX. 07/24/20  Yes Jerry Pollen, MD  tamsulosin (FLOMAX) 0.4 MG CAPS capsule TAKE 1 CAPSULE DAILY AFTER SUPPER 12/01/20  Yes McKenzie, Candee Furbish, MD    No Known Allergies  Patient Active Problem List   Diagnosis Date Noted   Voice hoarseness 12/04/2020   Essential hypertension 07/24/2020   Environmental allergies 07/24/2020   Chronic cough 07/24/2020   Benign prostatic hyperplasia with urinary obstruction 11/14/2019   Urinary retention 10/05/2019   DDD (degenerative disc disease), lumbar 11/20/2013   Other allergic rhinitis 12/11/2011   Family history of colon cancer 12/11/2011   Cancer of thyroid ([place]) 12/11/2011   Hypothyroidism 08/16/2011   ED (erectile dysfunction) 08/16/2011    Past Medical History:  Diagnosis Date   Arthritis    Cancer ([place])    thyroid    Eczema    Hypertension  Hypothyroidism    Seasonal allergies     Past Surgical History:  Procedure Laterality Date   ADENOIDECTOMY     HERNIA REPAIR     13 yrs ago   [place] Right 09/28/2019   Procedure: REPAIR QUADRICEP TENDON;  Surgeon: Sydnee Cabal, MD;  Location: [facility];  Service: Orthopedics;  Laterality: Right;   rupture quad tenden     THYROIDECTOMY     4 or 5 yrs ago   TONSILLECTOMY      Social History   Socioeconomic History   Marital status: Married    Spouse name: Not on file   Number of children: 2   Years of  education: 16   Highest education level: Not on file  Occupational History   Occupation: rail roader  Tobacco Use   Smoking status: Former    Years: 18.00    Types: Cigarettes    Quit date: 12/15/1991    Years since quitting: 29.0   Smokeless tobacco: Never   Tobacco comments:    also smoked marijuana,and cocaine (free-base)   Vaping Use   Vaping Use: Never used  Substance and Sexual Activity   Alcohol use: Not Currently    Alcohol/week: 0.0 standard drinks    Comment: hx of 30 years ago    Drug use: Not Currently    Comment: lst 30 years ago    Sexual activity: Not on file  Other Topics Concern   Not on file  Social History Narrative   Exercise cardio and weights 4 days/wk for 1 hour   Social Determinants of Health   Financial Resource Strain: Not on file  Food Insecurity: Not on file  Transportation Needs: Not on file  Physical Activity: Not on file  Stress: Not on file  Social Connections: Not on file  Intimate Partner Violence: Not on file    Family History  Problem Relation Age of Onset   Cancer Mother        lung   Cancer Father        rectal   High Cholesterol Brother    Hypertension Brother    Stroke Brother    Kidney disease Maternal Grandmother        dialysis   Hypertension Maternal Grandmother    Hyperlipidemia Maternal Grandmother    Arthritis Maternal Grandfather    Allergic rhinitis Neg Hx    Asthma Neg Hx    Eczema Neg Hx    Urticaria Neg Hx      Review of Systems  Constitutional: Negative.  Negative for chills and fever.  HENT: Negative.  Negative for congestion and sore throat.   Respiratory: Negative.  Negative for cough and shortness of breath.   Cardiovascular: Negative.  Negative for chest pain and palpitations.  Gastrointestinal:  Negative for abdominal pain, diarrhea, nausea and vomiting.  Genitourinary: Negative.   Musculoskeletal:  Positive for joint pain.  Skin: Negative.  Negative for rash.  Neurological:  Negative for  dizziness and headaches.  Endo/Heme/Allergies:  Positive for environmental allergies.  All other systems reviewed and are negative.   Physical Exam Vitals reviewed.  Constitutional:      Appearance: Normal appearance.  HENT:     Head: Normocephalic.     Right Ear: Tympanic membrane, ear canal and external ear normal.     Left Ear: Tympanic membrane, ear canal and external ear normal.     Mouth/Throat:     Mouth: Mucous membranes are moist.     Pharynx: Oropharynx  is clear.  Eyes:     Extraocular Movements: Extraocular movements intact.     Conjunctiva/sclera: Conjunctivae normal.     Pupils: Pupils are equal, round, and reactive to light.  Cardiovascular:     Rate and Rhythm: Normal rate and regular rhythm.     Pulses: Normal pulses.     Heart sounds: Normal heart sounds.  Pulmonary:     Effort: Pulmonary effort is normal.     Breath sounds: Normal breath sounds.  Abdominal:     General: There is no distension.     Palpations: Abdomen is soft. There is no mass.     Tenderness: There is no abdominal tenderness.  Musculoskeletal:        General: Normal range of motion.     Cervical back: Normal range of motion and neck supple. No tenderness.     Right lower leg: No edema.     Left lower leg: No edema.  Lymphadenopathy:     Cervical: No cervical adenopathy.  Skin:    General: Skin is warm and dry.     Capillary Refill: Capillary refill takes less than 2 seconds.  Neurological:     General: No focal deficit present.     Mental Status: He is alert and oriented to person, place, and time.  Psychiatric:        Mood and Affect: Mood normal.        Behavior: Behavior normal.     ASSESSMENT & PLAN: Problem List Items Addressed This Visit       Cardiovascular and Mediastinum   Essential hypertension   Relevant Orders   Hemoglobin A1c   Comprehensive metabolic panel   Lipid panel     Endocrine   Hypothyroidism   Relevant Orders   TSH   Cancer of thyroid ([facility])      Genitourinary   Benign prostatic hyperplasia with urinary obstruction   Other Visit Diagnoses     Routine general medical examination at a health care facility    -  Primary   Need for influenza vaccination       Relevant Orders   Flu Vaccine QUAD High Dose(Fluad) (Completed)   Need for Tdap vaccination       Relevant Orders   Tdap vaccine greater than or equal to 7yo IM (Completed)      Modifiable risk factors discussed with patient. Anticipatory guidance according to age provided. The following topics were also discussed: Social Determinants of Health Smoking.  Non-smoker Diet and nutrition Benefits of exercise Cancer screening and review of colonoscopy report done in 2021 which showed polyps and diverticulosis. Vaccinations recommendations.  Up-to-date with shingles vaccine. Cardiovascular risk assessment The 10-year ASCVD risk score (Arnett DK, et al., 2019) is: 14.1%   Values used to calculate the score:     Age: 31 years     Sex: Male     Is Non-Hispanic African American: Yes     Diabetic: No     Tobacco smoker: No     Systolic Blood Pressure: 676 mmHg     Is BP treated: Yes     HDL Cholesterol: 77 mg/dL     Total Cholesterol: 202 mg/dL Hypertension management. Mental health including depression and anxiety Fall and accident prevention  Patient Instructions  Health Maintenance, Male Adopting a healthy lifestyle and getting preventive care are important in promoting health and wellness. Ask your health care provider about: The right schedule for you to have regular tests and exams. Things you  can do on your own to prevent diseases and keep yourself healthy. What should I know about diet, weight, and exercise? Eat a healthy diet  Eat a diet that includes plenty of vegetables, fruits, low-fat dairy products, and lean protein. Do not eat a lot of foods that are high in solid fats, added sugars, or sodium. Maintain a healthy weight Body mass index (BMI) is a  measurement that can be used to identify possible weight problems. It estimates body fat based on height and weight. Your health care provider can help determine your BMI and help you achieve or maintain a healthy weight. Get regular exercise Get regular exercise. This is one of the most important things you can do for your health. Most adults should: Exercise for at least 150 minutes each week. The exercise should increase your heart rate and make you sweat (moderate-intensity exercise). Do strengthening exercises at least twice a week. This is in addition to the moderate-intensity exercise. Spend less time sitting. Even light physical activity can be beneficial. Watch cholesterol and blood lipids Have your blood tested for lipids and cholesterol at 67 years of age, then have this test every 5 years. You may need to have your cholesterol levels checked more often if: Your lipid or cholesterol levels are high. You are older than 67 years of age. You are at high risk for heart disease. What should I know about cancer screening? Many types of cancers can be detected early and may often be prevented. Depending on your health history and family history, you may need to have cancer screening at various ages. This may include screening for: Colorectal cancer. Prostate cancer. Skin cancer. Lung cancer. What should I know about heart disease, diabetes, and high blood pressure? Blood pressure and heart disease High blood pressure causes heart disease and increases the risk of stroke. This is more likely to develop in people who have high blood pressure readings, are of African descent, or are overweight. Talk with your health care provider about your target blood pressure readings. Have your blood pressure checked: Every 3-5 years if you are 22-48 years of age. Every year if you are 41 years old or older. If you are between the ages of 25 and 34 and are a current or former smoker, ask your health  care provider if you should have a one-time screening for abdominal aortic aneurysm (AAA). Diabetes Have regular diabetes screenings. This checks your fasting blood sugar level. Have the screening done: Once every three years after age 62 if you are at a normal weight and have a low risk for diabetes. More often and at a younger age if you are overweight or have a high risk for diabetes. What should I know about preventing infection? Hepatitis B If you have a higher risk for hepatitis B, you should be screened for this virus. Talk with your health care provider to find out if you are at risk for hepatitis B infection. Hepatitis C Blood testing is recommended for: Everyone born from [birth year] through 1967. Anyone with known risk factors for hepatitis C. Sexually transmitted infections (STIs) You should be screened each year for STIs, including gonorrhea and chlamydia, if: You are sexually active and are younger than 67 years of age. You are older than 67 years of age and your health care provider tells you that you are at risk for this type of infection. Your sexual activity has changed since you were last screened, and you are at increased risk for  chlamydia or gonorrhea. Ask your health care provider if you are at risk. Ask your health care provider about whether you are at high risk for HIV. Your health care provider may recommend a prescription medicine to help prevent HIV infection. If you choose to take medicine to prevent HIV, you should first get tested for HIV. You should then be tested every 3 months for as long as you are taking the medicine. Follow these instructions at home: Lifestyle Do not use any products that contain nicotine or tobacco, such as cigarettes, e-cigarettes, and chewing tobacco. If you need help quitting, ask your health care provider. Do not use street drugs. Do not share needles. Ask your health care provider for help if you need support or information about quitting  drugs. Alcohol use Do not drink alcohol if your health care provider tells you not to drink. If you drink alcohol: Limit how much you have to 0-2 drinks a day. Be aware of how much alcohol is in your drink. In the [place] one drink equals one 12 oz bottle of beer (355 mL), one 5 oz glass of wine (148 mL), or one 1 oz glass of hard liquor (44 mL). General instructions Schedule regular health, dental, and eye exams. Stay current with your vaccines. Tell your health care provider if: You often feel depressed. You have ever been abused or do not feel safe at home. Summary Adopting a healthy lifestyle and getting preventive care are important in promoting health and wellness. Follow your health care provider's instructions about healthy diet, exercising, and getting tested or screened for diseases. Follow your health care provider's instructions on monitoring your cholesterol and blood pressure. This information is not intended to replace advice given to you by your health care provider. Make sure you discuss any questions you have with your health care provider. Document Revised: 05/09/2020 Document Reviewed: 02/22/2018 Elsevier Patient Education  2022 [place], MD [facility]

## 2021-01-12 NOTE — Patient Instructions (Signed)
Health Maintenance, Male Adopting a healthy lifestyle and getting preventive care are important in promoting health and wellness. Ask your health care provider about: The right schedule for you to have regular tests and exams. Things you can do on your own to prevent diseases and keep yourself healthy. What should I know about diet, weight, and exercise? Eat a healthy diet  Eat a diet that includes plenty of vegetables, fruits, low-fat dairy products, and lean protein. Do not eat a lot of foods that are high in solid fats, added sugars, or sodium. Maintain a healthy weight Body mass index (BMI) is a measurement that can be used to identify possible weight problems. It estimates body fat based on height and weight. Your health care provider can help determine your BMI and help you achieve or maintain a healthy weight. Get regular exercise Get regular exercise. This is one of the most important things you can do for your health. Most adults should: Exercise for at least 150 minutes each week. The exercise should increase your heart rate and make you sweat (moderate-intensity exercise). Do strengthening exercises at least twice a week. This is in addition to the moderate-intensity exercise. Spend less time sitting. Even light physical activity can be beneficial. Watch cholesterol and blood lipids Have your blood tested for lipids and cholesterol at 67 years of age, then have this test every 5 years. You may need to have your cholesterol levels checked more often if: Your lipid or cholesterol levels are high. You are older than 67 years of age. You are at high risk for heart disease. What should I know about cancer screening? Many types of cancers can be detected early and may often be prevented. Depending on your health history and family history, you may need to have cancer screening at various ages. This may include screening for: Colorectal cancer. Prostate cancer. Skin cancer. Lung  cancer. What should I know about heart disease, diabetes, and high blood pressure? Blood pressure and heart disease High blood pressure causes heart disease and increases the risk of stroke. This is more likely to develop in people who have high blood pressure readings, are of African descent, or are overweight. Talk with your health care provider about your target blood pressure readings. Have your blood pressure checked: Every 3-5 years if you are 18-39 years of age. Every year if you are 40 years old or older. If you are between the ages of 65 and 75 and are a current or former smoker, ask your health care provider if you should have a one-time screening for abdominal aortic aneurysm (AAA). Diabetes Have regular diabetes screenings. This checks your fasting blood sugar level. Have the screening done: Once every three years after age 45 if you are at a normal weight and have a low risk for diabetes. More often and at a younger age if you are overweight or have a high risk for diabetes. What should I know about preventing infection? Hepatitis B If you have a higher risk for hepatitis B, you should be screened for this virus. Talk with your health care provider to find out if you are at risk for hepatitis B infection. Hepatitis C Blood testing is recommended for: Everyone born from 1945 through 1965. Anyone with known risk factors for hepatitis C. Sexually transmitted infections (STIs) You should be screened each year for STIs, including gonorrhea and chlamydia, if: You are sexually active and are younger than 67 years of age. You are older than 67 years   of age and your health care provider tells you that you are at risk for this type of infection. Your sexual activity has changed since you were last screened, and you are at increased risk for chlamydia or gonorrhea. Ask your health care provider if you are at risk. Ask your health care provider about whether you are at high risk for HIV.  Your health care provider may recommend a prescription medicine to help prevent HIV infection. If you choose to take medicine to prevent HIV, you should first get tested for HIV. You should then be tested every 3 months for as long as you are taking the medicine. Follow these instructions at home: Lifestyle Do not use any products that contain nicotine or tobacco, such as cigarettes, e-cigarettes, and chewing tobacco. If you need help quitting, ask your health care provider. Do not use street drugs. Do not share needles. Ask your health care provider for help if you need support or information about quitting drugs. Alcohol use Do not drink alcohol if your health care provider tells you not to drink. If you drink alcohol: Limit how much you have to 0-2 drinks a day. Be aware of how much alcohol is in your drink. In the [place] one drink equals one 12 oz bottle of beer (355 mL), one 5 oz glass of wine (148 mL), or one 1 oz glass of hard liquor (44 mL). General instructions Schedule regular health, dental, and eye exams. Stay current with your vaccines. Tell your health care provider if: You often feel depressed. You have ever been abused or do not feel safe at home. Summary Adopting a healthy lifestyle and getting preventive care are important in promoting health and wellness. Follow your health care provider's instructions about healthy diet, exercising, and getting tested or screened for diseases. Follow your health care provider's instructions on monitoring your cholesterol and blood pressure. This information is not intended to replace advice given to you by your health care provider. Make sure you discuss any questions you have with your health care provider. Document Revised: 05/09/2020 Document Reviewed: 02/22/2018 Elsevier Patient Education  2022 [employer]  

## 2021-01-13 ENCOUNTER — Other Ambulatory Visit: Payer: Self-pay | Admitting: Emergency Medicine

## 2021-01-13 DIAGNOSIS — E890 Postprocedural hypothyroidism: Secondary | ICD-10-CM

## 2021-01-13 MED ORDER — LEVOTHYROXINE SODIUM 137 MCG PO TABS: 137.0000 ug | ORAL_TABLET | Freq: Every day | ORAL | 1 refills | Status: DC

## 2021-01-13 NOTE — Progress Notes (Signed)
OK.  Then we need to increase the dose of Synthroid.  I will send a new prescription to pharmacy of record today.  TSH needs to be repeated in 3 to 4 weeks.  Thanks.  Call patient please.

## 2021-01-13 NOTE — Progress Notes (Signed)
Elevated TSH. Will increase dose of Synthroid to 137 mcg daily.

## 2021-02-13 ENCOUNTER — Other Ambulatory Visit (INDEPENDENT_AMBULATORY_CARE_PROVIDER_SITE_OTHER): Payer: Medicare HMO

## 2021-02-13 ENCOUNTER — Other Ambulatory Visit: Payer: Self-pay

## 2021-02-13 DIAGNOSIS — E890 Postprocedural hypothyroidism: Secondary | ICD-10-CM | POA: Diagnosis not present

## 2021-02-13 LAB — TSH: TSH: 3.95 u[IU]/mL (ref 0.35–5.50)

## 2021-02-20 DIAGNOSIS — M2022 Hallux rigidus, left foot: Secondary | ICD-10-CM | POA: Diagnosis not present

## 2021-02-28 ENCOUNTER — Other Ambulatory Visit: Payer: Self-pay | Admitting: Emergency Medicine

## 2021-02-28 DIAGNOSIS — E890 Postprocedural hypothyroidism: Secondary | ICD-10-CM

## 2021-03-02 ENCOUNTER — Other Ambulatory Visit: Payer: Self-pay | Admitting: Emergency Medicine

## 2021-03-02 DIAGNOSIS — E890 Postprocedural hypothyroidism: Secondary | ICD-10-CM

## 2021-03-02 MED ORDER — LEVOTHYROXINE SODIUM 137 MCG PO TABS: 137.0000 ug | ORAL_TABLET | Freq: Every day | ORAL | 1 refills | Status: DC

## 2021-03-04 DIAGNOSIS — H25812 Combined forms of age-related cataract, left eye: Secondary | ICD-10-CM | POA: Diagnosis not present

## 2021-03-04 DIAGNOSIS — H2512 Age-related nuclear cataract, left eye: Secondary | ICD-10-CM | POA: Diagnosis not present

## 2021-03-17 DIAGNOSIS — M13831 Other specified arthritis, right wrist: Secondary | ICD-10-CM | POA: Diagnosis not present

## 2021-03-17 DIAGNOSIS — M25531 Pain in right wrist: Secondary | ICD-10-CM | POA: Diagnosis not present

## 2021-03-17 DIAGNOSIS — M13832 Other specified arthritis, left wrist: Secondary | ICD-10-CM | POA: Diagnosis not present

## 2021-03-17 DIAGNOSIS — M25532 Pain in left wrist: Secondary | ICD-10-CM | POA: Diagnosis not present

## 2021-03-25 ENCOUNTER — Other Ambulatory Visit: Payer: Self-pay | Admitting: Allergy

## 2021-04-23 ENCOUNTER — Other Ambulatory Visit: Payer: Self-pay | Admitting: *Deleted

## 2021-04-23 MED ORDER — FLUTICASONE PROPIONATE 50 MCG/ACT NA SUSP: 1.0000 | Freq: Two times a day (BID) | NASAL | 1 refills | Status: DC | PRN

## 2021-04-29 ENCOUNTER — Telehealth: Payer: Self-pay | Admitting: Emergency Medicine

## 2021-04-29 ENCOUNTER — Other Ambulatory Visit: Payer: Self-pay | Admitting: Emergency Medicine

## 2021-04-29 DIAGNOSIS — M5136 Other intervertebral disc degeneration, lumbar region: Secondary | ICD-10-CM

## 2021-04-29 MED ORDER — MELOXICAM 7.5 MG PO TABS: 7.5000 mg | ORAL_TABLET | Freq: Every day | ORAL | 1 refills | Status: DC | PRN

## 2021-04-29 NOTE — Telephone Encounter (Signed)
1.Medication Requested: meloxicam (MOBIC) 7.5 MG tablet  2. Pharmacy (Name, Street, [place]): [facility] [place] [facility] [place] [facility]  Phone:  [phone] Fax:  [phone]   3. On Med List: yes  4. Last Visit with PCP: 10.31.22  5. Next visit date with PCP: n/a   Agent: Please be advised that RX refills may take up to 3 business days. We ask that you follow-up with your pharmacy.

## 2021-04-29 NOTE — Telephone Encounter (Signed)
New prescription for meloxicam sent to pharmacy of record.

## 2021-05-16 ENCOUNTER — Encounter: Payer: Self-pay | Admitting: Emergency Medicine

## 2021-05-22 NOTE — Telephone Encounter (Signed)
Patient has OV 05/26/21 to discuss refills and cholesterol. ?

## 2021-05-26 ENCOUNTER — Other Ambulatory Visit: Payer: Self-pay

## 2021-05-26 ENCOUNTER — Ambulatory Visit (INDEPENDENT_AMBULATORY_CARE_PROVIDER_SITE_OTHER): Payer: Medicare HMO | Admitting: Emergency Medicine

## 2021-05-26 ENCOUNTER — Encounter: Payer: Self-pay | Admitting: Emergency Medicine

## 2021-05-26 VITALS — BP 122/82 | HR 60 | Ht 72.0 in | Wt 215.0 lb

## 2021-05-26 DIAGNOSIS — N401 Enlarged prostate with lower urinary tract symptoms: Secondary | ICD-10-CM

## 2021-05-26 DIAGNOSIS — I10 Essential (primary) hypertension: Secondary | ICD-10-CM

## 2021-05-26 DIAGNOSIS — N138 Other obstructive and reflux uropathy: Secondary | ICD-10-CM

## 2021-05-26 DIAGNOSIS — E890 Postprocedural hypothyroidism: Secondary | ICD-10-CM

## 2021-05-26 DIAGNOSIS — E785 Hyperlipidemia, unspecified: Secondary | ICD-10-CM | POA: Diagnosis not present

## 2021-05-26 DIAGNOSIS — N529 Male erectile dysfunction, unspecified: Secondary | ICD-10-CM | POA: Diagnosis not present

## 2021-05-26 LAB — COMPREHENSIVE METABOLIC PANEL
ALT: 32 U/L (ref 0–53)
AST: 38 U/L — ABNORMAL HIGH (ref 0–37)
Albumin: 4.1 g/dL (ref 3.5–5.2)
Alkaline Phosphatase: 52 U/L (ref 39–117)
BUN: 22 mg/dL (ref 6–23)
CO2: 29 mEq/L (ref 19–32)
Calcium: 9.1 mg/dL (ref 8.4–10.5)
Chloride: 105 mEq/L (ref 96–112)
Creatinine, Ser: 1.18 mg/dL (ref 0.40–1.50)
GFR: 63.64 mL/min (ref >60.00)
Glucose, Bld: 85 mg/dL (ref 70–99)
Potassium: 4.2 mEq/L (ref 3.5–5.1)
Sodium: 139 mEq/L (ref 135–145)
Total Bilirubin: 0.9 mg/dL (ref 0.2–1.2)
Total Protein: 6.5 g/dL (ref 6.0–8.3)

## 2021-05-26 LAB — LIPID PANEL
Cholesterol: 137 mg/dL (ref 0–200)
HDL: 76.8 mg/dL (ref >39.00)
LDL Cholesterol: 51 mg/dL (ref 0–99)
NonHDL: 59.8
Total CHOL/HDL Ratio: 2
Triglycerides: 45 mg/dL (ref 0.0–149.0)
VLDL: 9 mg/dL (ref 0.0–40.0)

## 2021-05-26 LAB — TSH: TSH: 1.69 u[IU]/mL (ref 0.35–5.50)

## 2021-05-26 MED ORDER — TAMSULOSIN HCL 0.4 MG PO CAPS: ORAL_CAPSULE | ORAL | 3 refills | Status: DC

## 2021-05-26 MED ORDER — ROSUVASTATIN CALCIUM 10 MG PO TABS: 10.0000 mg | ORAL_TABLET | Freq: Every day | ORAL | 3 refills | Status: DC

## 2021-05-26 MED ORDER — LEVOTHYROXINE SODIUM 137 MCG PO TABS: 137.0000 ug | ORAL_TABLET | Freq: Every day | ORAL | 1 refills | Status: DC

## 2021-05-26 MED ORDER — SILDENAFIL CITRATE 100 MG PO TABS: ORAL_TABLET | ORAL | 5 refills | Status: DC

## 2021-05-26 MED ORDER — AMLODIPINE BESYLATE 5 MG PO TABS: ORAL_TABLET | ORAL | 3 refills | Status: DC

## 2021-05-26 NOTE — Patient Instructions (Signed)
Health Maintenance After Age 68 After age 68, you are at a higher risk for certain long-term diseases and infections as well as injuries from falls. Falls are a major cause of broken bones and head injuries in people who are older than age 68. Getting regular preventive care can help to keep you healthy and well. Preventive care includes getting regular testing and making lifestyle changes as recommended by your health care provider. Talk with your health care provider about: Which screenings and tests you should have. A screening is a test that checks for a disease when you have no symptoms. A diet and exercise plan that is right for you. What should I know about screenings and tests to prevent falls? Screening and testing are the best ways to find a health problem early. Early diagnosis and treatment give you the best chance of managing medical conditions that are common after age 68. Certain conditions and lifestyle choices may make you more likely to have a fall. Your health care provider may recommend: Regular vision checks. Poor vision and conditions such as cataracts can make you more likely to have a fall. If you wear glasses, make sure to get your prescription updated if your vision changes. Medicine review. Work with your health care provider to regularly review all of the medicines you are taking, including over-the-counter medicines. Ask your health care provider about any side effects that may make you more likely to have a fall. Tell your health care provider if any medicines that you take make you feel dizzy or sleepy. Strength and balance checks. Your health care provider may recommend certain tests to check your strength and balance while standing, walking, or changing positions. Foot health exam. Foot pain and numbness, as well as not wearing proper footwear, can make you more likely to have a fall. Screenings, including: Osteoporosis screening. Osteoporosis is a condition that causes  the bones to get weaker and break more easily. Blood pressure screening. Blood pressure changes and medicines to control blood pressure can make you feel dizzy. Depression screening. You may be more likely to have a fall if you have a fear of falling, feel depressed, or feel unable to do activities that you used to do. Alcohol use screening. Using too much alcohol can affect your balance and may make you more likely to have a fall. Follow these instructions at home: Lifestyle Do not drink alcohol if: Your health care provider tells you not to drink. If you drink alcohol: Limit how much you have to: 0-1 drink a day for women. 0-2 drinks a day for men. Know how much alcohol is in your drink. In the [place] one drink equals one 12 oz bottle of beer (355 mL), one 5 oz glass of wine (148 mL), or one 1 oz glass of hard liquor (44 mL). Do not use any products that contain nicotine or tobacco. These products include cigarettes, chewing tobacco, and vaping devices, such as e-cigarettes. If you need help quitting, ask your health care provider. Activity  Follow a regular exercise program to stay fit. This will help you maintain your balance. Ask your health care provider what types of exercise are appropriate for you. If you need a cane or walker, use it as recommended by your health care provider. Wear supportive shoes that have nonskid soles. Safety  Remove any tripping hazards, such as rugs, cords, and clutter. Install safety equipment such as grab bars in bathrooms and safety rails on stairs. Keep rooms and walkways   well-lit. General instructions Talk with your health care provider about your risks for falling. Tell your health care provider if: You fall. Be sure to tell your health care provider about all falls, even ones that seem minor. You feel dizzy, tiredness (fatigue), or off-balance. Take over-the-counter and prescription medicines only as told by your health care provider. These include  supplements. Eat a healthy diet and maintain a healthy weight. A healthy diet includes low-fat dairy products, low-fat (lean) meats, and fiber from whole grains, beans, and lots of fruits and vegetables. Stay current with your vaccines. Schedule regular health, dental, and eye exams. Summary Having a healthy lifestyle and getting preventive care can help to protect your health and wellness after age 68. Screening and testing are the best way to find a health problem early and help you avoid having a fall. Early diagnosis and treatment give you the best chance for managing medical conditions that are more common for people who are older than age 68. Falls are a major cause of broken bones and head injuries in people who are older than age 68. Take precautions to prevent a fall at home. Work with your health care provider to learn what changes you can make to improve your health and wellness and to prevent falls. This information is not intended to replace advice given to you by your health care provider. Make sure you discuss any questions you have with your health care provider. Document Revised: 07/21/2020 Document Reviewed: 07/21/2020 Elsevier Patient Education  2022 [employer]  

## 2021-05-26 NOTE — Assessment & Plan Note (Signed)
Well-controlled hypertension. ?Cardiovascular risks associated with hypertension discussed. ?We will continue amlodipine 5 mg daily. ?Dietary approaches to stop hypertension discussed. ?

## 2021-05-26 NOTE — Assessment & Plan Note (Signed)
Clinically euthyroid.  Continue Synthroid 137 mcg daily.  TSH done today. ?

## 2021-05-26 NOTE — Progress Notes (Signed)
Jerry Freeman 66 y.o.   Chief Complaint  Patient presents with   Medication Refill    Pt would like to discuss stopping crestor and BP medication.  Med refill    HISTORY OF PRESENT ILLNESS: This is a 66 y.o. male with history of hypertension and dyslipidemia here for follow-up. Also needs medication refills. The 10-year ASCVD risk score (Arnett DK, et al., 2019) is: 23%   Values used to calculate the score:     Age: 68 years     Sex: Male     Is Non-Hispanic African American: Yes     Diabetic: No     Tobacco smoker: Yes     Systolic Blood Pressure: 122 mmHg     Is BP treated: Yes     HDL Cholesterol: 81.1 mg/dL     Total Cholesterol: 211 mg/dL   Medication Refill Pertinent negatives include no abdominal pain, chest pain, chills, congestion, coughing, fever, headaches, nausea, rash, sore throat or vomiting.    Prior to Admission medications   Medication Sig Start Date End Date Taking? Authorizing Provider  amLODipine (NORVASC) 5 MG tablet TAKE 1 TABLET DAILY 10/03/20  Yes Diora Bellizzi, Eilleen Kempf, MD  Ascorbic Acid (VITAMIN C) 1000 MG tablet Take 1,000 mg by mouth daily.   Yes [provider]  Boris Lown Oil 350 MG CAPS Take 350 mg by mouth daily.   Yes [provider]  levocetirizine (XYZAL) 5 MG tablet TAKE 1 TABLET EVERY EVENING 03/25/21  Yes Ellamae Sia, DO  levothyroxine (SYNTHROID) 137 MCG tablet Take 1 tablet (137 mcg total) by mouth daily before breakfast. 03/02/21 05/26/21 Yes Vallerie Hentz, Eilleen Kempf, MD  meloxicam (MOBIC) 7.5 MG tablet Take 1 tablet (7.5 mg total) by mouth daily as needed (pain.). 04/29/21  Yes Florentino Laabs, Eilleen Kempf, MD  Multiple Vitamins-Minerals (MEGA MULTI MEN PO) Take 1 packet by mouth daily. GNC 50 PLUS    Yes [provider]  olopatadine (PATANOL) 0.1 % ophthalmic solution Place 1 drop into both eyes 2 (two) times daily as needed (irritated/dry eyes.).   Yes [provider]  rosuvastatin (CRESTOR) 10 MG tablet Take 1 tablet  (10 mg total) by mouth daily. 01/12/21  Yes Jettson Crable, Eilleen Kempf, MD  sildenafil (VIAGRA) 100 MG tablet TAKE 1 TABLET DAILY AS NEEDED FOR ERECTILE DYSFUNCTION AT LEAST 1 HOUR PRIOR TO SEX. 07/24/20  Yes Georgina Quint, MD  tamsulosin (FLOMAX) 0.4 MG CAPS capsule TAKE 1 CAPSULE DAILY AFTER SUPPER 12/01/20  Yes McKenzie, Mardene Celeste, MD  Calcium-Phosphorus-Vitamin D (CITRACAL +D3 PO) Take 1 tablet by mouth daily.    [provider]    No Known Allergies  Patient Active Problem List   Diagnosis Date Noted   Voice hoarseness 12/04/2020   Essential hypertension 07/24/2020   Environmental allergies 07/24/2020   Chronic cough 07/24/2020   Benign prostatic hyperplasia with urinary obstruction 11/14/2019   Urinary retention 10/05/2019   DDD (degenerative disc disease), lumbar 11/20/2013   Other allergic rhinitis 12/11/2011   Family history of colon cancer 12/11/2011   Cancer of thyroid (HCC) 12/11/2011   Hypothyroidism 08/16/2011   ED (erectile dysfunction) 08/16/2011    Past Medical History:  Diagnosis Date   Arthritis    Cancer (HCC)    thyroid    Eczema    Hypertension    Hypothyroidism    Seasonal allergies     Past Surgical History:  Procedure Laterality Date   ADENOIDECTOMY     HERNIA REPAIR  13 yrs ago   QUADRICEPS TENDON REPAIR Right 09/28/2019   Procedure: REPAIR QUADRICEP TENDON;  Surgeon: Eugenia Mcalpine, MD;  Location: [facility];  Service: Orthopedics;  Laterality: Right;   rupture quad tenden     THYROIDECTOMY     4 or 5 yrs ago   TONSILLECTOMY      Social History   Socioeconomic History   Marital status: Married    Spouse name: Not on file   Number of children: 2   Years of education: 16   Highest education level: Not on file  Occupational History   Occupation: rail roader  Tobacco Use   Smoking status: Former    Years: 18.00    Types: Cigarettes    Quit date: 12/15/1991    Years since quitting: 29.4   Smokeless tobacco: Never    Tobacco comments:    also smoked marijuana,and cocaine (free-base)   Vaping Use   Vaping Use: Never used  Substance and Sexual Activity   Alcohol use: Not Currently    Alcohol/week: 0.0 standard drinks    Comment: hx of 30 years ago    Drug use: Not Currently    Comment: lst 30 years ago    Sexual activity: Not on file  Other Topics Concern   Not on file  Social History Narrative   Exercise cardio and weights 4 days/wk for 1 hour   Social Determinants of Health   Financial Resource Strain: Not on file  Food Insecurity: Not on file  Transportation Needs: Not on file  Physical Activity: Not on file  Stress: Not on file  Social Connections: Not on file  Intimate Partner Violence: Not on file    Family History  Problem Relation Age of Onset   Cancer Mother        lung   Cancer Father        rectal   High Cholesterol Brother    Hypertension Brother    Stroke Brother    Kidney disease Maternal Grandmother        dialysis   Hypertension Maternal Grandmother    Hyperlipidemia Maternal Grandmother    Arthritis Maternal Grandfather    Allergic rhinitis Neg Hx    Asthma Neg Hx    Eczema Neg Hx    Urticaria Neg Hx      Review of Systems  Constitutional: Negative.  Negative for chills and fever.  HENT: Negative.  Negative for congestion and sore throat.   Respiratory: Negative.  Negative for cough and shortness of breath.   Cardiovascular: Negative.  Negative for chest pain and palpitations.  Gastrointestinal: Negative.  Negative for abdominal pain, diarrhea, nausea and vomiting.  Genitourinary: Negative.   Skin: Negative.  Negative for rash.  Neurological: Negative.  Negative for dizziness and headaches.  All other systems reviewed and are negative.  Today's Vitals   05/26/21 1019  BP: 122/82  Pulse: 60  SpO2: 97%  Weight: 215 lb (97.5 kg)  Height: 6' (1.829 m)   Body mass index is 29.16 kg/m.  Physical Exam Vitals reviewed.  Constitutional:       Appearance: Normal appearance.  HENT:     Head: Normocephalic.  Eyes:     Extraocular Movements: Extraocular movements intact.     Pupils: Pupils are equal, round, and reactive to light.  Cardiovascular:     Rate and Rhythm: Normal rate and regular rhythm.     Pulses: Normal pulses.     Heart sounds: Normal heart sounds.  Pulmonary:  Effort: Pulmonary effort is normal.     Breath sounds: Normal breath sounds.  Abdominal:     Palpations: Abdomen is soft.     Tenderness: There is no abdominal tenderness.  Musculoskeletal:     Cervical back: No tenderness.     Right lower leg: No edema.     Left lower leg: No edema.  Lymphadenopathy:     Cervical: No cervical adenopathy.  Skin:    General: Skin is warm and dry.     Capillary Refill: Capillary refill takes less than 2 seconds.  Neurological:     General: No focal deficit present.     Mental Status: He is alert and oriented to person, place, and time.  Psychiatric:        Mood and Affect: Mood normal.        Behavior: Behavior normal.     ASSESSMENT & PLAN: A total of 48 minutes was spent with the patient and counseling/coordination of care regarding preparing for this visit, review of most recent office visit notes, review of multiple chronic medical conditions and their management, review of all medications, cardiovascular risks associated with hypertension and dyslipidemia, education on nutrition, review of most recent blood work results, prognosis, documentation and need for follow-up.  Problem List Items Addressed This Visit       Cardiovascular and Mediastinum   Essential hypertension - Primary    Well-controlled hypertension. Cardiovascular risks associated with hypertension discussed. We will continue amlodipine 5 mg daily. Dietary approaches to stop hypertension discussed.      Relevant Medications   sildenafil (VIAGRA) 100 MG tablet   amLODipine (NORVASC) 5 MG tablet   rosuvastatin (CRESTOR) 10 MG tablet    Other Relevant Orders   Comprehensive metabolic panel     Endocrine   Hypothyroidism    Clinically euthyroid.  Continue Synthroid 137 mcg daily.  TSH done today.      Relevant Medications   levothyroxine (SYNTHROID) 137 MCG tablet   Other Relevant Orders   TSH     Genitourinary   Benign prostatic hyperplasia with urinary obstruction    Stable.  Continue Flomax 0.4 mg daily.      Relevant Medications   tamsulosin (FLOMAX) 0.4 MG CAPS capsule     Other   ED (erectile dysfunction)   Relevant Medications   sildenafil (VIAGRA) 100 MG tablet   Dyslipidemia    Stable.  We will repeat lipid profile today. Cardiovascular risks associated with dyslipidemia discussed. We will continue rosuvastatin 10 mg daily. The 10-year ASCVD risk score (Arnett DK, et al., 2019) is: 23%   Values used to calculate the score:     Age: 7 years     Sex: Male     Is Non-Hispanic African American: Yes     Diabetic: No     Tobacco smoker: Yes     Systolic Blood Pressure: 122 mmHg     Is BP treated: Yes     HDL Cholesterol: 81.1 mg/dL     Total Cholesterol: 211 mg/dL       Relevant Medications   rosuvastatin (CRESTOR) 10 MG tablet   Other Relevant Orders   Lipid panel   Patient Instructions  Health Maintenance After Age 65 After age 75, you are at a higher risk for certain long-term diseases and infections as well as injuries from falls. Falls are a major cause of broken bones and head injuries in people who are older than age 38. Getting regular preventive care can help  to keep you healthy and well. Preventive care includes getting regular testing and making lifestyle changes as recommended by your health care provider. Talk with your health care provider about: Which screenings and tests you should have. A screening is a test that checks for a disease when you have no symptoms. A diet and exercise plan that is right for you. What should I know about screenings and tests to prevent  falls? Screening and testing are the best ways to find a health problem early. Early diagnosis and treatment give you the best chance of managing medical conditions that are common after age 41. Certain conditions and lifestyle choices may make you more likely to have a fall. Your health care provider may recommend: Regular vision checks. Poor vision and conditions such as cataracts can make you more likely to have a fall. If you wear glasses, make sure to get your prescription updated if your vision changes. Medicine review. Work with your health care provider to regularly review all of the medicines you are taking, including over-the-counter medicines. Ask your health care provider about any side effects that may make you more likely to have a fall. Tell your health care provider if any medicines that you take make you feel dizzy or sleepy. Strength and balance checks. Your health care provider may recommend certain tests to check your strength and balance while standing, walking, or changing positions. Foot health exam. Foot pain and numbness, as well as not wearing proper footwear, can make you more likely to have a fall. Screenings, including: Osteoporosis screening. Osteoporosis is a condition that causes the bones to get weaker and break more easily. Blood pressure screening. Blood pressure changes and medicines to control blood pressure can make you feel dizzy. Depression screening. You may be more likely to have a fall if you have a fear of falling, feel depressed, or feel unable to do activities that you used to do. Alcohol use screening. Using too much alcohol can affect your balance and may make you more likely to have a fall. Follow these instructions at home: Lifestyle Do not drink alcohol if: Your health care provider tells you not to drink. If you drink alcohol: Limit how much you have to: 0-1 drink a day for women. 0-2 drinks a day for men. Know how much alcohol is in your drink.  In the [place] one drink equals one 12 oz bottle of beer (355 mL), one 5 oz glass of wine (148 mL), or one 1 oz glass of hard liquor (44 mL). Do not use any products that contain nicotine or tobacco. These products include cigarettes, chewing tobacco, and vaping devices, such as e-cigarettes. If you need help quitting, ask your health care provider. Activity  Follow a regular exercise program to stay fit. This will help you maintain your balance. Ask your health care provider what types of exercise are appropriate for you. If you need a cane or walker, use it as recommended by your health care provider. Wear supportive shoes that have nonskid soles. Safety  Remove any tripping hazards, such as rugs, cords, and clutter. Install safety equipment such as grab bars in bathrooms and safety rails on stairs. Keep rooms and walkways well-lit. General instructions Talk with your health care provider about your risks for falling. Tell your health care provider if: You fall. Be sure to tell your health care provider about all falls, even ones that seem minor. You feel dizzy, tiredness (fatigue), or off-balance. Take over-the-counter and prescription medicines  only as told by your health care provider. These include supplements. Eat a healthy diet and maintain a healthy weight. A healthy diet includes low-fat dairy products, low-fat (lean) meats, and fiber from whole grains, beans, and lots of fruits and vegetables. Stay current with your vaccines. Schedule regular health, dental, and eye exams. Summary Having a healthy lifestyle and getting preventive care can help to protect your health and wellness after age 2. Screening and testing are the best way to find a health problem early and help you avoid having a fall. Early diagnosis and treatment give you the best chance for managing medical conditions that are more common for people who are older than age 11. Falls are a major cause of broken bones and  head injuries in people who are older than age 19. Take precautions to prevent a fall at home. Work with your health care provider to learn what changes you can make to improve your health and wellness and to prevent falls. This information is not intended to replace advice given to you by your health care provider. Make sure you discuss any questions you have with your health care provider. Document Revised: 07/21/2020 Document Reviewed: 07/21/2020 Elsevier Patient Education  2022 Elsevier Inc.     Edwina Barth, MD [facility]

## 2021-05-26 NOTE — Assessment & Plan Note (Signed)
Stable.  Continue Flomax 0.4 mg daily. ?

## 2021-05-26 NOTE — Assessment & Plan Note (Signed)
Stable.  We will repeat lipid profile today. ?Cardiovascular risks associated with dyslipidemia discussed. ?We will continue rosuvastatin 10 mg daily. ?The 10-year ASCVD risk score (Arnett DK, et al., 2019) is: 23% ?  Values used to calculate the score: ?    Age: 66 years ?    Sex: Male ?    Is Non-Hispanic African American: Yes ?    Diabetic: No ?    Tobacco smoker: Yes ?    Systolic Blood Pressure: 381 mmHg ?    Is BP treated: Yes ?    HDL Cholesterol: 81.1 mg/dL ?    Total Cholesterol: 211 mg/dL ? ?

## 2021-06-06 ENCOUNTER — Encounter: Payer: Self-pay | Admitting: Emergency Medicine

## 2021-06-08 ENCOUNTER — Other Ambulatory Visit: Payer: Self-pay | Admitting: Emergency Medicine

## 2021-06-08 DIAGNOSIS — M5136 Other intervertebral disc degeneration, lumbar region: Secondary | ICD-10-CM

## 2021-06-08 DIAGNOSIS — N529 Male erectile dysfunction, unspecified: Secondary | ICD-10-CM

## 2021-06-08 MED ORDER — SILDENAFIL CITRATE 100 MG PO TABS: ORAL_TABLET | ORAL | 5 refills | Status: DC

## 2021-06-08 NOTE — Telephone Encounter (Signed)
New prescription with request to dispense brand only sent to pharmacy of record.

## 2021-07-24 ENCOUNTER — Other Ambulatory Visit: Payer: Self-pay | Admitting: Emergency Medicine

## 2021-07-24 DIAGNOSIS — M5136 Other intervertebral disc degeneration, lumbar region: Secondary | ICD-10-CM

## 2021-08-09 ENCOUNTER — Other Ambulatory Visit: Payer: Self-pay | Admitting: Allergy

## 2021-08-11 ENCOUNTER — Other Ambulatory Visit: Payer: Self-pay | Admitting: Allergy

## 2021-08-12 MED ORDER — LEVOCETIRIZINE DIHYDROCHLORIDE 5 MG PO TABS: 5.0000 mg | ORAL_TABLET | Freq: Every evening | ORAL | 0 refills | Status: DC

## 2021-08-31 DIAGNOSIS — H40013 Open angle with borderline findings, low risk, bilateral: Secondary | ICD-10-CM | POA: Diagnosis not present

## 2021-09-15 ENCOUNTER — Other Ambulatory Visit: Payer: Self-pay | Admitting: Emergency Medicine

## 2021-09-15 DIAGNOSIS — M5136 Other intervertebral disc degeneration, lumbar region: Secondary | ICD-10-CM

## 2021-09-30 ENCOUNTER — Other Ambulatory Visit: Payer: Self-pay | Admitting: Allergy

## 2021-10-28 ENCOUNTER — Other Ambulatory Visit: Payer: Self-pay | Admitting: Allergy

## 2021-11-02 DIAGNOSIS — M13831 Other specified arthritis, right wrist: Secondary | ICD-10-CM | POA: Diagnosis not present

## 2021-11-02 DIAGNOSIS — M13832 Other specified arthritis, left wrist: Secondary | ICD-10-CM | POA: Diagnosis not present

## 2021-12-07 ENCOUNTER — Other Ambulatory Visit: Payer: Self-pay | Admitting: Emergency Medicine

## 2021-12-07 DIAGNOSIS — E890 Postprocedural hypothyroidism: Secondary | ICD-10-CM

## 2021-12-07 MED ORDER — LEVOTHYROXINE SODIUM 137 MCG PO TABS: 137.0000 ug | ORAL_TABLET | Freq: Every day | ORAL | 0 refills | Status: DC

## 2021-12-07 NOTE — Telephone Encounter (Signed)
Patient would like a short supply sent in to [id number]- [place], [place] - [address].

## 2021-12-22 ENCOUNTER — Encounter: Payer: Self-pay | Admitting: *Deleted

## 2021-12-22 ENCOUNTER — Telehealth: Payer: Self-pay | Admitting: *Deleted

## 2021-12-22 NOTE — Patient Outreach (Signed)
Care Coordination   Initial Visit Note   12/22/2021 Name: Jerry Freeman MRN: [id number] DOB: Apr 19, 1955  Jerry Freeman is a 66 y.o. year old male who sees Sagardia, Ines Bloomer, MD for primary care. I spoke with  Jerry Freeman by phone today.  What matters to the patients health and wellness today?  "I just still don't think I want to be on this medicine for high blood pressure and cholesterol-- I just still don't think I need it.... I am very active and eat right.... I've been taking the medicine like he told me to, but when I see him in November, I think I am going to insist that he let me come off of the medications and see how my numbers look when I am off of the medicine.  I also don't think I need to take the Vitamin D because I am in the sun all day long every day.  I plan to get my flu vaccine soon at my outpatient pharmacy"  Interventions provided; no further or ongoing care coordination needs identified today    Goals Addressed             This Visit's Progress    Care Coordination Activities: No follow up required   On track    Care Coordination Interventions: Evaluation of current treatment plan related to HTN/ HLD and patient's adherence to plan as established by provider Advised patient to provide appropriate vaccination information to provider or CM team member at next visit Advised patient to discuss his ongoing desire to not take antihypertensive, vitamin D, and statin medications- patient again states he believes he does not need these medications and would like to have an opportunity to get off medications and monitor to see if his blood pressure or cholesterol increases; I advised patient to begin preparing for his upcoming November PCP office visit by starting to monitor blood pressures at home several times per week and write on paper to take with him to office visit- he will consider; he tells me he is very active, in the sun "all day long every day," and has good control  of his blood pressure and cholesterol through his diet and his activity levels-- he does tell me that he is currently adherent to prescribed medications and is currently taking all of his medications: I encouraged him to continue taking all medications as prescribed until he has his scheduled office visit 01/18/22 and discusses with PCP and he verbalizes agreement   Reviewed medications with patient and discussed general adherence/ purpose of medications Provided patient with My-Chart educational materials related to HTN/ HLD Reviewed scheduled/upcoming provider appointments including 01/18/22- PCP Screening for signs and symptoms of depression related to chronic disease state  Assessed social determinant of health barriers Confirmed patient has plans to obtain flu vaccine for 2023-24 flu/ winter season "soon" through his outpatient pharmacy- this was encouraged          SDOH assessments and interventions completed:  Yes  SDOH Interventions Today    Flowsheet Row Most Recent Value  SDOH Interventions   Food Insecurity Interventions Intervention Not Indicated  Transportation Interventions Intervention Not Indicated  [drives self]       Care Coordination Interventions Activated:  Yes  Care Coordination Interventions:  Yes, provided   Follow up plan: No further intervention required.   Encounter Outcome:  Pt. Visit Completed   Oneta Rack, RN, BSN, CCRN Alumnus RN CM Care Coordination/ Transition of [facility] [phone]: direct  office

## 2021-12-22 NOTE — Patient Instructions (Signed)
Visit Information  Thank you for taking time to visit with me today. Please don't hesitate to contact me if I can be of assistance to you.   Following are the goals we discussed today:   Goals Addressed             This Visit's Progress    Care Coordination Activities: No follow up required   On track    Care Coordination Interventions: Evaluation of current treatment plan related to HTN/ HLD and patient's adherence to plan as established by provider Advised patient to provide appropriate vaccination information to provider or CM team member at next visit Advised patient to discuss his ongoing desire to not take antihypertensive, vitamin D, and statin medications- patient again states he believes he does not need these medications and would like to have an opportunity to get off medications and monitor to see if his blood pressure or cholesterol increases; I advised patient to begin preparing for his upcoming November PCP office visit by starting to monitor blood pressures at home several times per week and write on paper to take with him to office visit- he will consider; he tells me he is very active, in the sun "all day long every day," and has good control of his blood pressure and cholesterol through his diet and his activity levels-- he does tell me that he is currently adherent to prescribed medications and is currently taking all of his medications: I encouraged him to continue taking all medications as prescribed until he has his scheduled office visit 01/18/22 and discusses with PCP and he verbalizes agreement   Reviewed medications with patient and discussed general adherence/ purpose of medications Provided patient with My-Chart educational materials related to HTN/ HLD Reviewed scheduled/upcoming provider appointments including 01/18/22- PCP Screening for signs and symptoms of depression related to chronic disease state  Assessed social determinant of health barriers Confirmed  patient has plans to obtain flu vaccine for 2023-24 flu/ winter season "soon" through his outpatient pharmacy- this was encouraged          If you are experiencing a Mental Health or [place] or need someone to talk to, please  call the Suicide and Crisis Lifeline: 988 call the [place] National Suicide Prevention Lifeline: (878) 605-6845 or TTY: (475)374-6517 TTY 7474445404) to talk to a trained counselor call 1-800-273-TALK (toll free, 24 hour hotline) go to [facility] [address] [place] [phone]) call the [place]: [phone] call 911   Patient verbalizes understanding of instructions and care plan provided today and agrees to view in [place]. Active MyChart status and patient understanding of how to access instructions and care plan via MyChart confirmed with patient.     No further follow up required: no further or ongoing care coordination needs identified today  Oneta Rack, RN, BSN, CCRN Alumnus RN CM Care Coordination/ Transition of [place] [facility] [phone]: direct office

## 2022-01-16 ENCOUNTER — Other Ambulatory Visit: Payer: Self-pay | Admitting: Emergency Medicine

## 2022-01-16 DIAGNOSIS — M5136 Other intervertebral disc degeneration, lumbar region: Secondary | ICD-10-CM

## 2022-01-18 ENCOUNTER — Ambulatory Visit (INDEPENDENT_AMBULATORY_CARE_PROVIDER_SITE_OTHER): Payer: Medicare HMO

## 2022-01-18 ENCOUNTER — Ambulatory Visit (INDEPENDENT_AMBULATORY_CARE_PROVIDER_SITE_OTHER): Payer: Medicare HMO | Admitting: Emergency Medicine

## 2022-01-18 ENCOUNTER — Encounter: Payer: Self-pay | Admitting: Emergency Medicine

## 2022-01-18 VITALS — BP 126/70 | HR 56 | Temp 98.0°F | Ht 72.0 in | Wt 214.1 lb

## 2022-01-18 VITALS — BP 138/86 | HR 56 | Temp 98.0°F | Ht 72.0 in | Wt 214.1 lb

## 2022-01-18 DIAGNOSIS — Z13220 Encounter for screening for lipoid disorders: Secondary | ICD-10-CM | POA: Diagnosis not present

## 2022-01-18 DIAGNOSIS — Z125 Encounter for screening for malignant neoplasm of prostate: Secondary | ICD-10-CM | POA: Diagnosis not present

## 2022-01-18 DIAGNOSIS — Z9109 Other allergy status, other than to drugs and biological substances: Secondary | ICD-10-CM

## 2022-01-18 DIAGNOSIS — E785 Hyperlipidemia, unspecified: Secondary | ICD-10-CM | POA: Diagnosis not present

## 2022-01-18 DIAGNOSIS — Z1329 Encounter for screening for other suspected endocrine disorder: Secondary | ICD-10-CM

## 2022-01-18 DIAGNOSIS — N401 Enlarged prostate with lower urinary tract symptoms: Secondary | ICD-10-CM

## 2022-01-18 DIAGNOSIS — E890 Postprocedural hypothyroidism: Secondary | ICD-10-CM

## 2022-01-18 DIAGNOSIS — Z0000 Encounter for general adult medical examination without abnormal findings: Secondary | ICD-10-CM

## 2022-01-18 DIAGNOSIS — Z130 Encounter for screening for diseases of the blood and blood-forming organs and certain disorders involving the immune mechanism: Secondary | ICD-10-CM | POA: Diagnosis not present

## 2022-01-18 DIAGNOSIS — I10 Essential (primary) hypertension: Secondary | ICD-10-CM

## 2022-01-18 DIAGNOSIS — M5136 Other intervertebral disc degeneration, lumbar region: Secondary | ICD-10-CM | POA: Diagnosis not present

## 2022-01-18 DIAGNOSIS — C73 Malignant neoplasm of thyroid gland: Secondary | ICD-10-CM | POA: Diagnosis not present

## 2022-01-18 DIAGNOSIS — Z13228 Encounter for screening for other metabolic disorders: Secondary | ICD-10-CM

## 2022-01-18 DIAGNOSIS — N138 Other obstructive and reflux uropathy: Secondary | ICD-10-CM

## 2022-01-18 LAB — COMPREHENSIVE METABOLIC PANEL
ALT: 30 U/L (ref 0–53)
AST: 33 U/L (ref 0–37)
Albumin: 4.1 g/dL (ref 3.5–5.2)
Alkaline Phosphatase: 60 U/L (ref 39–117)
BUN: 17 mg/dL (ref 6–23)
CO2: 30 mEq/L (ref 19–32)
Calcium: 8.9 mg/dL (ref 8.4–10.5)
Chloride: 101 mEq/L (ref 96–112)
Creatinine, Ser: 1.15 mg/dL (ref 0.40–1.50)
GFR: 65.34 mL/min (ref >60.00)
Glucose, Bld: 80 mg/dL (ref 70–99)
Potassium: 4.3 mEq/L (ref 3.5–5.1)
Sodium: 137 mEq/L (ref 135–145)
Total Bilirubin: 0.7 mg/dL (ref 0.2–1.2)
Total Protein: 6.6 g/dL (ref 6.0–8.3)

## 2022-01-18 LAB — PSA: PSA: 1.47 ng/mL (ref 0.10–4.00)

## 2022-01-18 LAB — CBC WITH DIFFERENTIAL/PLATELET
Basophils Absolute: 0 10*3/uL (ref 0.0–0.1)
Basophils Relative: 1.1 % (ref 0.0–3.0)
Eosinophils Absolute: 0.5 10*3/uL (ref 0.0–0.7)
Eosinophils Relative: 12.6 % — ABNORMAL HIGH (ref 0.0–5.0)
HCT: 41.2 % (ref 39.0–52.0)
Hemoglobin: 13.5 g/dL (ref 13.0–17.0)
Lymphocytes Relative: 42.6 % (ref 12.0–46.0)
Lymphs Abs: 1.8 10*3/uL (ref 0.7–4.0)
MCHC: 32.8 g/dL (ref 30.0–36.0)
MCV: 83 fl (ref 78.0–100.0)
Monocytes Absolute: 0.4 10*3/uL (ref 0.1–1.0)
Monocytes Relative: 9.9 % (ref 3.0–12.0)
Neutro Abs: 1.4 10*3/uL (ref 1.4–7.7)
Neutrophils Relative %: 33.8 % — ABNORMAL LOW (ref 43.0–77.0)
Platelets: 171 10*3/uL (ref 150.0–400.0)
RBC: 4.96 Mil/uL (ref 4.22–5.81)
RDW: 15.2 % (ref 11.5–15.5)
WBC: 4.2 10*3/uL (ref 4.0–10.5)

## 2022-01-18 LAB — LIPID PANEL
Cholesterol: 137 mg/dL (ref 0–200)
HDL: 73.3 mg/dL (ref >39.00)
LDL Cholesterol: 53 mg/dL (ref 0–99)
NonHDL: 63.23
Total CHOL/HDL Ratio: 2
Triglycerides: 50 mg/dL (ref 0.0–149.0)
VLDL: 10 mg/dL (ref 0.0–40.0)

## 2022-01-18 LAB — TSH: TSH: 2.37 u[IU]/mL (ref 0.35–5.50)

## 2022-01-18 LAB — HEMOGLOBIN A1C: Hgb A1c MFr Bld: 5.8 % (ref 4.6–6.5)

## 2022-01-18 MED ORDER — LEVOCETIRIZINE DIHYDROCHLORIDE 5 MG PO TABS: 5.0000 mg | ORAL_TABLET | Freq: Every evening | ORAL | 3 refills | Status: DC

## 2022-01-18 NOTE — Progress Notes (Signed)
Jerry Freeman 66 y.o.   Chief Complaint  Patient presents with   Annual Exam    No concerns     HISTORY OF PRESENT ILLNESS: This is a 66 y.o. male A1A here for annual exam. Overall doing well.  Has no complaints or medical concerns today.  HPI   Prior to Admission medications   Medication Sig Start Date End Date Taking? Authorizing Provider  amLODipine (NORVASC) 5 MG tablet TAKE 1 TABLET DAILY 05/26/21  Yes Kearia Yin, Ines Bloomer, MD  Ascorbic Acid (VITAMIN C) 1000 MG tablet Take 1,000 mg by mouth daily.   Yes [provider]  Calcium-Phosphorus-Vitamin D (CITRACAL +D3 PO) Take 1 tablet by mouth daily.   Yes [provider]  Javier Docker Oil 350 MG CAPS Take 350 mg by mouth daily.   Yes [provider]  levocetirizine (XYZAL) 5 MG tablet Take 1 tablet (5 mg total) by mouth every evening. 08/12/21  Yes Garnet Sierras, DO  meloxicam (MOBIC) 7.5 MG tablet TAKE 1 TABLET DAILY AS     NEEDED FOR PAIN 01/18/22  Yes Nickolai Rinks, Ines Bloomer, MD  Multiple Vitamins-Minerals (MEGA MULTI MEN PO) Take 1 packet by mouth daily. [place] 50 PLUS    Yes [provider]  rosuvastatin (CRESTOR) 10 MG tablet Take 1 tablet (10 mg total) by mouth daily. 05/26/21  Yes Horald Pollen, MD  tamsulosin (FLOMAX) 0.4 MG CAPS capsule TAKE 1 CAPSULE DAILY AFTER SUPPER 05/26/21  Yes Tivon Lemoine, Ines Bloomer, MD  levothyroxine (SYNTHROID) 137 MCG tablet Take 1 tablet (137 mcg total) by mouth daily before breakfast. 12/07/21 01/06/22  Horald Pollen, MD  sildenafil (VIAGRA) 100 MG tablet TAKE 1 TABLET DAILY AS NEEDED FOR ERECTILE DYSFUNCTION AT LEAST 1 HOUR PRIOR TO SEX. 06/08/21   Horald Pollen, MD    No Known Allergies  Patient Active Problem List   Diagnosis Date Noted   Dyslipidemia 05/26/2021   Essential hypertension 07/24/2020   Environmental allergies 07/24/2020   Chronic cough 07/24/2020   Benign prostatic hyperplasia with urinary obstruction 11/14/2019   DDD (degenerative  disc disease), lumbar 11/20/2013   Family history of colon cancer 12/11/2011   Cancer of thyroid ([place]) 12/11/2011   Hypothyroidism 08/16/2011   ED (erectile dysfunction) 08/16/2011    Past Medical History:  Diagnosis Date   Arthritis    Cancer ([place])    thyroid    Eczema    Hypertension    Hypothyroidism    Seasonal allergies     Past Surgical History:  Procedure Laterality Date   ADENOIDECTOMY     HERNIA REPAIR     13 yrs ago   [place] Right 09/28/2019   Procedure: REPAIR QUADRICEP TENDON;  Surgeon: Sydnee Cabal, MD;  Location: [facility];  Service: Orthopedics;  Laterality: Right;   rupture quad tenden     THYROIDECTOMY     4 or 5 yrs ago   TONSILLECTOMY      Social History   Socioeconomic History   Marital status: Married    Spouse name: Not on file   Number of children: 2   Years of education: 16   Highest education level: Not on file  Occupational History   Occupation: rail roader  Tobacco Use   Smoking status: Former    Years: 18.00    Types: Cigarettes    Quit date: 12/15/1991    Years since quitting: 30.1   Smokeless tobacco: Never   Tobacco comments:    also smoked marijuana,and  cocaine (free-base)   Vaping Use   Vaping Use: Never used  Substance and Sexual Activity   Alcohol use: Not Currently    Alcohol/week: 0.0 standard drinks of alcohol    Comment: hx of 30 years ago    Drug use: Not Currently    Comment: lst 30 years ago    Sexual activity: Not on file  Other Topics Concern   Not on file  Social History Narrative   Exercise cardio and weights 4 days/wk for 1 hour   Social Determinants of Health   Financial Resource Strain: Not on file  Food Insecurity: No Food Insecurity (12/22/2021)   Hunger Vital Sign    Worried About Running Out of Food in the Last Year: Never true    Ran Out of Food in the Last Year: Never true  Transportation Needs: No Transportation Needs (12/22/2021)   PRAPARE - Radiographer, therapeutic (Medical): No    Lack of Transportation (Non-Medical): No  Physical Activity: Not on file  Stress: Not on file  Social Connections: Not on file  Intimate Partner Violence: Not on file    Family History  Problem Relation Age of Onset   Cancer Mother        lung   Cancer Father        rectal   High Cholesterol Brother    Hypertension Brother    Stroke Brother    Kidney disease Maternal Grandmother        dialysis   Hypertension Maternal Grandmother    Hyperlipidemia Maternal Grandmother    Arthritis Maternal Grandfather    Allergic rhinitis Neg Hx    Asthma Neg Hx    Eczema Neg Hx    Urticaria Neg Hx      Review of Systems  Constitutional: Negative.  Negative for chills and fever.  HENT: Negative.  Negative for congestion and sore throat.   Respiratory: Negative.  Negative for cough and shortness of breath.   Cardiovascular: Negative.  Negative for chest pain and palpitations.  Gastrointestinal:  Negative for abdominal pain, diarrhea, nausea and vomiting.  Genitourinary: Negative.   Skin: Negative.  Negative for rash.  Neurological: Negative.  Negative for dizziness and headaches.  All other systems reviewed and are negative.   Today's Vitals   01/18/22 1007  BP: 138/86  Pulse: (!) 56  Temp: 98 F (36.7 C)  TempSrc: Oral  SpO2: 98%  Weight: 214 lb 2 oz (97.1 kg)  Height: 6' (1.829 m)   Body mass index is 29.04 kg/m.  Physical Exam Vitals reviewed.  Constitutional:      Appearance: Normal appearance.  HENT:     Head: Normocephalic.     Right Ear: Tympanic membrane, ear canal and external ear normal.     Left Ear: Tympanic membrane, ear canal and external ear normal.     Mouth/Throat:     Mouth: Mucous membranes are moist.     Pharynx: Oropharynx is clear.  Eyes:     Extraocular Movements: Extraocular movements intact.     Conjunctiva/sclera: Conjunctivae normal.     Pupils: Pupils are equal, round, and reactive to light.   Cardiovascular:     Rate and Rhythm: Normal rate and regular rhythm.     Pulses: Normal pulses.     Heart sounds: Normal heart sounds.  Pulmonary:     Effort: Pulmonary effort is normal.     Breath sounds: Normal breath sounds.  Abdominal:  Palpations: Abdomen is soft.     Tenderness: There is no abdominal tenderness.  Musculoskeletal:     Cervical back: No tenderness.  Lymphadenopathy:     Cervical: No cervical adenopathy.  Skin:    General: Skin is warm and dry.  Neurological:     General: No focal deficit present.     Mental Status: He is alert and oriented to person, place, and time.  Psychiatric:        Mood and Affect: Mood normal.        Behavior: Behavior normal.      ASSESSMENT & PLAN: Problem List Items Addressed This Visit       Cardiovascular and Mediastinum   Essential hypertension    Well-controlled hypertension. Continue amlodipine 5 mg daily.      Relevant Orders   Comprehensive metabolic panel     Endocrine   Hypothyroidism    Secondary to thyroidectomy secondary to thyroid cancer. Stable.  TSH done today. Continue Synthroid 137 mcg daily.      Relevant Orders   TSH   Cancer of thyroid ([place])     Musculoskeletal and Integument   DDD (degenerative disc disease), lumbar     Genitourinary   Benign prostatic hyperplasia with urinary obstruction    Stable symptoms.  Continue Flomax 0.4 milligrams at bedtime        Other   Environmental allergies    Stable.  Continue Xyzal 5 mg tablet daily as needed      Dyslipidemia    Stable.  Diet and nutrition discussed.  Continue rosuvastatin 10 mg daily.      Other Visit Diagnoses     Routine general medical examination at a health care facility    -  Primary   Prostate cancer screening       Relevant Orders   PSA(Must document that pt has been informed of limitations of PSA testing.)   Screening for deficiency anemia       Relevant Orders   CBC with Differential   Screening for  lipoid disorders       Relevant Orders   Lipid panel   Screening for endocrine, metabolic and immunity disorder       Relevant Orders   Comprehensive metabolic panel   Hemoglobin A1c      Modifiable risk factors discussed with patient. Anticipatory guidance according to age provided. The following topics were also discussed: Social Determinants of Health Smoking.  Non-smoker Diet and nutrition.  Good eating habits Benefits of exercise.  Exercises regularly Cancer screening and review of most recent colonoscopy report Vaccinations reviewed and recommendations Cardiovascular risk assessment The 10-year ASCVD risk score (Arnett DK, et al., 2019) is: 13.8%   Values used to calculate the score:     Age: 54 years     Sex: Male     Is Non-Hispanic African American: Yes     Diabetic: No     Tobacco smoker: No     Systolic Blood Pressure: 211 mmHg     Is BP treated: Yes     HDL Cholesterol: 76.8 mg/dL     Total Cholesterol: 137 mg/dL Review of multiple chronic medical conditions and their management, Review of all medications. Mental health including depression and anxiety Fall and accident prevention   Patient Instructions  Health Maintenance, Male Adopting a healthy lifestyle and getting preventive care are important in promoting health and wellness. Ask your health care provider about: The right schedule for you to have regular tests  and exams. Things you can do on your own to prevent diseases and keep yourself healthy. What should I know about diet, weight, and exercise? Eat a healthy diet  Eat a diet that includes plenty of vegetables, fruits, low-fat dairy products, and lean protein. Do not eat a lot of foods that are high in solid fats, added sugars, or sodium. Maintain a healthy weight Body mass index (BMI) is a measurement that can be used to identify possible weight problems. It estimates body fat based on height and weight. Your health care provider can help  determine your BMI and help you achieve or maintain a healthy weight. Get regular exercise Get regular exercise. This is one of the most important things you can do for your health. Most adults should: Exercise for at least 150 minutes each week. The exercise should increase your heart rate and make you sweat (moderate-intensity exercise). Do strengthening exercises at least twice a week. This is in addition to the moderate-intensity exercise. Spend less time sitting. Even light physical activity can be beneficial. Watch cholesterol and blood lipids Have your blood tested for lipids and cholesterol at 68 years of age, then have this test every 5 years. You may need to have your cholesterol levels checked more often if: Your lipid or cholesterol levels are high. You are older than 68 years of age. You are at high risk for heart disease. What should I know about cancer screening? Many types of cancers can be detected early and may often be prevented. Depending on your health history and family history, you may need to have cancer screening at various ages. This may include screening for: Colorectal cancer. Prostate cancer. Skin cancer. Lung cancer. What should I know about heart disease, diabetes, and high blood pressure? Blood pressure and heart disease High blood pressure causes heart disease and increases the risk of stroke. This is more likely to develop in people who have high blood pressure readings or are overweight. Talk with your health care provider about your target blood pressure readings. Have your blood pressure checked: Every 3-5 years if you are 75-68 years of age. Every year if you are 54 years old or older. If you are between the ages of 51 and 21 and are a current or former smoker, ask your health care provider if you should have a one-time screening for abdominal aortic aneurysm (AAA). Diabetes Have regular diabetes screenings. This checks your fasting blood sugar level.  Have the screening done: Once every three years after age 60 if you are at a normal weight and have a low risk for diabetes. More often and at a younger age if you are overweight or have a high risk for diabetes. What should I know about preventing infection? Hepatitis B If you have a higher risk for hepatitis B, you should be screened for this virus. Talk with your health care provider to find out if you are at risk for hepatitis B infection. Hepatitis C Blood testing is recommended for: Everyone born from [birth year] through 1965. Anyone with known risk factors for hepatitis C. Sexually transmitted infections (STIs) You should be screened each year for STIs, including gonorrhea and chlamydia, if: You are sexually active and are younger than 68 years of age. You are older than 68 years of age and your health care provider tells you that you are at risk for this type of infection. Your sexual activity has changed since you were last screened, and you are at increased risk for  chlamydia or gonorrhea. Ask your health care provider if you are at risk. Ask your health care provider about whether you are at high risk for HIV. Your health care provider may recommend a prescription medicine to help prevent HIV infection. If you choose to take medicine to prevent HIV, you should first get tested for HIV. You should then be tested every 3 months for as long as you are taking the medicine. Follow these instructions at home: Alcohol use Do not drink alcohol if your health care provider tells you not to drink. If you drink alcohol: Limit how much you have to 0-2 drinks a day. Know how much alcohol is in your drink. In the [place] one drink equals one 12 oz bottle of beer (355 mL), one 5 oz glass of wine (148 mL), or one 1 oz glass of hard liquor (44 mL). Lifestyle Do not use any products that contain nicotine or tobacco. These products include cigarettes, chewing tobacco, and vaping devices, such as e-cigarettes.  If you need help quitting, ask your health care provider. Do not use street drugs. Do not share needles. Ask your health care provider for help if you need support or information about quitting drugs. General instructions Schedule regular health, dental, and eye exams. Stay current with your vaccines. Tell your health care provider if: You often feel depressed. You have ever been abused or do not feel safe at home. Summary Adopting a healthy lifestyle and getting preventive care are important in promoting health and wellness. Follow your health care provider's instructions about healthy diet, exercising, and getting tested or screened for diseases. Follow your health care provider's instructions on monitoring your cholesterol and blood pressure. This information is not intended to replace advice given to you by your health care provider. Make sure you discuss any questions you have with your health care provider. Document Revised: 07/21/2020 Document Reviewed: 07/21/2020 Elsevier Patient Education  [place], MD [facility]

## 2022-01-18 NOTE — Assessment & Plan Note (Signed)
Well-controlled hypertension. Continue amlodipine 5 mg daily. 

## 2022-01-18 NOTE — Patient Instructions (Signed)
Health Maintenance, Male Adopting a healthy lifestyle and getting preventive care are important in promoting health and wellness. Ask your health care provider about: The right schedule for you to have regular tests and exams. Things you can do on your own to prevent diseases and keep yourself healthy. What should I know about diet, weight, and exercise? Eat a healthy diet  Eat a diet that includes plenty of vegetables, fruits, low-fat dairy products, and lean protein. Do not eat a lot of foods that are high in solid fats, added sugars, or sodium. Maintain a healthy weight Body mass index (BMI) is a measurement that can be used to identify possible weight problems. It estimates body fat based on height and weight. Your health care provider can help determine your BMI and help you achieve or maintain a healthy weight. Get regular exercise Get regular exercise. This is one of the most important things you can do for your health. Most adults should: Exercise for at least 150 minutes each week. The exercise should increase your heart rate and make you sweat (moderate-intensity exercise). Do strengthening exercises at least twice a week. This is in addition to the moderate-intensity exercise. Spend less time sitting. Even light physical activity can be beneficial. Watch cholesterol and blood lipids Have your blood tested for lipids and cholesterol at 68 years of age, then have this test every 5 years. You may need to have your cholesterol levels checked more often if: Your lipid or cholesterol levels are high. You are older than 68 years of age. You are at high risk for heart disease. What should I know about cancer screening? Many types of cancers can be detected early and may often be prevented. Depending on your health history and family history, you may need to have cancer screening at various ages. This may include screening for: Colorectal cancer. Prostate cancer. Skin cancer. Lung  cancer. What should I know about heart disease, diabetes, and high blood pressure? Blood pressure and heart disease High blood pressure causes heart disease and increases the risk of stroke. This is more likely to develop in people who have high blood pressure readings or are overweight. Talk with your health care provider about your target blood pressure readings. Have your blood pressure checked: Every 3-5 years if you are 18-39 years of age. Every year if you are 40 years old or older. If you are between the ages of 65 and 75 and are a current or former smoker, ask your health care provider if you should have a one-time screening for abdominal aortic aneurysm (AAA). Diabetes Have regular diabetes screenings. This checks your fasting blood sugar level. Have the screening done: Once every three years after age 45 if you are at a normal weight and have a low risk for diabetes. More often and at a younger age if you are overweight or have a high risk for diabetes. What should I know about preventing infection? Hepatitis B If you have a higher risk for hepatitis B, you should be screened for this virus. Talk with your health care provider to find out if you are at risk for hepatitis B infection. Hepatitis C Blood testing is recommended for: Everyone born from 1945 through 1965. Anyone with known risk factors for hepatitis C. Sexually transmitted infections (STIs) You should be screened each year for STIs, including gonorrhea and chlamydia, if: You are sexually active and are younger than 68 years of age. You are older than 68 years of age and your   health care provider tells you that you are at risk for this type of infection. Your sexual activity has changed since you were last screened, and you are at increased risk for chlamydia or gonorrhea. Ask your health care provider if you are at risk. Ask your health care provider about whether you are at high risk for HIV. Your health care provider  may recommend a prescription medicine to help prevent HIV infection. If you choose to take medicine to prevent HIV, you should first get tested for HIV. You should then be tested every 3 months for as long as you are taking the medicine. Follow these instructions at home: Alcohol use Do not drink alcohol if your health care provider tells you not to drink. If you drink alcohol: Limit how much you have to 0-2 drinks a day. Know how much alcohol is in your drink. In the [place] one drink equals one 12 oz bottle of beer (355 mL), one 5 oz glass of wine (148 mL), or one 1 oz glass of hard liquor (44 mL). Lifestyle Do not use any products that contain nicotine or tobacco. These products include cigarettes, chewing tobacco, and vaping devices, such as e-cigarettes. If you need help quitting, ask your health care provider. Do not use street drugs. Do not share needles. Ask your health care provider for help if you need support or information about quitting drugs. General instructions Schedule regular health, dental, and eye exams. Stay current with your vaccines. Tell your health care provider if: You often feel depressed. You have ever been abused or do not feel safe at home. Summary Adopting a healthy lifestyle and getting preventive care are important in promoting health and wellness. Follow your health care provider's instructions about healthy diet, exercising, and getting tested or screened for diseases. Follow your health care provider's instructions on monitoring your cholesterol and blood pressure. This information is not intended to replace advice given to you by your health care provider. Make sure you discuss any questions you have with your health care provider. Document Revised: 07/21/2020 Document Reviewed: 07/21/2020 Elsevier Patient Education  2023 [employer]  

## 2022-01-18 NOTE — Assessment & Plan Note (Signed)
Stable.  Continue Xyzal 5 mg tablet daily as needed

## 2022-01-18 NOTE — Assessment & Plan Note (Signed)
Secondary to thyroidectomy secondary to thyroid cancer. Stable.  TSH done today. Continue Synthroid 137 mcg daily.

## 2022-01-18 NOTE — Assessment & Plan Note (Signed)
Stable.  Diet and nutrition discussed.  Continue rosuvastatin 10 mg daily.  

## 2022-01-18 NOTE — Progress Notes (Signed)
Subjective:   Jerry Freeman is a 66 y.o. male who presents for Medicare Annual/Subsequent preventive examination.  Review of Systems     Cardiac Risk Factors include: advanced age (>50mn, >>38women);dyslipidemia;hypertension;male gender;family history of premature cardiovascular disease     Objective:    Today's Vitals   01/18/22 1112  BP: 138/86  Pulse: (!) 56  Temp: 98 F (36.7 C)  SpO2: 98%  Weight: 214 lb 2 oz (97.1 kg)  Height: 6' (1.829 m)  PainSc: 0-No pain   Body mass index is 29.04 kg/m.     01/18/2022   11:49 AM 09/24/2019    2:11 PM  Advanced Directives  Does Patient Have a Medical Advance Directive? Yes No  Type of [place]   Copy of [place] Chart? No - copy requested     Current Medications (verified) Outpatient Encounter Medications as of 01/18/2022  Medication Sig   amLODipine (NORVASC) 5 MG tablet TAKE 1 TABLET DAILY   Ascorbic Acid (VITAMIN C) 1000 MG tablet Take 1,000 mg by mouth daily.   Calcium-Phosphorus-Vitamin D (CITRACAL +D3 PO) Take 1 tablet by mouth daily.   Krill Oil 350 MG CAPS Take 350 mg by mouth daily.   levocetirizine (XYZAL) 5 MG tablet Take 1 tablet (5 mg total) by mouth every evening.   levothyroxine (SYNTHROID) 137 MCG tablet Take 1 tablet (137 mcg total) by mouth daily before breakfast.   meloxicam (MOBIC) 7.5 MG tablet TAKE 1 TABLET DAILY AS     NEEDED FOR PAIN   Multiple Vitamins-Minerals (MEGA MULTI MEN PO) Take 1 packet by mouth daily. GNC 50 PLUS    rosuvastatin (CRESTOR) 10 MG tablet Take 1 tablet (10 mg total) by mouth daily.   sildenafil (VIAGRA) 100 MG tablet TAKE 1 TABLET DAILY AS NEEDED FOR ERECTILE DYSFUNCTION AT LEAST 1 HOUR PRIOR TO SEX.   tamsulosin (FLOMAX) 0.4 MG CAPS capsule TAKE 1 CAPSULE DAILY AFTER SUPPER   No facility-administered encounter medications on file as of 01/18/2022.    Allergies (verified) Patient has no known allergies.    History: Past Medical History:  Diagnosis Date   Arthritis    Cancer ([place]    thyroid    Eczema    Hypertension    Hypothyroidism    Seasonal allergies    Past Surgical History:  Procedure Laterality Date   ADENOIDECTOMY     HERNIA REPAIR     13 yrs ago   [place]   Procedure: REPAIR QUADRICEP TENDON;  Surgeon: CSydnee Cabal MD;  Location: [facility];  Service: Orthopedics;  Laterality: Right;   rupture quad tenden     THYROIDECTOMY     4 or 5 yrs ago   TONSILLECTOMY     Family History  Problem Relation Age of Onset   Cancer Mother        lung   Cancer Father        rectal   High Cholesterol Brother    Hypertension Brother    Stroke Brother    Kidney disease Maternal Grandmother        dialysis   Hypertension Maternal Grandmother    Hyperlipidemia Maternal Grandmother    Arthritis Maternal Grandfather    Allergic rhinitis Neg Hx    Asthma Neg Hx    Eczema Neg Hx    Urticaria Neg Hx    Social History   Socioeconomic History   Marital status: Married  Spouse name: Not on file   Number of children: 2   Years of education: 65   Highest education level: Not on file  Occupational History   Occupation: rail roader  Tobacco Use   Smoking status: Former    Years: 18.00    Types: Cigarettes    Quit date: 12/15/1991    Years since quitting: 30.1   Smokeless tobacco: Never   Tobacco comments:    also smoked marijuana,and cocaine (free-base)   Vaping Use   Vaping Use: Never used  Substance and Sexual Activity   Alcohol use: Not Currently    Alcohol/week: 0.0 standard drinks of alcohol    Comment: hx of 30 years ago    Drug use: Not Currently    Comment: lst 30 years ago    Sexual activity: Not on file  Other Topics Concern   Not on file  Social History Narrative   Exercise cardio and weights 4 days/wk for 1 hour   Social Determinants of Health   Financial Resource Strain: Low Risk  (01/18/2022)   Overall Financial  Resource Strain (CARDIA)    Difficulty of Paying Living Expenses: Not hard at all  Food Insecurity: No Food Insecurity (01/18/2022)   Hunger Vital Sign    Worried About Running Out of Food in the Last Year: Never true    Ran Out of Food in the Last Year: Never true  Transportation Needs: No Transportation Needs (01/18/2022)   PRAPARE - Hydrologist (Medical): No    Lack of Transportation (Non-Medical): No  Physical Activity: Sufficiently Active (01/18/2022)   Exercise Vital Sign    Days of Exercise per Week: 7 days    Minutes of Exercise per Session: 30 min  Stress: No Stress Concern Present (01/18/2022)   [place]    Feeling of Stress : Not at all  Social Connections: [place] (01/18/2022)   Social Connection and Isolation Panel [NHANES]    Frequency of Communication with Friends and Family: More than three times a week    Frequency of Social Gatherings with Friends and Family: More than three times a week    Attends Religious Services: More than 4 times per year    Active Member of [employer] or Organizations: Yes    Attends Music therapist: More than 4 times per year    Marital Status: Married    Tobacco Counseling Counseling given: Not Answered Tobacco comments: also smoked marijuana,and cocaine (free-base)    Clinical Intake:  Pre-visit preparation completed: Yes  Pain : No/denies pain Pain Score: 0-No pain     BMI - recorded: 29.04 Nutritional Status: BMI 25 -29 Overweight Nutritional Risks: None Diabetes: No  How often do you need to have someone help you when you read instructions, pamphlets, or other written materials from your doctor or pharmacy?: 1 - Never What is the last grade level you completed in school?: HSG  Diabetic? no  Interpreter Needed?: No  Information entered by :: Lisette Abu, LPN.   Activities of Daily Living     01/18/2022   11:50 AM  In your present state of health, do you have any difficulty performing the following activities:  Hearing? 0  Vision? 0  Difficulty concentrating or making decisions? 0  Walking or climbing stairs? 0  Dressing or bathing? 0  Doing errands, shopping? 0  Preparing Food and eating ? N  Using the Toilet? N  In  the past six months, have you accidently leaked urine? N  Do you have problems with loss of bowel control? N  Managing your Medications? N  Managing your Finances? N  Housekeeping or managing your Housekeeping? N    Patient Care Team: Horald Pollen, MD as PCP - General (Internal Medicine)  Indicate any recent Medical Services you may have received from other than [facility] providers in the past year (date may be approximate).     Assessment:   This is a routine wellness examination for Donel.  Hearing/Vision screen Hearing Screening - Comments:: Patient has no hearing difficulties. Vision Screening - Comments:: No rx eyeglasses - up to date with routine eye exams with [facility] [place], OD.   Dietary issues and exercise activities discussed: Current Exercise Habits: Home exercise routine;Structured exercise class, Type of exercise: walking;Other - see comments (golf), Time (Minutes): 30, Frequency (Times/Week): 7, Weekly Exercise (Minutes/Week): 210   Goals Addressed             This Visit's Progress    My goal is to continue to stay physically fit.        Depression Screen    01/18/2022   10:12 AM 12/22/2021    2:41 PM 01/12/2021    8:14 AM 01/03/2020    9:06 AM 12/18/2018    9:18 AM 01/26/2018    4:18 PM 07/22/2017   10:53 AM  PHQ 2/9 Scores  PHQ - 2 Score 0 0 0 0 0 0 0    Fall Risk    01/18/2022   11:19 AM 01/18/2022   10:12 AM 01/12/2021    8:13 AM 01/03/2020    9:07 AM 01/03/2020    9:06 AM  Fall Risk   Falls in the past year? 0 0 0 0 0  Number falls in past yr: 0 0 0 0 0  Injury with Fall? 0 0 0 0 0  Risk for fall due to :  No Fall Risks No Fall Risks     Follow up Falls prevention discussed Falls evaluation completed   Falls evaluation completed    [place]:  Any stairs in or around the home? Yes  If so, are there any without handrails? No  Home free of loose throw rugs in walkways, pet beds, electrical cords, etc? Yes  Adequate lighting in your home to reduce risk of falls? Yes   ASSISTIVE DEVICES UTILIZED TO PREVENT FALLS:  Life alert? No  Use of a cane, walker or w/c? No  Grab bars in the bathroom? No  Shower chair or bench in shower? No  Elevated toilet seat or a handicapped toilet? No   TIMED UP AND GO:  Was the test performed? Yes .  Length of time to ambulate 10 feet: 6 sec.   Gait steady and fast without use of assistive device  Cognitive Function:        01/18/2022   11:51 AM 01/03/2020    9:07 AM  6CIT Screen  What Year? 0 points 0 points  What month? 0 points 0 points  What time? 0 points 0 points  Count back from 20 0 points 0 points  Months in reverse 0 points 0 points  Repeat phrase 0 points 0 points  Total Score 0 points 0 points    Immunizations Immunization History  Administered Date(s) Administered   Fluad Quad(high Dose 65+) 12/18/2018, 01/03/2020, 01/12/2021, 12/23/2021   Influenza Split 12/08/2011, 12/29/2021   Influenza,inj,Quad PF,6+ Mos 03/13/2013,  03/13/2014, 11/19/2014, 12/23/2015, 01/20/2017, 01/26/2018   Influenza,inj,quad, With Preservative 12/27/2018   PFIZER(Purple Top)SARS-COV-2 Vaccination 05/18/2019, 06/20/2019, 12/29/2021   Pneumococcal Conjugate-13 03/13/2014   Pneumococcal Polysaccharide-23 12/18/2018   Respiratory Syncytial Virus Vaccine,Recomb Aduvanted(Arexvy) 12/29/2021   Tdap 11/03/2009, 01/12/2021   Zoster Recombinat (Shingrix) 01/08/2020, 03/11/2020    TDAP status: Up to date  Flu Vaccine status: Up to date  Pneumococcal vaccine status: Up to date  Covid-19 vaccine status: Completed  vaccines  Qualifies for Shingles Vaccine? Yes   Zostavax completed No   Shingrix Completed?: Yes  Screening Tests Health Maintenance  Topic Date Due   COVID-19 Vaccine (4 - Pfizer risk series) 02/23/2022   Medicare Annual Wellness (AWV)  01/19/2023   COLONOSCOPY (Pts 45-53yr Insurance coverage will need to be confirmed)  05/30/2024   TETANUS/TDAP  01/13/2031   Pneumonia Vaccine 68 Years old  Completed   INFLUENZA VACCINE  Completed   Hepatitis C Screening  Completed   Zoster Vaccines- Shingrix  Completed   HPV VACCINES  Aged Out    Health Maintenance  There are no preventive care reminders to display for this patient.   Colorectal cancer screening: Type of screening: Colonoscopy. Completed 05/31/2019. Repeat every 5 years  Lung Cancer Screening: (Low Dose CT Chest recommended if Age 68-80years, 30 pack-year currently smoking OR have quit w/in 15years.) does not qualify.   Lung Cancer Screening Referral: no  Additional Screening:  Hepatitis C Screening: does qualify; Completed 11/20/2013  Vision Screening: Recommended annual ophthalmology exams for early detection of glaucoma and other disorders of the eye. Is the patient up to date with their annual eye exam?  Yes  Who is the provider or what is the name of the office in which the patient attends annual eye exams? Heather [place] OD. If pt is not established with a provider, would they like to be referred to a provider to establish care? No .   Dental Screening: Recommended annual dental exams for proper oral hygiene  Community Resource Referral / Chronic Care Management: CRR required this visit?  No   CCM required this visit?  No      Plan:     I have personally reviewed and noted the following in the patient's chart:   Medical and social history Use of alcohol, tobacco or illicit drugs  Current medications and supplements including opioid prescriptions. Patient is not currently taking opioid  prescriptions. Functional ability and status Nutritional status Physical activity Advanced directives List of other physicians Hospitalizations, surgeries, and ER visits in previous 12 months Vitals Screenings to include cognitive, depression, and falls Referrals and appointments  In addition, I have reviewed and discussed with patient certain preventive protocols, quality metrics, and best practice recommendations. A written personalized care plan for preventive services as well as general preventive health recommendations were provided to patient.     SSheral Flow LPN   172/08/2033  Nurse Notes: N/A

## 2022-01-18 NOTE — Patient Instructions (Signed)
Jerry Freeman , Thank you for taking time to come for your Medicare Wellness Visit. I appreciate your ongoing commitment to your health goals. Please review the following plan we discussed and let me know if I can assist you in the future.   These are the goals we discussed:  Goals      My goal is to continue to stay physically fit.        This is a list of the screening recommended for you and due dates:  Health Maintenance  Topic Date Due   COVID-19 Vaccine (4 - Pfizer risk series) 02/23/2022   Medicare Annual Wellness Visit  01/19/2023   Colon Cancer Screening  05/30/2024   Tetanus Vaccine  01/13/2031   Pneumonia Vaccine  Completed   Flu Shot  Completed   Hepatitis C Screening: USPSTF Recommendation to screen - Ages 18-79 yo.  Completed   Zoster (Shingles) Vaccine  Completed   HPV Vaccine  Aged Out    Advanced directives: Yes  Conditions/risks identified: Yes  Next appointment: Follow up in one year for your annual wellness visit.   Preventive Care 68 Years and Older, Male  Preventive care refers to lifestyle choices and visits with your health care provider that can promote health and wellness. What does preventive care include? A yearly physical exam. This is also called an annual well check. Dental exams once or twice a year. Routine eye exams. Ask your health care provider how often you should have your eyes checked. Personal lifestyle choices, including: Daily care of your teeth and gums. Regular physical activity. Eating a healthy diet. Avoiding tobacco and drug use. Limiting alcohol use. Practicing safe sex. Taking low doses of aspirin every day. Taking vitamin and mineral supplements as recommended by your health care provider. What happens during an annual well check? The services and screenings done by your health care provider during your annual well check will depend on your age, overall health, lifestyle risk factors, and family history of  disease. Counseling  Your health care provider may ask you questions about your: Alcohol use. Tobacco use. Drug use. Emotional well-being. Home and relationship well-being. Sexual activity. Eating habits. History of falls. Memory and ability to understand (cognition). Work and work Statistician. Screening  You may have the following tests or measurements: Height, weight, and BMI. Blood pressure. Lipid and cholesterol levels. These may be checked every 5 years, or more frequently if you are over 1 years old. Skin check. Lung cancer screening. You may have this screening every year starting at age 74 if you have a 30-pack-year history of smoking and currently smoke or have quit within the past 15 years. Fecal occult blood test (FOBT) of the stool. You may have this test every year starting at age 40. Flexible sigmoidoscopy or colonoscopy. You may have a sigmoidoscopy every 5 years or a colonoscopy every 10 years starting at age 51. Prostate cancer screening. Recommendations will vary depending on your family history and other risks. Hepatitis C blood test. Hepatitis B blood test. Sexually transmitted disease (STD) testing. Diabetes screening. This is done by checking your blood sugar (glucose) after you have not eaten for a while (fasting). You may have this done every 1-3 years. Abdominal aortic aneurysm (AAA) screening. You may need this if you are a current or former smoker. Osteoporosis. You may be screened starting at age 86 if you are at high risk. Talk with your health care provider about your test results, treatment options, and if necessary,  the need for more tests. Vaccines  Your health care provider may recommend certain vaccines, such as: Influenza vaccine. This is recommended every year. Tetanus, diphtheria, and acellular pertussis (Tdap, Td) vaccine. You may need a Td booster every 10 years. Zoster vaccine. You may need this after age 80. Pneumococcal 13-valent  conjugate (PCV13) vaccine. One dose is recommended after age 3. Pneumococcal polysaccharide (PPSV23) vaccine. One dose is recommended after age 36. Talk to your health care provider about which screenings and vaccines you need and how often you need them. This information is not intended to replace advice given to you by your health care provider. Make sure you discuss any questions you have with your health care provider. Document Released: 03/28/2015 Document Revised: 11/19/2015 Document Reviewed: 12/31/2014 Elsevier Interactive Patient Education  2017 [place] Prevention in the Home Falls can cause injuries. They can happen to people of all ages. There are many things you can do to make your home safe and to help prevent falls. What can I do on the outside of my home? Regularly fix the edges of walkways and driveways and fix any cracks. Remove anything that might make you trip as you walk through a door, such as a raised step or threshold. Trim any bushes or trees on the path to your home. Use bright outdoor lighting. Clear any walking paths of anything that might make someone trip, such as rocks or tools. Regularly check to see if handrails are loose or broken. Make sure that both sides of any steps have handrails. Any raised decks and porches should have guardrails on the edges. Have any leaves, snow, or ice cleared regularly. Use sand or salt on walking paths during winter. Clean up any spills in your garage right away. This includes oil or grease spills. What can I do in the bathroom? Use night lights. Install grab bars by the toilet and in the tub and shower. Do not use towel bars as grab bars. Use non-skid mats or decals in the tub or shower. If you need to sit down in the shower, use a plastic, non-slip stool. Keep the floor dry. Clean up any water that spills on the floor as soon as it happens. Remove soap buildup in the tub or shower regularly. Attach bath mats  securely with double-sided non-slip rug tape. Do not have throw rugs and other things on the floor that can make you trip. What can I do in the bedroom? Use night lights. Make sure that you have a light by your bed that is easy to reach. Do not use any sheets or blankets that are too big for your bed. They should not hang down onto the floor. Have a firm chair that has side arms. You can use this for support while you get dressed. Do not have throw rugs and other things on the floor that can make you trip. What can I do in the kitchen? Clean up any spills right away. Avoid walking on wet floors. Keep items that you use a lot in easy-to-reach places. If you need to reach something above you, use a strong step stool that has a grab bar. Keep electrical cords out of the way. Do not use floor polish or wax that makes floors slippery. If you must use wax, use non-skid floor wax. Do not have throw rugs and other things on the floor that can make you trip. What can I do with my stairs? Do not leave any items on  the stairs. Make sure that there are handrails on both sides of the stairs and use them. Fix handrails that are broken or loose. Make sure that handrails are as long as the stairways. Check any carpeting to make sure that it is firmly attached to the stairs. Fix any carpet that is loose or worn. Avoid having throw rugs at the top or bottom of the stairs. If you do have throw rugs, attach them to the floor with carpet tape. Make sure that you have a light switch at the top of the stairs and the bottom of the stairs. If you do not have them, ask someone to add them for you. What else can I do to help prevent falls? Wear shoes that: Do not have high heels. Have rubber bottoms. Are comfortable and fit you well. Are closed at the toe. Do not wear sandals. If you use a stepladder: Make sure that it is fully opened. Do not climb a closed stepladder. Make sure that both sides of the stepladder  are locked into place. Ask someone to hold it for you, if possible. Clearly mark and make sure that you can see: Any grab bars or handrails. First and last steps. Where the edge of each step is. Use tools that help you move around (mobility aids) if they are needed. These include: Canes. Walkers. Scooters. Crutches. Turn on the lights when you go into a dark area. Replace any light bulbs as soon as they burn out. Set up your furniture so you have a clear path. Avoid moving your furniture around. If any of your floors are uneven, fix them. If there are any pets around you, be aware of where they are. Review your medicines with your doctor. Some medicines can make you feel dizzy. This can increase your chance of falling. Ask your doctor what other things that you can do to help prevent falls. This information is not intended to replace advice given to you by your health care provider. Make sure you discuss any questions you have with your health care provider. Document Released: 12/26/2008 Document Revised: 08/07/2015 Document Reviewed: 04/05/2014 Elsevier Interactive Patient Education  2017 [employer].

## 2022-01-18 NOTE — Assessment & Plan Note (Signed)
Stable symptoms.  Continue Flomax 0.4 milligrams at bedtime

## 2022-02-08 ENCOUNTER — Other Ambulatory Visit: Payer: Self-pay | Admitting: Emergency Medicine

## 2022-02-08 DIAGNOSIS — M5136 Other intervertebral disc degeneration, lumbar region: Secondary | ICD-10-CM

## 2022-02-12 DIAGNOSIS — M25561 Pain in right knee: Secondary | ICD-10-CM | POA: Diagnosis not present

## 2022-02-25 DIAGNOSIS — M2022 Hallux rigidus, left foot: Secondary | ICD-10-CM | POA: Diagnosis not present

## 2022-03-02 DIAGNOSIS — M79675 Pain in left toe(s): Secondary | ICD-10-CM | POA: Diagnosis not present

## 2022-03-02 DIAGNOSIS — M25572 Pain in left ankle and joints of left foot: Secondary | ICD-10-CM | POA: Diagnosis not present

## 2022-03-09 DIAGNOSIS — M2022 Hallux rigidus, left foot: Secondary | ICD-10-CM | POA: Diagnosis not present

## 2022-03-31 ENCOUNTER — Other Ambulatory Visit: Payer: Self-pay | Admitting: Emergency Medicine

## 2022-03-31 DIAGNOSIS — E890 Postprocedural hypothyroidism: Secondary | ICD-10-CM

## 2022-05-07 DIAGNOSIS — M2022 Hallux rigidus, left foot: Secondary | ICD-10-CM | POA: Diagnosis not present

## 2022-05-08 ENCOUNTER — Other Ambulatory Visit: Payer: Self-pay | Admitting: Emergency Medicine

## 2022-05-08 DIAGNOSIS — N401 Enlarged prostate with lower urinary tract symptoms: Secondary | ICD-10-CM

## 2022-05-11 ENCOUNTER — Telehealth: Payer: Self-pay | Admitting: Emergency Medicine

## 2022-05-11 NOTE — Telephone Encounter (Signed)
Pre op form received by CMA, placed in provider box for signature

## 2022-05-11 NOTE — Telephone Encounter (Signed)
For our records:  We have received Pre-Op PW for the pt and it has been placed in Dr. Barry Brunner boxes.   Upon completion please fax to:  [phone]

## 2022-05-13 ENCOUNTER — Encounter: Payer: Self-pay | Admitting: Radiology

## 2022-05-13 NOTE — Telephone Encounter (Signed)
Pre-op form faxed to ortho office , confirmation fax recieved

## 2022-05-17 NOTE — H&P (Signed)
ORTHOPAEDIC SURGERY H&P  Subjective:  The patient presents with left great toe MTP OA (hallux rigidus).   Past Medical History:  Diagnosis Date   Arthritis    Cancer ([place])    thyroid    Eczema    Hypertension    Hypothyroidism    Seasonal allergies     Past Surgical History:  Procedure Laterality Date   ADENOIDECTOMY     HERNIA REPAIR     13 yrs ago   [place] Right 09/28/2019   Procedure: REPAIR QUADRICEP TENDON;  Surgeon: Sydnee Cabal, MD;  Location: [facility];  Service: Orthopedics;  Laterality: Right;   rupture quad tenden     THYROIDECTOMY     4 or 5 yrs ago   TONSILLECTOMY       (Not in an outpatient encounter)    No Known Allergies  Social History   Socioeconomic History   Marital status: Married    Spouse name: Not on file   Number of children: 2   Years of education: 16   Highest education level: Not on file  Occupational History   Occupation: rail roader  Tobacco Use   Smoking status: Former    Years: 18.00    Types: Cigarettes    Quit date: 12/15/1991    Years since quitting: 30.4   Smokeless tobacco: Never   Tobacco comments:    also smoked marijuana,and cocaine (free-base)   Vaping Use   Vaping Use: Never used  Substance and Sexual Activity   Alcohol use: Not Currently    Alcohol/week: 0.0 standard drinks of alcohol    Comment: hx of 30 years ago    Drug use: Not Currently    Comment: lst 30 years ago    Sexual activity: Not on file  Other Topics Concern   Not on file  Social History Narrative   Exercise cardio and weights 4 days/wk for 1 hour   Social Determinants of Health   Financial Resource Strain: Low Risk  (01/18/2022)   Overall Financial Resource Strain (CARDIA)    Difficulty of Paying Living Expenses: Not hard at all  Food Insecurity: No Food Insecurity (01/18/2022)   Hunger Vital Sign    Worried About Running Out of Food in the Last Year: Never true    Ran Out of Food in the Last Year: Never true   Transportation Needs: No Transportation Needs (01/18/2022)   PRAPARE - Hydrologist (Medical): No    Lack of Transportation (Non-Medical): No  Physical Activity: Sufficiently Active (01/18/2022)   Exercise Vital Sign    Days of Exercise per Week: 7 days    Minutes of Exercise per Session: 30 min  Stress: No Stress Concern Present (01/18/2022)   [place]    Feeling of Stress : Not at all  Social Connections: [place] (01/18/2022)   Social Connection and Isolation Panel [NHANES]    Frequency of Communication with Friends and Family: More than three times a week    Frequency of Social Gatherings with Friends and Family: More than three times a week    Attends Religious Services: More than 4 times per year    Active Member of [employer] or Organizations: Yes    Attends Archivist Meetings: More than 4 times per year    Marital Status: Married  Human resources officer Violence: Not At Risk (01/18/2022)   Humiliation, Afraid, Rape, and Kick questionnaire  Fear of Current or Ex-Partner: No    Emotionally Abused: No    Physically Abused: No    Sexually Abused: No     History reviewed. No pertinent family history.   Review of Systems Pertinent items are noted in HPI.  Objective: Vital signs in last 24 hours:    01/18/2022   12:11 PM 01/18/2022   11:12 AM 01/18/2022   10:07 AM  Vitals with BMI  Height  '6\' 0"'$  '6\' 0"'$   Weight  214 lbs 2 oz 214 lbs 2 oz  BMI  99991111 [id number]  Systolic [id number] 0000000 0000000  Diastolic 70 86 86  Pulse  56 56      EXAM: General: Well nourished, well developed. Awake, alert and oriented to time, place, person. Normal mood and affect. No apparent distress. Breathing room air.  Operative Lower Extremity: Alignment - Neutral Deformity - None Skin intact Tenderness to palpation - left great toe MTP 5/5 TA, PT, GS, Per, EHL, FHL Sensation intact to light touch  throughout Palpable DP and PT pulses Special testing: None  The contralateral foot/ankle was examined for comparison and noted to be neurovascularly intact with no localized deformity, swelling, or tenderness.  Imaging Review All images taken were independently reviewed by me.  Assessment/Plan: The clinical and radiographic findings were reviewed and discussed at length with the patient.  The patient has left great toe MTP OA (hallux rigidus).  We spoke at length about the natural course of these findings. We discussed nonoperative and operative treatment options in detail.  The risks and benefits were presented and reviewed. The risks due to implant failure/irritation, infection, stiffness, nerve/vessel/tendon injury, wound healing issues, failure of this surgery, need for further surgery, thromboembolic events, amputation, death among others were discussed. The patient acknowledged the explanation and agreed to proceed with the plan.  Jerry Freeman  Orthopaedic Surgery [facility]

## 2022-05-17 NOTE — Discharge Instructions (Addendum)
Armond Hang, MD [facility]  Please read the following information regarding your care after surgery.  Medications  You only need a prescription for the narcotic pain medicine (ex. oxycodone, Percocet, Norco).  All of the other medicines listed below are available over the counter. ? Aleve 2 pills twice a day for the first 3 days after surgery. ? acetominophen (Tylenol) 650 mg every 4-6 hours as you need for minor to moderate pain ? oxycodone as prescribed for severe pain  ? To help prevent blood clots, take aspirin (81 mg) twice daily for 42 days after surgery (or total duration of nonweightbearing).  You should also get up every hour while you are awake to move around.  Weight Bearing ? Do NOT bear any weight on the operated leg or foot. This means do NOT touch your surgical leg to the ground!  Cast / Splint / Dressing ? If you have a splint, do NOT remove this. Keep your splint, cast or dressing clean and dry.  Don't put anything (coat hanger, pencil, etc) down inside of it.  If it gets wet, call the office immediately to schedule an appointment for a cast change.  Swelling IMPORTANT: It is normal for you to have swelling where you had surgery. To reduce swelling and pain, keep at least 3 pillows under your leg so that your toes are above your nose and your heel is above the level of your hip.  It may be necessary to keep your foot or leg elevated for several weeks.  This is critical to helping your incisions heal and your pain to feel better.  Follow Up Call my office at [phone] when you are discharged from the hospital or surgery center to schedule an appointment to be seen 7-10 days after surgery.  Call my office at [phone] if you develop a fever >101.5 F, nausea, vomiting, bleeding from the surgical site or severe pain.     Post Anesthesia Home Care Instructions  Activity: Get plenty of rest for the remainder of the day. A responsible individual must stay with  you for 24 hours following the procedure.  For the next 24 hours, DO NOT: -Drive a car -Paediatric nurse -Drink alcoholic beverages -Take any medication unless instructed by your physician -Make any legal decisions or sign important papers.  Meals: Start with liquid foods such as gelatin or soup. Progress to regular foods as tolerated. Avoid greasy, spicy, heavy foods. If nausea and/or vomiting occur, drink only clear liquids until the nausea and/or vomiting subsides. Call your physician if vomiting continues.  Special Instructions/Symptoms: Your throat may feel dry or sore from the anesthesia or the breathing tube placed in your throat during surgery. If this causes discomfort, gargle with warm salt water. The discomfort should disappear within 24 hours.  If you had a scopolamine patch placed behind your ear for the management of post- operative nausea and/or vomiting:  1. The medication in the patch is effective for 72 hours, after which it should be removed.  Wrap patch in a tissue and discard in the trash. Wash hands thoroughly with soap and water. 2. You may remove the patch earlier than 72 hours if you experience unpleasant side effects which may include dry mouth, dizziness or visual disturbances. 3. Avoid touching the patch. Wash your hands with soap and water after contact with the patch.

## 2022-05-19 ENCOUNTER — Other Ambulatory Visit: Payer: Self-pay

## 2022-05-19 ENCOUNTER — Encounter (HOSPITAL_BASED_OUTPATIENT_CLINIC_OR_DEPARTMENT_OTHER): Payer: Self-pay | Admitting: Orthopaedic Surgery

## 2022-05-19 ENCOUNTER — Encounter (HOSPITAL_BASED_OUTPATIENT_CLINIC_OR_DEPARTMENT_OTHER)
Admission: RE | Admit: 2022-05-19 | Discharge: 2022-05-19 | Disposition: A | Discharge status: Home / Self Care | Payer: Medicare HMO | Source: Ambulatory Visit | Attending: Orthopaedic Surgery | Admitting: Orthopaedic Surgery

## 2022-05-19 DIAGNOSIS — Z01810 Encounter for preprocedural cardiovascular examination: Secondary | ICD-10-CM | POA: Insufficient documentation

## 2022-05-24 NOTE — H&P (Signed)
ORTHOPAEDIC SURGERY H&P  Subjective:  The patient presents with left hallux rigidus.   Past Medical History:  Diagnosis Date   Arthritis    Cancer ([place])    thyroid    Eczema    Hypertension    Hypothyroidism    Seasonal allergies     Past Surgical History:  Procedure Laterality Date   ADENOIDECTOMY     HERNIA REPAIR     13 yrs ago   [place] Right 09/28/2019   Procedure: REPAIR QUADRICEP TENDON;  Surgeon: Sydnee Cabal, MD;  Location: [facility];  Service: Orthopedics;  Laterality: Right;   rupture quad tenden     THYROIDECTOMY     4 or 5 yrs ago   TONSILLECTOMY       (Not in an outpatient encounter)    No Known Allergies  Social History   Socioeconomic History   Marital status: Married    Spouse name: Not on file   Number of children: 2   Years of education: 16   Highest education level: Not on file  Occupational History   Occupation: rail roader  Tobacco Use   Smoking status: Former    Years: 18.00    Types: Cigarettes    Quit date: 12/15/1991    Years since quitting: 30.4   Smokeless tobacco: Never   Tobacco comments:    also smoked marijuana,and cocaine (free-base)   Vaping Use   Vaping Use: Never used  Substance and Sexual Activity   Alcohol use: Not Currently    Alcohol/week: 0.0 standard drinks of alcohol    Comment: hx of 30 years ago    Drug use: Not Currently    Comment: lst 30 years ago    Sexual activity: Not on file  Other Topics Concern   Not on file  Social History Narrative   Exercise cardio and weights 4 days/wk for 1 hour   Social Determinants of Health   Financial Resource Strain: Low Risk  (01/18/2022)   Overall Financial Resource Strain (CARDIA)    Difficulty of Paying Living Expenses: Not hard at all  Food Insecurity: No Food Insecurity (01/18/2022)   Hunger Vital Sign    Worried About Running Out of Food in the Last Year: Never true    Ran Out of Food in the Last Year: Never true  Transportation Needs: No  Transportation Needs (01/18/2022)   PRAPARE - Hydrologist (Medical): No    Lack of Transportation (Non-Medical): No  Physical Activity: Sufficiently Active (01/18/2022)   Exercise Vital Sign    Days of Exercise per Week: 7 days    Minutes of Exercise per Session: 30 min  Stress: No Stress Concern Present (01/18/2022)   [place]    Feeling of Stress : Not at all  Social Connections: [place] (01/18/2022)   Social Connection and Isolation Panel [NHANES]    Frequency of Communication with Friends and Family: More than three times a week    Frequency of Social Gatherings with Friends and Family: More than three times a week    Attends Religious Services: More than 4 times per year    Active Member of [employer] or Organizations: Yes    Attends Archivist Meetings: More than 4 times per year    Marital Status: Married  Human resources officer Violence: Not At Risk (01/18/2022)   Humiliation, Afraid, Rape, and Kick questionnaire    Fear of Current or  Ex-Partner: No    Emotionally Abused: No    Physically Abused: No    Sexually Abused: No     History reviewed. No pertinent family history.   Review of Systems Pertinent items are noted in HPI.  Objective: Vital signs in last 24 hours:    05/19/2022   12:38 PM 01/18/2022   12:11 PM 01/18/2022   11:12 AM  Vitals with BMI  Height '6\' 0"'$   '6\' 0"'$   Weight 210 lbs  214 lbs 2 oz  BMI [id number]  99991111  Systolic  123XX123 0000000  Diastolic  70 86  Pulse   56      EXAM: General: Well nourished, well developed. Awake, alert and oriented to time, place, person. Normal mood and affect. No apparent distress. Breathing room air.  Operative Lower Extremity: Alignment - Neutral Deformity - None Skin intact Tenderness to palpation - 1st MTP 5/5 TA, PT, GS, Per, EHL, FHL Sensation intact to light touch throughout Palpable DP and PT pulses Special  testing: None  The contralateral foot/ankle was examined for comparison and noted to be neurovascularly intact with no localized deformity, swelling, or tenderness.  Imaging Review All images taken were independently reviewed by me.  Assessment/Plan: The clinical and radiographic findings were reviewed and discussed at length with the patient.  The patient has left hallux rigidus.  We spoke at length about the natural course of these findings. We discussed nonoperative and operative treatment options in detail.  The risks and benefits were presented and reviewed. The risks due to implant failure/irritation, infection, stiffness, nerve/vessel/tendon injury, wound healing issues, failure of this surgery, need for further surgery, thromboembolic events, amputation, death among others were discussed. The patient acknowledged the explanation and agreed to proceed with the plan.  Armond Hang  Orthopaedic Surgery [facility]

## 2022-05-25 ENCOUNTER — Other Ambulatory Visit: Payer: Self-pay | Admitting: Emergency Medicine

## 2022-05-25 DIAGNOSIS — I10 Essential (primary) hypertension: Secondary | ICD-10-CM

## 2022-05-26 ENCOUNTER — Ambulatory Visit (HOSPITAL_BASED_OUTPATIENT_CLINIC_OR_DEPARTMENT_OTHER): Payer: Medicare HMO | Admitting: Anesthesiology

## 2022-05-26 ENCOUNTER — Other Ambulatory Visit: Payer: Self-pay

## 2022-05-26 ENCOUNTER — Ambulatory Visit (HOSPITAL_BASED_OUTPATIENT_CLINIC_OR_DEPARTMENT_OTHER)
Admission: RE | Admit: 2022-05-26 | Discharge: 2022-05-26 | Disposition: A | Discharge status: Home / Self Care | Payer: Medicare HMO | Attending: Orthopaedic Surgery | Admitting: Orthopaedic Surgery

## 2022-05-26 ENCOUNTER — Encounter (HOSPITAL_BASED_OUTPATIENT_CLINIC_OR_DEPARTMENT_OTHER): Payer: Self-pay | Admitting: Orthopaedic Surgery

## 2022-05-26 ENCOUNTER — Encounter (HOSPITAL_BASED_OUTPATIENT_CLINIC_OR_DEPARTMENT_OTHER)
Admission: RE | Disposition: A | Discharge status: Home / Self Care | Payer: Self-pay | Source: Home / Self Care | Attending: Orthopaedic Surgery

## 2022-05-26 ENCOUNTER — Ambulatory Visit (HOSPITAL_COMMUNITY): Payer: Medicare HMO

## 2022-05-26 DIAGNOSIS — Z87891 Personal history of nicotine dependence: Secondary | ICD-10-CM | POA: Insufficient documentation

## 2022-05-26 DIAGNOSIS — I10 Essential (primary) hypertension: Secondary | ICD-10-CM | POA: Diagnosis not present

## 2022-05-26 DIAGNOSIS — M2022 Hallux rigidus, left foot: Secondary | ICD-10-CM

## 2022-05-26 DIAGNOSIS — G8918 Other acute postprocedural pain: Secondary | ICD-10-CM | POA: Diagnosis not present

## 2022-05-26 DIAGNOSIS — Z0389 Encounter for observation for other suspected diseases and conditions ruled out: Secondary | ICD-10-CM | POA: Diagnosis not present

## 2022-05-26 DIAGNOSIS — Z01818 Encounter for other preprocedural examination: Secondary | ICD-10-CM

## 2022-05-26 HISTORY — PX: ARTHRODESIS METATARSALPHALANGEAL JOINT (MTPJ): SHX6566

## 2022-05-26 SURGERY — FUSION, JOINT, GREAT TOE
Anesthesia: Regional | Site: Toe | Laterality: Left

## 2022-05-26 MED ORDER — CEFAZOLIN SODIUM-DEXTROSE 2-4 GM/100ML-% IV SOLN
INTRAVENOUS | Status: AC
  Filled 2022-05-26: qty 100

## 2022-05-26 MED ORDER — DEXAMETHASONE SODIUM PHOSPHATE 10 MG/ML IJ SOLN
INTRAMUSCULAR | Status: DC | PRN
  Administered 2022-05-26: 10 mg via INTRAVENOUS

## 2022-05-26 MED ORDER — PROPOFOL 10 MG/ML IV BOLUS
INTRAVENOUS | Status: AC
  Filled 2022-05-26: qty 20

## 2022-05-26 MED ORDER — LACTATED RINGERS IV SOLN: INTRAVENOUS | Status: DC

## 2022-05-26 MED ORDER — PROPOFOL 10 MG/ML IV BOLUS
INTRAVENOUS | Status: DC | PRN
  Administered 2022-05-26: 200 mg via INTRAVENOUS

## 2022-05-26 MED ORDER — VANCOMYCIN HCL 500 MG/100ML IV SOLN
INTRAVENOUS | Status: AC
  Filled 2022-05-26: qty 100

## 2022-05-26 MED ORDER — FENTANYL CITRATE (PF) 100 MCG/2ML IJ SOLN: 25.0000 ug | INTRAMUSCULAR | Status: DC | PRN

## 2022-05-26 MED ORDER — 0.9 % SODIUM CHLORIDE (POUR BTL) OPTIME
TOPICAL | Status: DC | PRN
  Administered 2022-05-26: 1000 mL

## 2022-05-26 MED ORDER — ACETAMINOPHEN 500 MG PO TABS
1000.0000 mg | ORAL_TABLET | Freq: Once | ORAL | Status: AC
  Administered 2022-05-26: 1000 mg via ORAL

## 2022-05-26 MED ORDER — LIDOCAINE 2% (20 MG/ML) 5 ML SYRINGE
INTRAMUSCULAR | Status: AC
  Filled 2022-05-26: qty 5

## 2022-05-26 MED ORDER — MIDAZOLAM HCL 2 MG/2ML IJ SOLN
2.0000 mg | Freq: Once | INTRAMUSCULAR | Status: AC
  Administered 2022-05-26: 2 mg via INTRAVENOUS

## 2022-05-26 MED ORDER — MIDAZOLAM HCL 2 MG/2ML IJ SOLN
INTRAMUSCULAR | Status: AC
  Filled 2022-05-26: qty 2

## 2022-05-26 MED ORDER — ONDANSETRON HCL 4 MG/2ML IJ SOLN
INTRAMUSCULAR | Status: DC | PRN
  Administered 2022-05-26: 4 mg via INTRAVENOUS

## 2022-05-26 MED ORDER — EPHEDRINE 5 MG/ML INJ
INTRAVENOUS | Status: AC
  Filled 2022-05-26: qty 5

## 2022-05-26 MED ORDER — VANCOMYCIN HCL 1500 MG/300ML IV SOLN
1500.0000 mg | INTRAVENOUS | Status: AC
  Administered 2022-05-26: 1500 mg via INTRAVENOUS
  Filled 2022-05-26: qty 300

## 2022-05-26 MED ORDER — ONDANSETRON HCL 4 MG/2ML IJ SOLN
INTRAMUSCULAR | Status: AC
  Filled 2022-05-26: qty 2

## 2022-05-26 MED ORDER — FENTANYL CITRATE (PF) 100 MCG/2ML IJ SOLN
INTRAMUSCULAR | Status: AC
  Filled 2022-05-26: qty 2

## 2022-05-26 MED ORDER — VANCOMYCIN HCL 500 MG IV SOLR
INTRAVENOUS | Status: AC
  Filled 2022-05-26: qty 20

## 2022-05-26 MED ORDER — FENTANYL CITRATE (PF) 100 MCG/2ML IJ SOLN
100.0000 ug | Freq: Once | INTRAMUSCULAR | Status: AC
  Administered 2022-05-26: 100 ug via INTRAVENOUS

## 2022-05-26 MED ORDER — VANCOMYCIN HCL 1000 MG IV SOLR
INTRAVENOUS | Status: AC
  Filled 2022-05-26: qty 20

## 2022-05-26 MED ORDER — EPHEDRINE SULFATE (PRESSORS) 50 MG/ML IJ SOLN
INTRAMUSCULAR | Status: DC | PRN
  Administered 2022-05-26 (×2): 5 mg via INTRAVENOUS
  Administered 2022-05-26: 10 mg via INTRAVENOUS

## 2022-05-26 MED ORDER — FENTANYL CITRATE (PF) 100 MCG/2ML IJ SOLN
INTRAMUSCULAR | Status: DC | PRN
  Administered 2022-05-26: 50 ug via INTRAVENOUS

## 2022-05-26 MED ORDER — CEFAZOLIN SODIUM-DEXTROSE 2-4 GM/100ML-% IV SOLN
2.0000 g | Freq: Once | INTRAVENOUS | Status: AC
  Administered 2022-05-26: 2 g via INTRAVENOUS

## 2022-05-26 MED ORDER — ACETAMINOPHEN 500 MG PO TABS
ORAL_TABLET | ORAL | Status: AC
  Filled 2022-05-26: qty 2

## 2022-05-26 MED ORDER — LIDOCAINE HCL (CARDIAC) PF 100 MG/5ML IV SOSY
PREFILLED_SYRINGE | INTRAVENOUS | Status: DC | PRN
  Administered 2022-05-26: 50 mg via INTRATRACHEAL

## 2022-05-26 MED ORDER — VANCOMYCIN HCL 500 MG IV SOLR
INTRAVENOUS | Status: DC | PRN
  Administered 2022-05-26: 500 mg via TOPICAL

## 2022-05-26 MED ORDER — DEXAMETHASONE SODIUM PHOSPHATE 10 MG/ML IJ SOLN
INTRAMUSCULAR | Status: DC | PRN
  Administered 2022-05-26 (×2): 5 mg

## 2022-05-26 MED ORDER — ROPIVACAINE HCL 5 MG/ML IJ SOLN
INTRAMUSCULAR | Status: DC | PRN
  Administered 2022-05-26: 20 mL via PERINEURAL
  Administered 2022-05-26: 30 mL via PERINEURAL

## 2022-05-26 MED ORDER — POVIDONE-IODINE 10 % EX SOLN
CUTANEOUS | Status: DC | PRN
  Administered 2022-05-26: 1 via TOPICAL

## 2022-05-26 MED ORDER — DEXAMETHASONE SODIUM PHOSPHATE 10 MG/ML IJ SOLN
INTRAMUSCULAR | Status: AC
  Filled 2022-05-26: qty 1

## 2022-05-26 SURGICAL SUPPLY — 80 items
APL PRP STRL LF DISP 70% ISPRP (MISCELLANEOUS) ×2
BANDAGE ESMARK 6X9 LF (GAUZE/BANDAGES/DRESSINGS) ×1 IMPLANT
BIT DRILL 2.5X2.75 QC CALB (BIT) IMPLANT
BIT DRILL CALIBRATED 2.7 (BIT) IMPLANT
BLADE AVERAGE 25X9 (BLADE) IMPLANT
BLADE SURG 15 STRL LF DISP TIS (BLADE) ×2 IMPLANT
BLADE SURG 15 STRL SS (BLADE) ×6
BNDG CMPR 5X62 HK CLSR LF (GAUZE/BANDAGES/DRESSINGS) ×1
BNDG CMPR 6"X 5 YARDS HK CLSR (GAUZE/BANDAGES/DRESSINGS) ×1
BNDG CMPR 9X6 STRL LF SNTH (GAUZE/BANDAGES/DRESSINGS) ×1
BNDG ELASTIC 4X5.8 VLCR STR LF (GAUZE/BANDAGES/DRESSINGS) ×1 IMPLANT
BNDG ELASTIC 6INX 5YD STR LF (GAUZE/BANDAGES/DRESSINGS) ×1 IMPLANT
BNDG ESMARK 6X9 LF (GAUZE/BANDAGES/DRESSINGS) ×1
CANISTER SUCT 1200ML W/VALVE (MISCELLANEOUS) ×1 IMPLANT
CHLORAPREP W/TINT 26 (MISCELLANEOUS) ×1 IMPLANT
COVER BACK TABLE 60X90IN (DRAPES) ×1 IMPLANT
CUFF TOURN SGL QUICK 34 (TOURNIQUET CUFF)
CUFF TRNQT CYL 34X4.125X (TOURNIQUET CUFF) ×1 IMPLANT
DRAPE C-ARM 42X72 X-RAY (DRAPES) ×1 IMPLANT
DRAPE C-ARMOR (DRAPES) ×1 IMPLANT
DRAPE EXTREMITY T 121X128X90 (DISPOSABLE) ×1 IMPLANT
DRAPE IMP U-DRAPE 54X76 (DRAPES) ×1 IMPLANT
DRAPE U-SHAPE 47X51 STRL (DRAPES) ×1 IMPLANT
DRILL CANN MAX VPC 3.2MM (DRILL) IMPLANT
ELECT REM PT RETURN 9FT ADLT (ELECTROSURGICAL) ×1
ELECTRODE REM PT RTRN 9FT ADLT (ELECTROSURGICAL) ×1 IMPLANT
GAUZE PAD ABD 8X10 STRL (GAUZE/BANDAGES/DRESSINGS) ×5 IMPLANT
GAUZE SPONGE 4X4 12PLY STRL (GAUZE/BANDAGES/DRESSINGS) ×1 IMPLANT
GAUZE XEROFORM 1X8 LF (GAUZE/BANDAGES/DRESSINGS) ×1 IMPLANT
GLOVE BIO SURGEON STRL SZ7.5 (GLOVE) ×1 IMPLANT
GLOVE BIOGEL PI IND STRL 8 (GLOVE) ×1 IMPLANT
GLOVE SURG SS PI 7.5 STRL IVOR (GLOVE) ×1 IMPLANT
GOWN STRL REUS W/ TWL LRG LVL3 (GOWN DISPOSABLE) ×2 IMPLANT
GOWN STRL REUS W/TWL LRG LVL3 (GOWN DISPOSABLE) ×2
K-WIRE ACE 1.6X6 (WIRE) ×3
K-WIRE COCR 1.4 X 127 (WIRE) ×1
KWIRE ACE 1.6X6 (WIRE) IMPLANT
KWIRE COCR 1.4 X 127 (WIRE) IMPLANT
MAX VPC CANN DRILL 3.2MM (DRILL) ×1
NDL HYPO 22X1.5 SAFETY MO (MISCELLANEOUS) IMPLANT
NEEDLE HYPO 22X1.5 SAFETY MO (MISCELLANEOUS) IMPLANT
NEEDLE SAFETY HYPO 22GAX1.5 (MISCELLANEOUS)
NS IRRIG 1000ML POUR BTL (IV SOLUTION) ×1 IMPLANT
PACK BASIN DAY SURGERY FS (CUSTOM PROCEDURE TRAY) ×1 IMPLANT
PAD CAST 4YDX4 CTTN HI CHSV (CAST SUPPLIES) ×1 IMPLANT
PADDING CAST ABS COTTON 4X4 ST (CAST SUPPLIES) IMPLANT
PADDING CAST COTTON 4X4 STRL (CAST SUPPLIES) ×1
PADDING CAST COTTON 6X4 STRL (CAST SUPPLIES) ×1 IMPLANT
PADDING CAST SYNTHETIC 4X4 STR (CAST SUPPLIES) IMPLANT
PENCIL SMOKE EVACUATOR (MISCELLANEOUS) ×1 IMPLANT
PLATE LG 1ST LT MTP FUSION (Plate) IMPLANT
SCREW LOCK CORT STAR 3.5X10 (Screw) IMPLANT
SCREW LOCK CORT STAR 3.5X12 (Screw) IMPLANT
SCREW LOCK CORT STAR 3.5X14 (Screw) IMPLANT
SCREW LOCK CORT STAR 3.5X16 (Screw) IMPLANT
SCREW LOW PROFILE 22MMX3.5MM (Screw) IMPLANT
SCREW NON LOCKING LP 3.5 16MM (Screw) IMPLANT
SCREW VPC 4.0X38 (Screw) IMPLANT
SHEET MEDIUM DRAPE 40X70 STRL (DRAPES) ×1 IMPLANT
SLEEVE SCD COMPRESS KNEE MED (STOCKING) ×1 IMPLANT
SPIKE FLUID TRANSFER (MISCELLANEOUS) IMPLANT
SPLINT PLASTER CAST FAST 5X30 (CAST SUPPLIES) ×20 IMPLANT
SPONGE T-LAP 18X18 ~~LOC~~+RFID (SPONGE) ×1 IMPLANT
STOCKINETTE 6  STRL (DRAPES) ×1
STOCKINETTE 6 STRL (DRAPES) ×1 IMPLANT
SUCTION FRAZIER HANDLE 10FR (MISCELLANEOUS) ×1
SUCTION TUBE FRAZIER 10FR DISP (MISCELLANEOUS) IMPLANT
SUT ETHILON 2 0 FS 18 (SUTURE) ×1 IMPLANT
SUT FIBERWIRE #2 38 T-5 BLUE (SUTURE)
SUT MNCRL AB 3-0 PS2 18 (SUTURE) IMPLANT
SUT VIC AB 2-0 SH 27 (SUTURE) ×1
SUT VIC AB 2-0 SH 27XBRD (SUTURE) ×1 IMPLANT
SUT VICRYL 0 SH 27 (SUTURE) IMPLANT
SUTURE FIBERWR #2 38 T-5 BLUE (SUTURE) IMPLANT
SYR BULB EAR ULCER 3OZ GRN STR (SYRINGE) ×1 IMPLANT
SYR CONTROL 10ML LL (SYRINGE) IMPLANT
TOWEL GREEN STERILE FF (TOWEL DISPOSABLE) ×2 IMPLANT
TUBE CONNECTING 20X1/4 (TUBING) IMPLANT
UNDERPAD 30X36 HEAVY ABSORB (UNDERPADS AND DIAPERS) ×1 IMPLANT
WIRE COMPRESSION 23 SHORT (WIRE) IMPLANT

## 2022-05-26 NOTE — Anesthesia Postprocedure Evaluation (Signed)
Anesthesia Post Note  Patient: Jerry Freeman  Procedure(s) Performed: ARTHRODESIS METATARSALPHALANGEAL JOINT (MTPJ), left 1st (Left: Toe)     Patient location during evaluation: PACU Anesthesia Type: Regional and General Level of consciousness: awake and alert Pain management: pain level controlled Vital Signs Assessment: post-procedure vital signs reviewed and stable Respiratory status: spontaneous breathing, nonlabored ventilation, respiratory function stable and patient connected to nasal cannula oxygen Cardiovascular status: blood pressure returned to baseline and stable Postop Assessment: no apparent nausea or vomiting Anesthetic complications: no  No notable events documented.  Last Vitals:  Vitals:   05/26/22 1145 05/26/22 1222  BP: 127/78 133/80  Pulse: 73 74  Resp: 14 18  Temp:  36.5 C  SpO2: 100% 96%    Last Pain:  Vitals:   05/26/22 1222  TempSrc:   PainSc: 0-No pain                 Effie Berkshire

## 2022-05-26 NOTE — H&P (Addendum)
H&P Update:  -History and Physical Reviewed  -Patient has been re-examined  -No change in the plan of care  -The risks and benefits were presented and reviewed. The risks due to hardware/suture failure and/or irritation, new/persistent infection, stiffness, nerve/vessel/tendon injury or rerupture of repaired tendon, nonunion/malunion, allograft usage, wound healing issues, development of arthritis, failure of this surgery, possibility of external fixation with delayed definitive surgery, need for further surgery, thromboembolic events, anesthesia/medical complications, amputation, death among others were discussed. The patient acknowledged the explanation, agreed to proceed with the plan and a consent was signed.  In addition, we also discussed the role of allograft and/or 2nd metatarsal shortening should either be deemed necessary intra-op. The patient and his wife understand and agree with either/both if needed.  Armond Hang

## 2022-05-26 NOTE — Anesthesia Procedure Notes (Signed)
Anesthesia Regional Block: Popliteal block   Pre-Anesthetic Checklist: , timeout performed,  Correct Patient, Correct Site, Correct Laterality,  Correct Procedure, Correct Position, site marked,  Risks and benefits discussed,  Pre-op evaluation,  At surgeon's request and post-op pain management  Laterality: Left  Prep: Maximum Sterile Barrier Precautions used, chloraprep       Needles:  Injection technique: Single-shot  Needle Type: Echogenic Stimulator Needle     Needle Length: 9cm  Needle Gauge: 21     Additional Needles:   Procedures:,,,, ultrasound used (permanent image in chart),,    Narrative:  Start time: 05/26/2022 7:56 AM End time: 05/26/2022 8:00 AM Injection made incrementally with aspirations every 5 mL. Anesthesiologist: Freddrick March, MD

## 2022-05-26 NOTE — Transfer of Care (Signed)
Immediate Anesthesia Transfer of Care Note  Patient: Jerry Freeman  Procedure(s) Performed: ARTHRODESIS METATARSALPHALANGEAL JOINT (MTPJ), left 1st (Left: Toe)  Patient Location: PACU  Anesthesia Type:GA combined with regional for post-op pain  Level of Consciousness: drowsy, patient cooperative, and responds to stimulation  Airway & Oxygen Therapy: Patient Spontanous Breathing and Patient connected to face mask oxygen  Post-op Assessment: Report given to RN and Post -op Vital signs reviewed and stable  Post vital signs: Reviewed and stable  Last Vitals:  Vitals Value Taken Time  BP    Temp    Pulse 77 05/26/22 1121  Resp    SpO2 98 % 05/26/22 1121  Vitals shown include unvalidated device data.  Last Pain:  Vitals:   05/26/22 0655  TempSrc: Oral  PainSc: 0-No pain         Complications: No notable events documented.

## 2022-05-26 NOTE — Progress Notes (Signed)
Assisted Dr. Woodrum with left, adductor canal, popliteal, ultrasound guided block. Side rails up, monitors on throughout procedure. See vital signs in flow sheet. Tolerated Procedure well. 

## 2022-05-26 NOTE — Anesthesia Procedure Notes (Signed)
Anesthesia Regional Block: Adductor canal block   Pre-Anesthetic Checklist: , timeout performed,  Correct Patient, Correct Site, Correct Laterality,  Correct Procedure, Correct Position, site marked,  Risks and benefits discussed,  Pre-op evaluation,  At surgeon's request and post-op pain management  Laterality: Left  Prep: Maximum Sterile Barrier Precautions used, chloraprep       Needles:  Injection technique: Single-shot  Needle Type: Echogenic Stimulator Needle     Needle Length: 9cm  Needle Gauge: 21     Additional Needles:   Procedures:,,,, ultrasound used (permanent image in chart),,    Narrative:  Start time: 05/26/2022 8:00 AM End time: 05/26/2022 8:03 AM Injection made incrementally with aspirations every 5 mL. Anesthesiologist: Freddrick March, MD

## 2022-05-26 NOTE — Anesthesia Preprocedure Evaluation (Addendum)
Anesthesia Evaluation  Patient identified by MRN, date of birth, ID band Patient awake    Reviewed: Allergy & Precautions, NPO status , Patient's Chart, lab work & pertinent test results  Airway Mallampati: II  TM Distance: >3 FB Neck ROM: Full    Dental no notable dental hx.    Pulmonary former smoker   Pulmonary exam normal breath sounds clear to auscultation       Cardiovascular hypertension, Pt. on medications Normal cardiovascular exam Rhythm:Regular Rate:Normal     Neuro/Psych negative neurological ROS  negative psych ROS   GI/Hepatic negative GI ROS, Neg liver ROS,,,  Endo/Other  Hypothyroidism    Renal/GU negative Renal ROS  negative genitourinary   Musculoskeletal negative musculoskeletal ROS (+)    Abdominal   Peds  Hematology negative hematology ROS (+)   Anesthesia Other Findings   Reproductive/Obstetrics                             Anesthesia Physical Anesthesia Plan  ASA: 2  Anesthesia Plan: General and Regional   Post-op Pain Management: Regional block* and Tylenol PO (pre-op)*   Induction: Intravenous  PONV Risk Score and Plan: 2 and Ondansetron, Dexamethasone and Midazolam  Airway Management Planned: LMA  Additional Equipment:   Intra-op Plan:   Post-operative Plan: Extubation in OR  Informed Consent: I have reviewed the patients History and Physical, chart, labs and discussed the procedure including the risks, benefits and alternatives for the proposed anesthesia with the patient or authorized representative who has indicated his/her understanding and acceptance.     Dental advisory given  Plan Discussed with: CRNA  Anesthesia Plan Comments:        Anesthesia Quick Evaluation

## 2022-05-26 NOTE — Anesthesia Procedure Notes (Addendum)
Procedure Name: LMA Insertion Date/Time: 05/26/2022 8:58 AM  Performed by: Glory Buff, CRNAPre-anesthesia Checklist: Patient identified, Emergency Drugs available, Suction available and Patient being monitored Patient Re-evaluated:Patient Re-evaluated prior to induction Oxygen Delivery Method: Circle system utilized Preoxygenation: Pre-oxygenation with 100% oxygen Induction Type: IV induction LMA: LMA inserted LMA Size: 4.0 Number of attempts: 1 Placement Confirmation: positive ETCO2 Tube secured with: Tape Dental Injury: Teeth and Oropharynx as per pre-operative assessment

## 2022-05-27 ENCOUNTER — Encounter (HOSPITAL_BASED_OUTPATIENT_CLINIC_OR_DEPARTMENT_OTHER): Payer: Self-pay | Admitting: Orthopaedic Surgery

## 2022-05-27 NOTE — Op Note (Signed)
05/26/2022  12:45 AM   PATIENT: Jerry Freeman  67 y.o. male  MRN: [id number]   PRE-OPERATIVE DIAGNOSIS:   Acquired left hallux rigidus   POST-OPERATIVE DIAGNOSIS:   Acquired left hallux rigidus   PROCEDURE: Left first metatarsophalangeal joint arthrodesis   SURGEON:  Armond Hang, MD   ASSISTANT: None   ANESTHESIA: General, regional   EBL: Minimal   TOURNIQUET:    Total Tourniquet Time Documented: Thigh (Left) - 108 minutes Total: Thigh (Left) - 123XX123 minutes    COMPLICATIONS: None apparent   DISPOSITION: Extubated, awake and stable to recovery.   INDICATION FOR PROCEDURE: The patient presented with above diagnosis.  We discussed the diagnosis, alternative treatment options, risks and benefits of the above surgical intervention, as well as alternative non-operative treatments. All questions/concerns were addressed and the patient/family demonstrated appropriate understanding of the diagnosis, the procedure, the postoperative course, and overall prognosis. The patient wished to proceed with surgical intervention and signed an informed surgical consent as such, in each others presence prior to surgery.   PROCEDURE IN DETAIL: After preoperative consent was obtained and the correct operative site was identified, the patient was brought to the operating room supine on stretcher and transferred onto operating table. General anesthesia was induced. Preoperative antibiotics were administered. Surgical timeout was taken. The patient was then positioned supine with an ipsilateral hip bump. The operative lower extremity was prepped and draped in standard sterile fashion with a tourniquet around the thigh. The extremity was exsanguinated and the tourniquet was inflated to 275 mmHg.  A dorsal approach was made directly over the first metatarsophalangeal joint. This was carried down to the level of the extensor hallucis longus tendon. The EHL was noted to be partially torn  and degenerated over the dorsal osteophyte of the 1st metatarsal head. This was debrided and repaired at closure later in the procedure. The capsule was incised medial to the tendon, and the tendon was protected throughout the procedure. The capsule and surrounding ligaments were released in order to allow full exposure of the first MTP joint. The cartilage remaining on either articular surface of the joint was debrided using curette and rongeur. A guide wire was placed directly into the first metatarsal and appropriate sized reamer was used to prepare the surface. This guidewire was then used in the proximal phalanx, and that articular surface was reamed similarly. Both joint surfaces were perforated with a K wire in order to stimulate bone healing across the arthrodesis site. The medial eminence was resected with a saw blade.    A crossing guide wire was placed to secure the arthrodesis in appropriate position. This was verified clinically using foot plate as well as with fluoroscopy. A variable pitch compression screw was implanted over this guidewire using cannulated drill technique achieving excellent compression across the arthrodesis site. We then selected an appropriate sized Zimmer plate and implanted this with a series of cortical and locking screws. Footplate and fluoroscopy were used throughout to verify appropriate position of the fusion.   The surgical sites were thoroughly irrigated. The tourniquet was deflated and hemostasis achieved. Betadine and vancomycin powder applied in the wound. The deep layers were closed using 2-0 vicryl. The skin was closed without tension using 2-0 nylon suture.    The leg was cleaned with saline and sterile dressings with gauze were applied. A well padded bulky short leg splint was applied. The patient was awakened from anesthesia and transported to the recovery room in stable condition.    FOLLOW UP  PLAN: -transfer to PACU, then home -strict NWB operative  extremity, maximum elevation -maintain short leg splint until follow up -DVT ppx: Aspirin 81 mg twice daily while NWB -follow up as outpatient within 7-10 days for wound check with exchange of short leg splint to short leg cast -sutures out in 2-3 weeks in outpatient office   RADIOGRAPHS: AP, lateral, oblique and stress radiographs of the left foot were obtained intraoperatively. These showed interval 1st MTP joint fusion. All hardware is appropriately positioned and of the appropriate lengths. No acute injuries are noted.   Armond Hang Orthopaedic Surgery [facility]

## 2022-05-27 NOTE — Progress Notes (Signed)
Left message stating courtesy call and if any questions or concerns please call the doctors office.  

## 2022-06-01 DIAGNOSIS — Z4789 Encounter for other orthopedic aftercare: Secondary | ICD-10-CM | POA: Diagnosis not present

## 2022-06-09 DIAGNOSIS — M25531 Pain in right wrist: Secondary | ICD-10-CM | POA: Diagnosis not present

## 2022-06-09 DIAGNOSIS — M25532 Pain in left wrist: Secondary | ICD-10-CM | POA: Diagnosis not present

## 2022-06-11 ENCOUNTER — Other Ambulatory Visit: Payer: Self-pay | Admitting: Allergy

## 2022-06-11 ENCOUNTER — Other Ambulatory Visit: Payer: Self-pay | Admitting: Emergency Medicine

## 2022-06-11 DIAGNOSIS — E890 Postprocedural hypothyroidism: Secondary | ICD-10-CM

## 2022-06-19 ENCOUNTER — Other Ambulatory Visit: Payer: Self-pay | Admitting: Emergency Medicine

## 2022-06-19 DIAGNOSIS — E785 Hyperlipidemia, unspecified: Secondary | ICD-10-CM

## 2022-07-09 DIAGNOSIS — M2022 Hallux rigidus, left foot: Secondary | ICD-10-CM | POA: Diagnosis not present

## 2022-07-20 ENCOUNTER — Telehealth: Payer: Self-pay | Admitting: Emergency Medicine

## 2022-07-20 NOTE — Telephone Encounter (Signed)
Called patient to schedule Medicare Annual Wellness Visit (AWV). Left message for patient to call back and schedule Medicare Annual Wellness Visit (AWV).  Last date of AWV: 01/18/2022  Please schedule an appointment at any time with [facility].  If any questions, please contact me at [phone].  Thank you ,  Randon Goldsmith Care Guide [facility] AWV TEAM Direct Dial: [phone]

## 2022-07-26 ENCOUNTER — Telehealth: Payer: Self-pay | Admitting: Emergency Medicine

## 2022-07-26 NOTE — Telephone Encounter (Signed)
Contacted [name] to schedule their annual wellness visit. Appointment made for 08/10/2022.  [facility] Care Guide [facility] AWV TEAM Direct Dial: [phone]

## 2022-07-30 DIAGNOSIS — M79675 Pain in left toe(s): Secondary | ICD-10-CM | POA: Diagnosis not present

## 2022-08-10 ENCOUNTER — Ambulatory Visit (INDEPENDENT_AMBULATORY_CARE_PROVIDER_SITE_OTHER): Payer: Medicare HMO

## 2022-08-10 VITALS — Wt 215.0 lb

## 2022-08-10 DIAGNOSIS — Z0000 Encounter for general adult medical examination without abnormal findings: Secondary | ICD-10-CM

## 2022-08-10 NOTE — Progress Notes (Signed)
Subjective:   Jerry Freeman is a 67 y.o. male who presents for Medicare Annual/Subsequent preventive examination.  Review of Systems    I connected with  Jerry Freeman on 08/10/22 by a audio enabled telemedicine application and verified that I am speaking with the correct person using two identifiers.  Patient Location: Home  Provider Location: Home Office  Patient Medicare AWV questionnaire was completed by the patient on 08/05/22; I have confirmed that all information answered by patient is correct and no changes since this date.     I discussed the limitations of evaluation and management by telemedicine. The patient expressed understanding and agreed to proceed.  Cardiac Risk Factors include: hypertension     Objective:    Today's Vitals   08/10/22 0947  Weight: 215 lb (97.5 kg)   Body mass index is 29.16 kg/m.     08/10/2022    9:53 AM 05/19/2022   12:40 PM 01/18/2022   11:49 AM 09/24/2019    2:11 PM  Advanced Directives  Does Patient Have a Medical Advance Directive? Yes Yes Yes No  Type of Estate agent of [place];Living will Healthcare Power of [place];Living will Healthcare Power of [place];Living will   Does patient want to make changes to medical advance directive?  No - Patient declined    Copy of Healthcare Power of Attorney in Chart? No - copy requested No - copy requested No - copy requested     Current Medications (verified) Outpatient Encounter Medications as of 08/10/2022  Medication Sig   amLODipine (NORVASC) 5 MG tablet TAKE 1 TABLET DAILY   Ascorbic Acid (VITAMIN C) 1000 MG tablet Take 1,000 mg by mouth daily.   Calcium-Phosphorus-Vitamin D (CITRACAL +D3 PO) Take 1 tablet by mouth daily.   Krill Oil 350 MG CAPS Take 350 mg by mouth daily.   levocetirizine (XYZAL) 5 MG tablet Take 1 tablet (5 mg total) by mouth every evening.   levothyroxine (SYNTHROID) 137 MCG tablet TAKE 1 TABLET BY MOUTH DAILY BEFORE BREAKFAST.   meloxicam  (MOBIC) 7.5 MG tablet TAKE 1 TABLET DAILY AS     NEEDED FOR PAIN   Multiple Vitamins-Minerals (MEGA MULTI MEN PO) Take 1 packet by mouth daily. GNC 50 PLUS    rosuvastatin (CRESTOR) 10 MG tablet TAKE 1 TABLET DAILY   [DISCONTINUED] sildenafil (VIAGRA) 100 MG tablet TAKE 1 TABLET DAILY AS NEEDED FOR ERECTILE DYSFUNCTION AT LEAST 1 HOUR PRIOR TO SEX.   [DISCONTINUED] tamsulosin (FLOMAX) 0.4 MG CAPS capsule TAKE 1 CAPSULE DAILY AFTER SUPPER   No facility-administered encounter medications on file as of 08/10/2022.    Allergies (verified) Patient has no known allergies.   History: Past Medical History:  Diagnosis Date   Arthritis    Cancer (HCC)    thyroid    Eczema    Hypertension    Hypothyroidism    Seasonal allergies    Past Surgical History:  Procedure Laterality Date   ADENOIDECTOMY     ARTHRODESIS METATARSALPHALANGEAL JOINT (MTPJ) Left 05/26/2022   Procedure: ARTHRODESIS METATARSALPHALANGEAL JOINT (MTPJ), left 1st;  Surgeon: Netta Cedars, MD;  Location: [facility];  Service: Orthopedics;  Laterality: Left;   HERNIA REPAIR     13 yrs ago   QUADRICEPS TENDON REPAIR Right 09/28/2019   Procedure: REPAIR QUADRICEP TENDON;  Surgeon: Eugenia Mcalpine, MD;  Location: [facility];  Service: Orthopedics;  Laterality: Right;   rupture quad tenden     THYROIDECTOMY     4 or 5 yrs  ago   TONSILLECTOMY     Family History  Problem Relation Age of Onset   Cancer Mother        lung   Cancer Father        rectal   High Cholesterol Brother    Hypertension Brother    Stroke Brother    Kidney disease Maternal Grandmother        dialysis   Hypertension Maternal Grandmother    Hyperlipidemia Maternal Grandmother    Arthritis Maternal Grandfather    Allergic rhinitis Neg Hx    Asthma Neg Hx    Eczema Neg Hx    Urticaria Neg Hx    Social History   Socioeconomic History   Marital status: Married    Spouse name: Not on file   Number of children: 2   Years of  education: 16   Highest education level: Not on file  Occupational History   Occupation: rail roader  Tobacco Use   Smoking status: Former    Years: 18    Types: Cigarettes    Quit date: 12/15/1991    Years since quitting: 30.6   Smokeless tobacco: Never   Tobacco comments:    also smoked marijuana,and cocaine (free-base)   Vaping Use   Vaping Use: Never used  Substance and Sexual Activity   Alcohol use: Not Currently    Alcohol/week: 0.0 standard drinks of alcohol    Comment: hx of 30 years ago    Drug use: Not Currently    Comment: lst 30 years ago    Sexual activity: Not on file  Other Topics Concern   Not on file  Social History Narrative   Exercise cardio and weights 4 days/wk for 1 hour   Social Determinants of Health   Financial Resource Strain: Low Risk  (08/10/2022)   Overall Financial Resource Strain (CARDIA)    Difficulty of Paying Living Expenses: Not hard at all  Food Insecurity: No Food Insecurity (08/10/2022)   Hunger Vital Sign    Worried About Running Out of Food in the Last Year: Never true    Ran Out of Food in the Last Year: Never true  Transportation Needs: No Transportation Needs (08/10/2022)   PRAPARE - Administrator, Civil Service (Medical): No    Lack of Transportation (Non-Medical): No  Physical Activity: Sufficiently Active (08/10/2022)   Exercise Vital Sign    Days of Exercise per Week: 7 days    Minutes of Exercise per Session: 30 min  Stress: No Stress Concern Present (08/10/2022)   [employer] of Occupational Health - Occupational Stress Questionnaire    Feeling of Stress : Not at all  Social Connections: Socially Integrated (08/10/2022)   Social Connection and Isolation Panel [NHANES]    Frequency of Communication with Friends and Family: More than three times a week    Frequency of Social Gatherings with Friends and Family: More than three times a week    Attends Religious Services: More than 4 times per year    Active  Member of [employer] or Organizations: Yes    Attends Engineer, structural: More than 4 times per year    Marital Status: Married    Tobacco Counseling Counseling given: Yes Tobacco comments: also smoked marijuana,and cocaine (free-base)    Clinical Intake:  Pre-visit preparation completed: Yes  Pain : No/denies pain     BMI - recorded: 29.16 Nutritional Risks: None Diabetes: No  How often do you need to have someone  help you when you read instructions, pamphlets, or other written materials from your doctor or pharmacy?: 1 - Never  Diabetic?No   Interpreter Needed?: No  Information entered by :: [employer] [place]   Activities of Daily Living    08/05/2022    2:28 PM 05/26/2022    6:56 AM  In your present state of health, do you have any difficulty performing the following activities:  Hearing? 0 0  Vision? 0 0  Difficulty concentrating or making decisions? 0 0  Walking or climbing stairs? 0 0  Dressing or bathing? 0 0  Doing errands, shopping? 0   Preparing Food and eating ? N   Using the Toilet? N   In the past six months, have you accidently leaked urine? N   Do you have problems with loss of bowel control? N   Managing your Medications? N   Managing your Finances? N   Housekeeping or managing your Housekeeping? N     Patient Care Team: Georgina Quint, MD as PCP - General (Internal Medicine)  Indicate any recent Medical Services you may have received from other than [facility] providers in the past year (date may be approximate).     Assessment:   This is a routine wellness examination for Chrissie Noa.  Hearing/Vision screen Hearing Screening - Comments:: Denies hearing difficulties   Vision Screening - Comments:: up to date with routine eye exams with  [facility]  Dietary issues and exercise activities discussed: Current Exercise Habits: Home exercise routine, Type of exercise: walking (yard work), Time (Minutes): 60, Frequency (Times/Week):  5, Weekly Exercise (Minutes/Week): 300, Intensity: Moderate   Goals Addressed   None   Depression Screen    08/10/2022    9:50 AM 01/18/2022   10:12 AM 12/22/2021    2:41 PM 01/12/2021    8:14 AM 01/03/2020    9:06 AM 12/18/2018    9:18 AM 01/26/2018    4:18 PM  PHQ 2/9 Scores  PHQ - 2 Score 0 0 0 0 0 0 0    Fall Risk    08/05/2022    2:28 PM 01/18/2022   11:19 AM 01/18/2022   10:12 AM 01/12/2021    8:13 AM 01/03/2020    9:07 AM  Fall Risk   Falls in the past year? 0 0 0 0 0  Number falls in past yr: 0 0 0 0 0  Injury with Fall? 0 0 0 0 0  Risk for fall due to :  No Fall Risks No Fall Risks    Follow up Falls prevention discussed;Falls evaluation completed Falls prevention discussed Falls evaluation completed      FALL RISK PREVENTION PERTAINING TO THE HOME:  Any stairs in or around the home? Yes  If so, are there any without handrails? No  Home free of loose throw rugs in walkways, pet beds, electrical cords, etc? No  Adequate lighting in your home to reduce risk of falls? Yes   ASSISTIVE DEVICES UTILIZED TO PREVENT FALLS:  Life alert? No  Use of a cane, walker or w/c? No  Grab bars in the bathroom? No  Shower chair or bench in shower? Yes  Elevated toilet seat or a handicapped toilet? Yes   TIMED UP AND GO:  Was the test performed?  No televisit .    Cognitive Function:        08/10/2022    9:50 AM 01/18/2022   11:51 AM 01/03/2020    9:07 AM  6CIT Screen  What Year? 0 points 0 points 0 points  What month? 0 points 0 points 0 points  What time? 0 points 0 points 0 points  Count back from 20 0 points 0 points 0 points  Months in reverse 0 points 0 points 0 points  Repeat phrase 0 points 0 points 0 points  Total Score 0 points 0 points 0 points    Immunizations Immunization History  Administered Date(s) Administered   Fluad Quad(high Dose 65+) 12/18/2018, 01/03/2020, 01/12/2021, 12/23/2021   Influenza Split 12/08/2011, 12/29/2021    Influenza,inj,Quad PF,6+ Mos 03/13/2013, 03/13/2014, 11/19/2014, 12/23/2015, 01/20/2017, 01/26/2018   Influenza,inj,quad, With Preservative 12/27/2018   PFIZER(Purple Top)SARS-COV-2 Vaccination 05/18/2019, 06/20/2019, 12/29/2021   Pneumococcal Conjugate-13 03/13/2014   Pneumococcal Polysaccharide-23 12/18/2018   Respiratory Syncytial Virus Vaccine,Recomb Aduvanted(Arexvy) 12/29/2021   Tdap 11/03/2009, 01/12/2021   Zoster Recombinat (Shingrix) 01/08/2020, 03/11/2020    TDAP status: Up to date  Flu Vaccine status: Up to date  Pneumococcal vaccine status: Up to date  Covid-19 vaccine status: Completed vaccines  Qualifies for Shingles Vaccine? Yes   Zostavax completed Yes   Shingrix Completed?: Yes  Screening Tests Health Maintenance  Topic Date Due   COVID-19 Vaccine (4 - 2023-24 season) 08/26/2023 (Originally 02/23/2022)   INFLUENZA VACCINE  10/14/2022   Medicare Annual Wellness (AWV)  08/10/2023   Colonoscopy  05/30/2024   DTaP/Tdap/Td (3 - Td or Tdap) 01/13/2031   Pneumonia Vaccine 66+ Years old  Completed   Hepatitis C Screening  Completed   Zoster Vaccines- Shingrix  Completed   HPV VACCINES  Aged Out    Health Maintenance  There are no preventive care reminders to display for this patient.   Colorectal cancer screening: Type of screening: Colonoscopy. Completed 05/31/19. Repeat every 5 years  Lung Cancer Screening: (Low Dose CT Chest recommended if Age 14-80 years, 30 pack-year currently smoking OR have quit w/in 15years.) does not qualify.   Lung Cancer Screening Referral: No  Additional Screening:  Hepatitis C Screening: does qualify; Completed 11/21/2013  Vision Screening: Recommended annual ophthalmology exams for early detection of glaucoma and other disorders of the eye. Is the patient up to date with their annual eye exam?  Yes  Who is the provider or what is the name of the office in which the patient attends annual eye exams? [facility] If pt is  not established with a provider, would they like to be referred to a provider to establish care? No .   Dental Screening: Recommended annual dental exams for proper oral hygiene  Community Resource Referral / Chronic Care Management: CRR required this visit?  No   CCM required this visit?  No      Plan:     I have personally reviewed and noted the following in the patient's chart:   Medical and social history Use of alcohol, tobacco or illicit drugs  Current medications and supplements including opioid prescriptions. Patient is not currently taking opioid prescriptions. Functional ability and status Nutritional status Physical activity Advanced directives List of other physicians Hospitalizations, surgeries, and ER visits in previous 12 months Vitals Screenings to include cognitive, depression, and falls Referrals and appointments  In addition, I have reviewed and discussed with patient certain preventive protocols, quality metrics, and best practice recommendations. A written personalized care plan for preventive services as well as general preventive health recommendations were provided to patient.     Annabell Sabal, CMA   08/10/2022   Nurse Notes: None

## 2022-08-10 NOTE — Patient Instructions (Signed)
Jerry Freeman , Thank you for taking time to come for your Medicare Wellness Visit. I appreciate your ongoing commitment to your health goals. Please review the following plan we discussed and let me know if I can assist you in the future.   These are the goals we discussed:  Goals      My goal is to continue to stay physically fit.        This is a list of the screening recommended for you and due dates:  Health Maintenance  Topic Date Due   COVID-19 Vaccine (4 - 2023-24 season) 08/26/2023*   Flu Shot  10/14/2022   Medicare Annual Wellness Visit  08/10/2023   Colon Cancer Screening  05/30/2024   DTaP/Tdap/Td vaccine (3 - Td or Tdap) 01/13/2031   Pneumonia Vaccine  Completed   Hepatitis C Screening  Completed   Zoster (Shingles) Vaccine  Completed   HPV Vaccine  Aged Out  *Topic was postponed. The date shown is not the original due date.    Advanced directives: Advance directive discussed with you today. I have provided a copy for you to complete at home and have notarized. Once this is complete please bring a copy in to our office so we can scan it into your chart.   Conditions/risks identified: Aim for 30 minutes of exercise or brisk walking, 6-8 glasses of water, and 5 servings of fruits and vegetables each day.   Next appointment: Follow up in one year for your annual wellness visit. 08/11/2023  Preventive Care 65 Years and Older, Male  Preventive care refers to lifestyle choices and visits with your health care provider that can promote health and wellness. What does preventive care include? A yearly physical exam. This is also called an annual well check. Dental exams once or twice a year. Routine eye exams. Ask your health care provider how often you should have your eyes checked. Personal lifestyle choices, including: Daily care of your teeth and gums. Regular physical activity. Eating a healthy diet. Avoiding tobacco and drug use. Limiting alcohol use. Practicing safe  sex. Taking low doses of aspirin every day. Taking vitamin and mineral supplements as recommended by your health care provider. What happens during an annual well check? The services and screenings done by your health care provider during your annual well check will depend on your age, overall health, lifestyle risk factors, and family history of disease. Counseling  Your health care provider may ask you questions about your: Alcohol use. Tobacco use. Drug use. Emotional well-being. Home and relationship well-being. Sexual activity. Eating habits. History of falls. Memory and ability to understand (cognition). Work and work Astronomer. Screening  You may have the following tests or measurements: Height, weight, and BMI. Blood pressure. Lipid and cholesterol levels. These may be checked every 5 years, or more frequently if you are over 68 years old. Skin check. Lung cancer screening. You may have this screening every year starting at age 55 if you have a 30-pack-year history of smoking and currently smoke or have quit within the past 15 years. Fecal occult blood test (FOBT) of the stool. You may have this test every year starting at age 83. Flexible sigmoidoscopy or colonoscopy. You may have a sigmoidoscopy every 5 years or a colonoscopy every 10 years starting at age 65. Prostate cancer screening. Recommendations will vary depending on your family history and other risks. Hepatitis C blood test. Hepatitis B blood test. Sexually transmitted disease (STD) testing. Diabetes screening. This is done  by checking your blood sugar (glucose) after you have not eaten for a while (fasting). You may have this done every 1-3 years. Abdominal aortic aneurysm (AAA) screening. You may need this if you are a current or former smoker. Osteoporosis. You may be screened starting at age 24 if you are at high risk. Talk with your health care provider about your test results, treatment options, and if  necessary, the need for more tests. Vaccines  Your health care provider may recommend certain vaccines, such as: Influenza vaccine. This is recommended every year. Tetanus, diphtheria, and acellular pertussis (Tdap, Td) vaccine. You may need a Td booster every 10 years. Zoster vaccine. You may need this after age 77. Pneumococcal 13-valent conjugate (PCV13) vaccine. One dose is recommended after age 4. Pneumococcal polysaccharide (PPSV23) vaccine. One dose is recommended after age 2. Talk to your health care provider about which screenings and vaccines you need and how often you need them. This information is not intended to replace advice given to you by your health care provider. Make sure you discuss any questions you have with your health care provider. Document Released: 03/28/2015 Document Revised: 11/19/2015 Document Reviewed: 12/31/2014 Elsevier Interactive Patient Education  2017 [employer].  Fall Prevention in the Home Falls can cause injuries. They can happen to people of all ages. There are many things you can do to make your home safe and to help prevent falls. What can I do on the outside of my home? Regularly fix the edges of walkways and driveways and fix any cracks. Remove anything that might make you trip as you walk through a door, such as a raised step or threshold. Trim any bushes or trees on the path to your home. Use bright outdoor lighting. Clear any walking paths of anything that might make someone trip, such as rocks or tools. Regularly check to see if handrails are loose or broken. Make sure that both sides of any steps have handrails. Any raised decks and porches should have guardrails on the edges. Have any leaves, snow, or ice cleared regularly. Use sand or salt on walking paths during winter. Clean up any spills in your garage right away. This includes oil or grease spills. What can I do in the bathroom? Use night lights. Install grab bars by the toilet  and in the tub and shower. Do not use towel bars as grab bars. Use non-skid mats or decals in the tub or shower. If you need to sit down in the shower, use a plastic, non-slip stool. Keep the floor dry. Clean up any water that spills on the floor as soon as it happens. Remove soap buildup in the tub or shower regularly. Attach bath mats securely with double-sided non-slip rug tape. Do not have throw rugs and other things on the floor that can make you trip. What can I do in the bedroom? Use night lights. Make sure that you have a light by your bed that is easy to reach. Do not use any sheets or blankets that are too big for your bed. They should not hang down onto the floor. Have a firm chair that has side arms. You can use this for support while you get dressed. Do not have throw rugs and other things on the floor that can make you trip. What can I do in the kitchen? Clean up any spills right away. Avoid walking on wet floors. Keep items that you use a lot in easy-to-reach places. If you need to  reach something above you, use a strong step stool that has a grab bar. Keep electrical cords out of the way. Do not use floor polish or wax that makes floors slippery. If you must use wax, use non-skid floor wax. Do not have throw rugs and other things on the floor that can make you trip. What can I do with my stairs? Do not leave any items on the stairs. Make sure that there are handrails on both sides of the stairs and use them. Fix handrails that are broken or loose. Make sure that handrails are as long as the stairways. Check any carpeting to make sure that it is firmly attached to the stairs. Fix any carpet that is loose or worn. Avoid having throw rugs at the top or bottom of the stairs. If you do have throw rugs, attach them to the floor with carpet tape. Make sure that you have a light switch at the top of the stairs and the bottom of the stairs. If you do not have them, ask someone to add  them for you. What else can I do to help prevent falls? Wear shoes that: Do not have high heels. Have rubber bottoms. Are comfortable and fit you well. Are closed at the toe. Do not wear sandals. If you use a stepladder: Make sure that it is fully opened. Do not climb a closed stepladder. Make sure that both sides of the stepladder are locked into place. Ask someone to hold it for you, if possible. Clearly mark and make sure that you can see: Any grab bars or handrails. First and last steps. Where the edge of each step is. Use tools that help you move around (mobility aids) if they are needed. These include: Canes. Walkers. Scooters. Crutches. Turn on the lights when you go into a dark area. Replace any light bulbs as soon as they burn out. Set up your furniture so you have a clear path. Avoid moving your furniture around. If any of your floors are uneven, fix them. If there are any pets around you, be aware of where they are. Review your medicines with your doctor. Some medicines can make you feel dizzy. This can increase your chance of falling. Ask your doctor what other things that you can do to help prevent falls. This information is not intended to replace advice given to you by your health care provider. Make sure you discuss any questions you have with your health care provider. Document Released: 12/26/2008 Document Revised: 08/07/2015 Document Reviewed: 04/05/2014 Elsevier Interactive Patient Education  2017 [employer].

## 2022-09-28 DIAGNOSIS — M2022 Hallux rigidus, left foot: Secondary | ICD-10-CM | POA: Diagnosis not present

## 2022-09-28 DIAGNOSIS — M79675 Pain in left toe(s): Secondary | ICD-10-CM | POA: Diagnosis not present

## 2022-10-01 ENCOUNTER — Telehealth: Payer: Self-pay | Admitting: *Deleted

## 2022-10-01 ENCOUNTER — Telehealth: Payer: Self-pay | Admitting: Emergency Medicine

## 2022-10-01 DIAGNOSIS — E890 Postprocedural hypothyroidism: Secondary | ICD-10-CM

## 2022-10-01 NOTE — Telephone Encounter (Signed)
Caller & Relationship to patient: Jerry Freeman - patient  Call back number: [phone]  Date of last office visit: 01-19-23  Date of next office visit: N/A  Medication(s) to be refilled:  levothyroxine (SYNTHROID) 137 MCG tablet   Preferred Pharmacy: [id number] [place], [place] - [address] Phone: [phone]  Fax: [phone]      PATIENT IS OUT OF MEDICATION AND NEEDS THIS SENT TO LOCAL PHARMACY INSTEAD OF MAIL ORDER

## 2022-10-01 NOTE — Telephone Encounter (Signed)
I connected with Jerry Freeman on 10/01/2022 at 1331 by telephone and verified that I am speaking with the correct person using two identifiers. According to the patient's chart they are due for follow up  with [facility]. Pt scheduled. There are no transportation issues at this time. Nothing further was needed at the end of our conversation.

## 2022-10-04 MED ORDER — LEVOTHYROXINE SODIUM 137 MCG PO TABS: 137.0000 ug | ORAL_TABLET | Freq: Every day | ORAL | 1 refills | Status: DC

## 2022-10-04 NOTE — Telephone Encounter (Signed)
New prescription sent to patient pharmacy.

## 2022-10-11 DIAGNOSIS — M5451 Vertebrogenic low back pain: Secondary | ICD-10-CM | POA: Diagnosis not present

## 2022-10-18 ENCOUNTER — Ambulatory Visit (INDEPENDENT_AMBULATORY_CARE_PROVIDER_SITE_OTHER): Payer: Medicare HMO | Admitting: Emergency Medicine

## 2022-10-18 ENCOUNTER — Encounter: Payer: Self-pay | Admitting: Emergency Medicine

## 2022-10-18 VITALS — BP 120/74 | HR 59 | Temp 98.0°F | Ht 72.0 in | Wt 216.0 lb

## 2022-10-18 DIAGNOSIS — E890 Postprocedural hypothyroidism: Secondary | ICD-10-CM | POA: Diagnosis not present

## 2022-10-18 DIAGNOSIS — E785 Hyperlipidemia, unspecified: Secondary | ICD-10-CM | POA: Diagnosis not present

## 2022-10-18 DIAGNOSIS — I10 Essential (primary) hypertension: Secondary | ICD-10-CM

## 2022-10-18 LAB — CBC WITH DIFFERENTIAL/PLATELET
Basophils Absolute: 0 10*3/uL (ref 0.0–0.1)
Basophils Relative: 0.9 % (ref 0.0–3.0)
Eosinophils Absolute: 0.2 10*3/uL (ref 0.0–0.7)
Eosinophils Relative: 4.9 % (ref 0.0–5.0)
HCT: 45 % (ref 39.0–52.0)
Hemoglobin: 14.3 g/dL (ref 13.0–17.0)
Lymphocytes Relative: 51 % — ABNORMAL HIGH (ref 12.0–46.0)
Lymphs Abs: 2.4 10*3/uL (ref 0.7–4.0)
MCHC: 31.8 g/dL (ref 30.0–36.0)
MCV: 84.3 fl (ref 78.0–100.0)
Monocytes Absolute: 0.5 10*3/uL (ref 0.1–1.0)
Monocytes Relative: 11.5 % (ref 3.0–12.0)
Neutro Abs: 1.5 10*3/uL (ref 1.4–7.7)
Neutrophils Relative %: 31.7 % — ABNORMAL LOW (ref 43.0–77.0)
Platelets: 190 10*3/uL (ref 150.0–400.0)
RBC: 5.34 Mil/uL (ref 4.22–5.81)
RDW: 15.9 % — ABNORMAL HIGH (ref 11.5–15.5)
WBC: 4.6 10*3/uL (ref 4.0–10.5)

## 2022-10-18 LAB — COMPREHENSIVE METABOLIC PANEL
ALT: 30 U/L (ref 0–53)
AST: 28 U/L (ref 0–37)
Albumin: 4.1 g/dL (ref 3.5–5.2)
Alkaline Phosphatase: 52 U/L (ref 39–117)
BUN: 20 mg/dL (ref 6–23)
CO2: 30 mEq/L (ref 19–32)
Calcium: 9.1 mg/dL (ref 8.4–10.5)
Chloride: 101 mEq/L (ref 96–112)
Creatinine, Ser: 1.37 mg/dL (ref 0.40–1.50)
GFR: 52.68 mL/min — ABNORMAL LOW (ref >60.00)
Glucose, Bld: 77 mg/dL (ref 70–99)
Potassium: 4.6 mEq/L (ref 3.5–5.1)
Sodium: 137 mEq/L (ref 135–145)
Total Bilirubin: 0.8 mg/dL (ref 0.2–1.2)
Total Protein: 6.8 g/dL (ref 6.0–8.3)

## 2022-10-18 LAB — LIPID PANEL
Cholesterol: 147 mg/dL (ref 0–200)
HDL: 72.5 mg/dL (ref >39.00)
LDL Cholesterol: 54 mg/dL (ref 0–99)
NonHDL: 74.41
Total CHOL/HDL Ratio: 2
Triglycerides: 103 mg/dL (ref 0.0–149.0)
VLDL: 20.6 mg/dL (ref 0.0–40.0)

## 2022-10-18 LAB — HEMOGLOBIN A1C: Hgb A1c MFr Bld: 5.5 % (ref 4.6–6.5)

## 2022-10-18 LAB — TSH: TSH: 1.5 u[IU]/mL (ref 0.35–5.50)

## 2022-10-18 NOTE — Assessment & Plan Note (Signed)
Clinically euthyroid.  Continue Synthroid 137 mcg daily.  TSH done today. ?

## 2022-10-18 NOTE — Assessment & Plan Note (Signed)
Diet and nutrition discussed Also wondering if he can stop rosuvastatin Lipid profile done today Wants to stop rosuvastatin and see what happens to lipid profile. Will follow-up in 3 to 4 months The 10-year ASCVD risk score (Arnett DK, et al., 2019) is: 13.3%   Values used to calculate the score:     Age: 67 years     Sex: Male     Is Non-Hispanic African American: Yes     Diabetic: No     Tobacco smoker: No     Systolic Blood Pressure: 120 mmHg     Is BP treated: Yes     HDL Cholesterol: 73.3 mg/dL     Total Cholesterol: 137 mg/dL

## 2022-10-18 NOTE — Patient Instructions (Signed)
Hypertension, Adult High blood pressure (hypertension) is when the force of blood pumping through the arteries is too strong. The arteries are the blood vessels that carry blood from the heart throughout the body. Hypertension forces the heart to work harder to pump blood and may cause arteries to become narrow or stiff. Untreated or uncontrolled hypertension can lead to a heart attack, heart failure, a stroke, kidney disease, and other problems. A blood pressure reading consists of a higher number over a lower number. Ideally, your blood pressure should be below 120/80. The first ("top") number is called the systolic pressure. It is a measure of the pressure in your arteries as your heart beats. The second ("bottom") number is called the diastolic pressure. It is a measure of the pressure in your arteries as the heart relaxes. What are the causes? The exact cause of this condition is not known. There are some conditions that result in high blood pressure. What increases the risk? Certain factors may make you more likely to develop high blood pressure. Some of these risk factors are under your control, including: Smoking. Not getting enough exercise or physical activity. Being overweight. Having too much fat, sugar, calories, or salt (sodium) in your diet. Drinking too much alcohol. Other risk factors include: Having a personal history of heart disease, diabetes, high cholesterol, or kidney disease. Stress. Having a family history of high blood pressure and high cholesterol. Having obstructive sleep apnea. Age. The risk increases with age. What are the signs or symptoms? High blood pressure may not cause symptoms. Very high blood pressure (hypertensive crisis) may cause: Headache. Fast or irregular heartbeats (palpitations). Shortness of breath. Nosebleed. Nausea and vomiting. Vision changes. Severe chest pain, dizziness, and seizures. How is this diagnosed? This condition is diagnosed by  measuring your blood pressure while you are seated, with your arm resting on a flat surface, your legs uncrossed, and your feet flat on the floor. The cuff of the blood pressure monitor will be placed directly against the skin of your upper arm at the level of your heart. Blood pressure should be measured at least twice using the same arm. Certain conditions can cause a difference in blood pressure between your right and left arms. If you have a high blood pressure reading during one visit or you have normal blood pressure with other risk factors, you may be asked to: Return on a different day to have your blood pressure checked again. Monitor your blood pressure at home for 1 week or longer. If you are diagnosed with hypertension, you may have other blood or imaging tests to help your health care provider understand your overall risk for other conditions. How is this treated? This condition is treated by making healthy lifestyle changes, such as eating healthy foods, exercising more, and reducing your alcohol intake. You may be referred for counseling on a healthy diet and physical activity. Your health care provider may prescribe medicine if lifestyle changes are not enough to get your blood pressure under control and if: Your systolic blood pressure is above 130. Your diastolic blood pressure is above 80. Your personal target blood pressure may vary depending on your medical conditions, your age, and other factors. Follow these instructions at home: Eating and drinking  Eat a diet that is high in fiber and potassium, and low in sodium, added sugar, and fat. An example of this eating plan is called the DASH diet. DASH stands for Dietary Approaches to Stop Hypertension. To eat this way: Eat   plenty of fresh fruits and vegetables. Try to fill one half of your plate at each meal with fruits and vegetables. Eat whole grains, such as whole-wheat pasta, brown rice, or whole-grain bread. Fill about one  fourth of your plate with whole grains. Eat or drink low-fat dairy products, such as skim milk or low-fat yogurt. Avoid fatty cuts of meat, processed or cured meats, and poultry with skin. Fill about one fourth of your plate with lean proteins, such as fish, chicken without skin, beans, eggs, or tofu. Avoid pre-made and processed foods. These tend to be higher in sodium, added sugar, and fat. Reduce your daily sodium intake. Many people with hypertension should eat less than 1,500 mg of sodium a day. Do not drink alcohol if: Your health care provider tells you not to drink. You are pregnant, may be pregnant, or are planning to become pregnant. If you drink alcohol: Limit how much you have to: 0-1 drink a day for women. 0-2 drinks a day for men. Know how much alcohol is in your drink. In the [place] one drink equals one 12 oz bottle of beer (355 mL), one 5 oz glass of wine (148 mL), or one 1 oz glass of hard liquor (44 mL). Lifestyle  Work with your health care provider to maintain a healthy body weight or to lose weight. Ask what an ideal weight is for you. Get at least 30 minutes of exercise that causes your heart to beat faster (aerobic exercise) most days of the week. Activities may include walking, swimming, or biking. Include exercise to strengthen your muscles (resistance exercise), such as Pilates or lifting weights, as part of your weekly exercise routine. Try to do these types of exercises for 30 minutes at least 3 days a week. Do not use any products that contain nicotine or tobacco. These products include cigarettes, chewing tobacco, and vaping devices, such as e-cigarettes. If you need help quitting, ask your health care provider. Monitor your blood pressure at home as told by your health care provider. Keep all follow-up visits. This is important. Medicines Take over-the-counter and prescription medicines only as told by your health care provider. Follow directions carefully. Blood  pressure medicines must be taken as prescribed. Do not skip doses of blood pressure medicine. Doing this puts you at risk for problems and can make the medicine less effective. Ask your health care provider about side effects or reactions to medicines that you should watch for. Contact a health care provider if you: Think you are having a reaction to a medicine you are taking. Have headaches that keep coming back (recurring). Feel dizzy. Have swelling in your ankles. Have trouble with your vision. Get help right away if you: Develop a severe headache or confusion. Have unusual weakness or numbness. Feel faint. Have severe pain in your chest or abdomen. Vomit repeatedly. Have trouble breathing. These symptoms may be an emergency. Get help right away. Call 911. Do not wait to see if the symptoms will go away. Do not drive yourself to the hospital. Summary Hypertension is when the force of blood pumping through your arteries is too strong. If this condition is not controlled, it may put you at risk for serious complications. Your personal target blood pressure may vary depending on your medical conditions, your age, and other factors. For most people, a normal blood pressure is less than 120/80. Hypertension is treated with lifestyle changes, medicines, or a combination of both. Lifestyle changes include losing weight, eating a healthy,   low-sodium diet, exercising more, and limiting alcohol. This information is not intended to replace advice given to you by your health care provider. Make sure you discuss any questions you have with your health care provider. Document Revised: 01/06/2021 Document Reviewed: 01/06/2021 Elsevier Patient Education  2024 [employer]  

## 2022-10-18 NOTE — Assessment & Plan Note (Signed)
BP Readings from Last 3 Encounters:  10/18/22 120/74  05/26/22 133/80  01/18/22 138/86  Well-controlled hypertension Recommend to continue amlodipine 5 mg daily.  However patient wants to try to get off medication. Recommend to monitor daily blood pressure readings at home for the next several weeks and keep a log.  May try stopping medication for few days as long as he continues monitoring blood pressure readings Cardiovascular risk associated with uncontrolled hypertension discussed Dietary approaches to stop hypertension discussed Benefits of exercise discussed Will follow-up in November as already scheduled

## 2022-10-18 NOTE — Progress Notes (Signed)
Jerry Freeman 67 y.o.   Chief Complaint  Patient presents with   Medical Management of Chronic Issues    F/u appt, no concerns     HISTORY OF PRESENT ILLNESS: This is a 67 y.o. male A1A here for follow-up of hypertension and dyslipidemia.  Also history of thyroid cancer in the past with thyroidectomy.  Takes Synthroid on a daily basis. Staying physically active and eating well.  Non-smoker. Thinks he may not need medications because of a healthy lifestyle and eating better No other complaints or medical concerns today.   HPI   Prior to Admission medications   Medication Sig Start Date End Date Taking? Authorizing Provider  amLODipine (NORVASC) 5 MG tablet TAKE 1 TABLET DAILY 05/25/22  Yes , Eilleen Kempf, MD  Ascorbic Acid (VITAMIN C) 1000 MG tablet Take 1,000 mg by mouth daily.   Yes [provider]  Calcium-Phosphorus-Vitamin D (CITRACAL +D3 PO) Take 1 tablet by mouth daily.   Yes [provider]  Boris Lown Oil 350 MG CAPS Take 350 mg by mouth daily.   Yes [provider]  levocetirizine (XYZAL) 5 MG tablet Take 1 tablet (5 mg total) by mouth every evening. 01/18/22  Yes Georgina Quint, MD  levothyroxine (SYNTHROID) 137 MCG tablet Take 1 tablet (137 mcg total) by mouth daily before breakfast. 10/04/22  Yes , Eilleen Kempf, MD  meloxicam (MOBIC) 7.5 MG tablet TAKE 1 TABLET DAILY AS     NEEDED FOR PAIN 02/09/22  Yes , Eilleen Kempf, MD  Multiple Vitamins-Minerals (MEGA MULTI MEN PO) Take 1 packet by mouth daily. GNC 50 PLUS    Yes [provider]  rosuvastatin (CRESTOR) 10 MG tablet TAKE 1 TABLET DAILY 06/19/22  Yes Georgina Quint, MD    No Known Allergies  Patient Active Problem List   Diagnosis Date Noted   Dyslipidemia 05/26/2021   Essential hypertension 07/24/2020   Environmental allergies 07/24/2020   Chronic cough 07/24/2020   Benign prostatic hyperplasia with urinary obstruction 11/14/2019   DDD (degenerative  disc disease), lumbar 11/20/2013   Family history of colon cancer 12/11/2011   Cancer of thyroid (HCC) 12/11/2011   Hypothyroidism 08/16/2011   ED (erectile dysfunction) 08/16/2011    Past Medical History:  Diagnosis Date   Arthritis    Cancer (HCC)    thyroid    Eczema    Hypertension    Hypothyroidism    Seasonal allergies     Past Surgical History:  Procedure Laterality Date   ADENOIDECTOMY     ARTHRODESIS METATARSALPHALANGEAL JOINT (MTPJ) Left 05/26/2022   Procedure: ARTHRODESIS METATARSALPHALANGEAL JOINT (MTPJ), left 1st;  Surgeon: Netta Cedars, MD;  [facility];  Service: Orthopedics;  Laterality: Left;   HERNIA REPAIR     13 yrs ago   QUADRICEPS TENDON REPAIR Right 09/28/2019   Procedure: REPAIR QUADRICEP TENDON;  Surgeon: Eugenia Mcalpine, MD;  Location: [facility];  Service: Orthopedics;  Laterality: Right;   rupture quad tenden     THYROIDECTOMY     4 or 5 yrs ago   TONSILLECTOMY      Social History   Socioeconomic History   Marital status: Married    Spouse name: Not on file   Number of children: 2   Years of education: 16   Highest education level: Not on file  Occupational History   Occupation: rail roader  Tobacco Use   Smoking status: Former    Current packs/day: 0.00    Types: Cigarettes  Start date: 12/14/1973    Quit date: 12/15/1991    Years since quitting: 30.8   Smokeless tobacco: Never   Tobacco comments:    also smoked marijuana,and cocaine (free-base)   Vaping Use   Vaping status: Never Used  Substance and Sexual Activity   Alcohol use: Not Currently    Alcohol/week: 0.0 standard drinks of alcohol    Comment: hx of 30 years ago    Drug use: Not Currently    Comment: lst 30 years ago    Sexual activity: Not on file  Other Topics Concern   Not on file  Social History Narrative   Exercise cardio and weights 4 days/wk for 1 hour   Social Determinants of Health   Financial Resource Strain: Low Risk   (08/10/2022)   Overall Financial Resource Strain (CARDIA)    Difficulty of Paying Living Expenses: Not hard at all  Food Insecurity: No Food Insecurity (08/10/2022)   Hunger Vital Sign    Worried About Running Out of Food in the Last Year: Never true    Ran Out of Food in the Last Year: Never true  Transportation Needs: No Transportation Needs (10/01/2022)   PRAPARE - Administrator, Civil Service (Medical): No    Lack of Transportation (Non-Medical): No  Physical Activity: Sufficiently Active (08/10/2022)   Exercise Vital Sign    Days of Exercise per Week: 7 days    Minutes of Exercise per Session: 30 min  Stress: No Stress Concern Present (08/10/2022)   [employer] of Occupational Health - Occupational Stress Questionnaire    Feeling of Stress : Not at all  Social Connections: Socially Integrated (08/10/2022)   Social Connection and Isolation Panel [NHANES]    Frequency of Communication with Friends and Family: More than three times a week    Frequency of Social Gatherings with Friends and Family: More than three times a week    Attends Religious Services: More than 4 times per year    Active Member of [employer] or Organizations: Yes    Attends Engineer, structural: More than 4 times per year    Marital Status: Married  Catering manager Violence: Not At Risk (08/10/2022)   Humiliation, Afraid, Rape, and Kick questionnaire    Fear of Current or Ex-Partner: No    Emotionally Abused: No    Physically Abused: No    Sexually Abused: No    Family History  Problem Relation Age of Onset   Cancer Mother        lung   Cancer Father        rectal   High Cholesterol Brother    Hypertension Brother    Stroke Brother    Kidney disease Maternal Grandmother        dialysis   Hypertension Maternal Grandmother    Hyperlipidemia Maternal Grandmother    Arthritis Maternal Grandfather    Allergic rhinitis Neg Hx    Asthma Neg Hx    Eczema Neg Hx    Urticaria Neg Hx       Review of Systems  Constitutional: Negative.  Negative for chills and fever.  HENT: Negative.  Negative for congestion and sore throat.   Respiratory: Negative.  Negative for cough and shortness of breath.   Cardiovascular: Negative.  Negative for chest pain.  Gastrointestinal:  Negative for abdominal pain, diarrhea, nausea and vomiting.  Genitourinary: Negative.  Negative for dysuria and hematuria.  Skin: Negative.  Negative for rash.  Neurological: Negative.  Negative for dizziness and headaches.    Vitals:   10/18/22 0917  BP: 120/74  Pulse: (!) 59  Temp: 98 F (36.7 C)  SpO2: 97%    Physical Exam Vitals reviewed.  Constitutional:      Appearance: Normal appearance.  HENT:     Head: Normocephalic.  Eyes:     Extraocular Movements: Extraocular movements intact.     Conjunctiva/sclera: Conjunctivae normal.     Pupils: Pupils are equal, round, and reactive to light.  Cardiovascular:     Rate and Rhythm: Normal rate and regular rhythm.     Pulses: Normal pulses.     Heart sounds: Normal heart sounds.  Pulmonary:     Effort: Pulmonary effort is normal.     Breath sounds: Normal breath sounds.  Abdominal:     Palpations: Abdomen is soft.     Tenderness: There is no abdominal tenderness.  Musculoskeletal:     Cervical back: No tenderness.  Lymphadenopathy:     Cervical: No cervical adenopathy.  Skin:    General: Skin is warm and dry.     Capillary Refill: Capillary refill takes less than 2 seconds.  Neurological:     General: No focal deficit present.     Mental Status: He is alert and oriented to person, place, and time.  Psychiatric:        Mood and Affect: Mood normal.        Behavior: Behavior normal.      ASSESSMENT & PLAN: A total of 43 minutes was spent with the patient and counseling/coordination of care regarding preparing for this visit, review of most recent office visit notes, review of chronic medical conditions under management,  cardiovascular risks associated with hypertension and dyslipidemia, review of all medications and changes made, education on nutrition, prognosis, documentation and need for follow-up.  Problem List Items Addressed This Visit       Cardiovascular and Mediastinum   Essential hypertension - Primary    BP Readings from Last 3 Encounters:  10/18/22 120/74  05/26/22 133/80  01/18/22 138/86  Well-controlled hypertension Recommend to continue amlodipine 5 mg daily.  However patient wants to try to get off medication. Recommend to monitor daily blood pressure readings at home for the next several weeks and keep a log.  May try stopping medication for few days as long as he continues monitoring blood pressure readings Cardiovascular risk associated with uncontrolled hypertension discussed Dietary approaches to stop hypertension discussed Benefits of exercise discussed Will follow-up in November as already scheduled       Relevant Orders   Comprehensive metabolic panel   CBC with Differential/Platelet     Endocrine   Hypothyroidism    Clinically euthyroid. Continue Synthroid 137 mcg daily TSH done today      Relevant Orders   TSH     Other   Dyslipidemia    Diet and nutrition discussed Also wondering if he can stop rosuvastatin Lipid profile done today Wants to stop rosuvastatin and see what happens to lipid profile. Will follow-up in 3 to 4 months The 10-year ASCVD risk score (Arnett DK, et al., 2019) is: 13.3%   Values used to calculate the score:     Age: 71 years     Sex: Male     Is Non-Hispanic African American: Yes     Diabetic: No     Tobacco smoker: No     Systolic Blood Pressure: 120 mmHg     Is BP treated: Yes  HDL Cholesterol: 73.3 mg/dL     Total Cholesterol: 137 mg/dL       Relevant Orders   Comprehensive metabolic panel   CBC with Differential/Platelet   Hemoglobin A1c   Lipid panel   Patient Instructions  Hypertension, Adult High blood pressure  (hypertension) is when the force of blood pumping through the arteries is too strong. The arteries are the blood vessels that carry blood from the heart throughout the body. Hypertension forces the heart to work harder to pump blood and may cause arteries to become narrow or stiff. Untreated or uncontrolled hypertension can lead to a heart attack, heart failure, a stroke, kidney disease, and other problems. A blood pressure reading consists of a higher number over a lower number. Ideally, your blood pressure should be below 120/80. The first ("top") number is called the systolic pressure. It is a measure of the pressure in your arteries as your heart beats. The second ("bottom") number is called the diastolic pressure. It is a measure of the pressure in your arteries as the heart relaxes. What are the causes? The exact cause of this condition is not known. There are some conditions that result in high blood pressure. What increases the risk? Certain factors may make you more likely to develop high blood pressure. Some of these risk factors are under your control, including: Smoking. Not getting enough exercise or physical activity. Being overweight. Having too much fat, sugar, calories, or salt (sodium) in your diet. Drinking too much alcohol. Other risk factors include: Having a personal history of heart disease, diabetes, high cholesterol, or kidney disease. Stress. Having a family history of high blood pressure and high cholesterol. Having obstructive sleep apnea. Age. The risk increases with age. What are the signs or symptoms? High blood pressure may not cause symptoms. Very high blood pressure (hypertensive crisis) may cause: Headache. Fast or irregular heartbeats (palpitations). Shortness of breath. Nosebleed. Nausea and vomiting. Vision changes. Severe chest pain, dizziness, and seizures. How is this diagnosed? This condition is diagnosed by measuring your blood pressure while you  are seated, with your arm resting on a flat surface, your legs uncrossed, and your feet flat on the floor. The cuff of the blood pressure monitor will be placed directly against the skin of your upper arm at the level of your heart. Blood pressure should be measured at least twice using the same arm. Certain conditions can cause a difference in blood pressure between your right and left arms. If you have a high blood pressure reading during one visit or you have normal blood pressure with other risk factors, you may be asked to: Return on a different day to have your blood pressure checked again. Monitor your blood pressure at home for 1 week or longer. If you are diagnosed with hypertension, you may have other blood or imaging tests to help your health care provider understand your overall risk for other conditions. How is this treated? This condition is treated by making healthy lifestyle changes, such as eating healthy foods, exercising more, and reducing your alcohol intake. You may be referred for counseling on a healthy diet and physical activity. Your health care provider may prescribe medicine if lifestyle changes are not enough to get your blood pressure under control and if: Your systolic blood pressure is above 130. Your diastolic blood pressure is above 80. Your personal target blood pressure may vary depending on your medical conditions, your age, and other factors. Follow these instructions at home: Eating and  drinking  Eat a diet that is high in fiber and potassium, and low in sodium, added sugar, and fat. An example of this eating plan is called the DASH diet. DASH stands for Dietary Approaches to Stop Hypertension. To eat this way: Eat plenty of fresh fruits and vegetables. Try to fill one half of your plate at each meal with fruits and vegetables. Eat whole grains, such as whole-wheat pasta, brown rice, or whole-grain bread. Fill about one fourth of your plate with whole  grains. Eat or drink low-fat dairy products, such as skim milk or low-fat yogurt. Avoid fatty cuts of meat, processed or cured meats, and poultry with skin. Fill about one fourth of your plate with lean proteins, such as fish, chicken without skin, beans, eggs, or tofu. Avoid pre-made and processed foods. These tend to be higher in sodium, added sugar, and fat. Reduce your daily sodium intake. Many people with hypertension should eat less than 1,500 mg of sodium a day. Do not drink alcohol if: Your health care provider tells you not to drink. You are pregnant, may be pregnant, or are planning to become pregnant. If you drink alcohol: Limit how much you have to: 0-1 drink a day for women. 0-2 drinks a day for men. Know how much alcohol is in your drink. In the [place] one drink equals one 12 oz bottle of beer (355 mL), one 5 oz glass of wine (148 mL), or one 1 oz glass of hard liquor (44 mL). Lifestyle  Work with your health care provider to maintain a healthy body weight or to lose weight. Ask what an ideal weight is for you. Get at least 30 minutes of exercise that causes your heart to beat faster (aerobic exercise) most days of the week. Activities may include walking, swimming, or biking. Include exercise to strengthen your muscles (resistance exercise), such as Pilates or lifting weights, as part of your weekly exercise routine. Try to do these types of exercises for 30 minutes at least 3 days a week. Do not use any products that contain nicotine or tobacco. These products include cigarettes, chewing tobacco, and vaping devices, such as e-cigarettes. If you need help quitting, ask your health care provider. Monitor your blood pressure at home as told by your health care provider. Keep all follow-up visits. This is important. Medicines Take over-the-counter and prescription medicines only as told by your health care provider. Follow directions carefully. Blood pressure medicines must be taken  as prescribed. Do not skip doses of blood pressure medicine. Doing this puts you at risk for problems and can make the medicine less effective. Ask your health care provider about side effects or reactions to medicines that you should watch for. Contact a health care provider if you: Think you are having a reaction to a medicine you are taking. Have headaches that keep coming back (recurring). Feel dizzy. Have swelling in your ankles. Have trouble with your vision. Get help right away if you: Develop a severe headache or confusion. Have unusual weakness or numbness. Feel faint. Have severe pain in your chest or abdomen. Vomit repeatedly. Have trouble breathing. These symptoms may be an emergency. Get help right away. Call 911. Do not wait to see if the symptoms will go away. Do not drive yourself to the hospital. Summary Hypertension is when the force of blood pumping through your arteries is too strong. If this condition is not controlled, it may put you at risk for serious complications. Your personal target blood  pressure may vary depending on your medical conditions, your age, and other factors. For most people, a normal blood pressure is less than 120/80. Hypertension is treated with lifestyle changes, medicines, or a combination of both. Lifestyle changes include losing weight, eating a healthy, low-sodium diet, exercising more, and limiting alcohol. This information is not intended to replace advice given to you by your health care provider. Make sure you discuss any questions you have with your health care provider. Document Revised: 01/06/2021 Document Reviewed: 01/06/2021 Elsevier Patient Education  2024 Elsevier Inc.      Edwina Barth, MD [facility]

## 2022-10-19 ENCOUNTER — Encounter: Payer: Self-pay | Admitting: Emergency Medicine

## 2022-10-19 ENCOUNTER — Other Ambulatory Visit: Payer: Self-pay | Admitting: Emergency Medicine

## 2022-10-19 DIAGNOSIS — M5136 Other intervertebral disc degeneration, lumbar region: Secondary | ICD-10-CM

## 2022-10-19 MED ORDER — MELOXICAM 7.5 MG PO TABS: 7.5000 mg | ORAL_TABLET | Freq: Every day | ORAL | 1 refills | Status: AC | PRN

## 2022-10-19 NOTE — Telephone Encounter (Signed)
Recommend to take meloxicam only as needed.  Not on a daily basis.  It is still an NSAID.  New prescription for meloxicam sent to pharmacy of record today.  Thanks.

## 2022-11-05 DIAGNOSIS — M545 Low back pain, unspecified: Secondary | ICD-10-CM | POA: Diagnosis not present

## 2022-11-12 DIAGNOSIS — M545 Low back pain, unspecified: Secondary | ICD-10-CM | POA: Diagnosis not present

## 2022-11-22 DIAGNOSIS — M545 Low back pain, unspecified: Secondary | ICD-10-CM | POA: Diagnosis not present

## 2022-11-29 DIAGNOSIS — M545 Low back pain, unspecified: Secondary | ICD-10-CM | POA: Diagnosis not present

## 2022-12-06 DIAGNOSIS — M545 Low back pain, unspecified: Secondary | ICD-10-CM | POA: Diagnosis not present

## 2022-12-20 DIAGNOSIS — M25532 Pain in left wrist: Secondary | ICD-10-CM | POA: Diagnosis not present

## 2022-12-20 DIAGNOSIS — M25531 Pain in right wrist: Secondary | ICD-10-CM | POA: Diagnosis not present

## 2022-12-22 DIAGNOSIS — M545 Low back pain, unspecified: Secondary | ICD-10-CM | POA: Diagnosis not present

## 2023-01-04 DIAGNOSIS — Z961 Presence of intraocular lens: Secondary | ICD-10-CM | POA: Diagnosis not present

## 2023-01-04 DIAGNOSIS — M545 Low back pain, unspecified: Secondary | ICD-10-CM | POA: Diagnosis not present

## 2023-01-04 DIAGNOSIS — S0500XA Injury of conjunctiva and corneal abrasion without foreign body, unspecified eye, initial encounter: Secondary | ICD-10-CM | POA: Diagnosis not present

## 2023-01-05 DIAGNOSIS — H43811 Vitreous degeneration, right eye: Secondary | ICD-10-CM | POA: Diagnosis not present

## 2023-01-05 DIAGNOSIS — S0500XA Injury of conjunctiva and corneal abrasion without foreign body, unspecified eye, initial encounter: Secondary | ICD-10-CM | POA: Diagnosis not present

## 2023-01-05 DIAGNOSIS — Z961 Presence of intraocular lens: Secondary | ICD-10-CM | POA: Diagnosis not present

## 2023-01-19 ENCOUNTER — Other Ambulatory Visit: Payer: Self-pay | Admitting: Emergency Medicine

## 2023-01-25 ENCOUNTER — Ambulatory Visit (INDEPENDENT_AMBULATORY_CARE_PROVIDER_SITE_OTHER): Payer: Medicare HMO | Admitting: Emergency Medicine

## 2023-01-25 ENCOUNTER — Encounter: Payer: Self-pay | Admitting: Emergency Medicine

## 2023-01-25 VITALS — BP 124/76 | HR 62 | Temp 98.0°F | Ht 72.0 in | Wt 213.2 lb

## 2023-01-25 DIAGNOSIS — Z130 Encounter for screening for diseases of the blood and blood-forming organs and certain disorders involving the immune mechanism: Secondary | ICD-10-CM | POA: Diagnosis not present

## 2023-01-25 DIAGNOSIS — Z125 Encounter for screening for malignant neoplasm of prostate: Secondary | ICD-10-CM

## 2023-01-25 DIAGNOSIS — Z0001 Encounter for general adult medical examination with abnormal findings: Secondary | ICD-10-CM | POA: Diagnosis not present

## 2023-01-25 DIAGNOSIS — N138 Other obstructive and reflux uropathy: Secondary | ICD-10-CM | POA: Diagnosis not present

## 2023-01-25 DIAGNOSIS — Z1329 Encounter for screening for other suspected endocrine disorder: Secondary | ICD-10-CM

## 2023-01-25 DIAGNOSIS — Z23 Encounter for immunization: Secondary | ICD-10-CM

## 2023-01-25 DIAGNOSIS — Z13228 Encounter for screening for other metabolic disorders: Secondary | ICD-10-CM

## 2023-01-25 DIAGNOSIS — N401 Enlarged prostate with lower urinary tract symptoms: Secondary | ICD-10-CM | POA: Diagnosis not present

## 2023-01-25 DIAGNOSIS — E785 Hyperlipidemia, unspecified: Secondary | ICD-10-CM

## 2023-01-25 DIAGNOSIS — E890 Postprocedural hypothyroidism: Secondary | ICD-10-CM

## 2023-01-25 DIAGNOSIS — I10 Essential (primary) hypertension: Secondary | ICD-10-CM | POA: Diagnosis not present

## 2023-01-25 LAB — LIPID PANEL
Cholesterol: 153 mg/dL (ref 0–200)
HDL: 72.4 mg/dL (ref >39.00)
LDL Cholesterol: 69 mg/dL (ref 0–99)
NonHDL: 80.36
Total CHOL/HDL Ratio: 2
Triglycerides: 56 mg/dL (ref 0.0–149.0)
VLDL: 11.2 mg/dL (ref 0.0–40.0)

## 2023-01-25 LAB — CBC WITH DIFFERENTIAL/PLATELET
Basophils Absolute: 0 10*3/uL (ref 0.0–0.1)
Basophils Relative: 0.7 % (ref 0.0–3.0)
Eosinophils Absolute: 0.5 10*3/uL (ref 0.0–0.7)
Eosinophils Relative: 12.3 % — ABNORMAL HIGH (ref 0.0–5.0)
HCT: 42.2 % (ref 39.0–52.0)
Hemoglobin: 14.1 g/dL (ref 13.0–17.0)
Lymphocytes Relative: 41.3 % (ref 12.0–46.0)
Lymphs Abs: 1.6 10*3/uL (ref 0.7–4.0)
MCHC: 33.4 g/dL (ref 30.0–36.0)
MCV: 82.4 fL (ref 78.0–100.0)
Monocytes Absolute: 0.4 10*3/uL (ref 0.1–1.0)
Monocytes Relative: 11.3 % (ref 3.0–12.0)
Neutro Abs: 1.4 10*3/uL (ref 1.4–7.7)
Neutrophils Relative %: 34.4 % — ABNORMAL LOW (ref 43.0–77.0)
Platelets: 187 10*3/uL (ref 150.0–400.0)
RBC: 5.12 Mil/uL (ref 4.22–5.81)
RDW: 15.5 % (ref 11.5–15.5)
WBC: 3.9 10*3/uL — ABNORMAL LOW (ref 4.0–10.5)

## 2023-01-25 LAB — COMPREHENSIVE METABOLIC PANEL
ALT: 28 U/L (ref 0–53)
AST: 35 U/L (ref 0–37)
Albumin: 4.2 g/dL (ref 3.5–5.2)
Alkaline Phosphatase: 54 U/L (ref 39–117)
BUN: 21 mg/dL (ref 6–23)
CO2: 29 meq/L (ref 19–32)
Calcium: 9 mg/dL (ref 8.4–10.5)
Chloride: 104 meq/L (ref 96–112)
Creatinine, Ser: 1.15 mg/dL (ref 0.40–1.50)
GFR: 64.87 mL/min (ref >60.00)
Glucose, Bld: 79 mg/dL (ref 70–99)
Potassium: 4.6 meq/L (ref 3.5–5.1)
Sodium: 138 meq/L (ref 135–145)
Total Bilirubin: 0.8 mg/dL (ref 0.2–1.2)
Total Protein: 6.7 g/dL (ref 6.0–8.3)

## 2023-01-25 LAB — TSH: TSH: 0.38 u[IU]/mL (ref 0.35–5.50)

## 2023-01-25 LAB — HEMOGLOBIN A1C: Hgb A1c MFr Bld: 5.7 % (ref 4.6–6.5)

## 2023-01-25 LAB — PSA: PSA: 1.7 ng/mL (ref 0.10–4.00)

## 2023-01-25 NOTE — Assessment & Plan Note (Signed)
Well-controlled hypertension Continue amlodipine 5 mg daily Cardiovascular risks associated with hypertension discussed Benefits of exercise discussed Dietary approaches to stop hypertension discussed

## 2023-01-25 NOTE — Assessment & Plan Note (Signed)
Chronic stable condition Lipid profile done today. Continues rosuvastatin 10 mg daily Diet and nutrition discussed The 10-year ASCVD risk score (Arnett DK, et al., 2019) is: 14.4%   Values used to calculate the score:     Age: 67 years     Sex: Male     Is Non-Hispanic African American: Yes     Diabetic: No     Tobacco smoker: No     Systolic Blood Pressure: 124 mmHg     Is BP treated: Yes     HDL Cholesterol: 72.5 mg/dL     Total Cholesterol: 147 mg/dL

## 2023-01-25 NOTE — Assessment & Plan Note (Signed)
Still having lower urinary tract symptoms such as frequency

## 2023-01-25 NOTE — Patient Instructions (Signed)
Health Maintenance, Male Adopting a healthy lifestyle and getting preventive care are important in promoting health and wellness. Ask your health care provider about: The right schedule for you to have regular tests and exams. Things you can do on your own to prevent diseases and keep yourself healthy. What should I know about diet, weight, and exercise? Eat a healthy diet  Eat a diet that includes plenty of vegetables, fruits, low-fat dairy products, and lean protein. Do not eat a lot of foods that are high in solid fats, added sugars, or sodium. Maintain a healthy weight Body mass index (BMI) is a measurement that can be used to identify possible weight problems. It estimates body fat based on height and weight. Your health care provider can help determine your BMI and help you achieve or maintain a healthy weight. Get regular exercise Get regular exercise. This is one of the most important things you can do for your health. Most adults should: Exercise for at least 150 minutes each week. The exercise should increase your heart rate and make you sweat (moderate-intensity exercise). Do strengthening exercises at least twice a week. This is in addition to the moderate-intensity exercise. Spend less time sitting. Even light physical activity can be beneficial. Watch cholesterol and blood lipids Have your blood tested for lipids and cholesterol at 69 years of age, then have this test every 5 years. You may need to have your cholesterol levels checked more often if: Your lipid or cholesterol levels are high. You are older than 69 years of age. You are at high risk for heart disease. What should I know about cancer screening? Many types of cancers can be detected early and may often be prevented. Depending on your health history and family history, you may need to have cancer screening at various ages. This may include screening for: Colorectal cancer. Prostate cancer. Skin cancer. Lung  cancer. What should I know about heart disease, diabetes, and high blood pressure? Blood pressure and heart disease High blood pressure causes heart disease and increases the risk of stroke. This is more likely to develop in people who have high blood pressure readings or are overweight. Talk with your health care provider about your target blood pressure readings. Have your blood pressure checked: Every 3-5 years if you are 18-39 years of age. Every year if you are 40 years old or older. If you are between the ages of 65 and 75 and are a current or former smoker, ask your health care provider if you should have a one-time screening for abdominal aortic aneurysm (AAA). Diabetes Have regular diabetes screenings. This checks your fasting blood sugar level. Have the screening done: Once every three years after age 45 if you are at a normal weight and have a low risk for diabetes. More often and at a younger age if you are overweight or have a high risk for diabetes. What should I know about preventing infection? Hepatitis B If you have a higher risk for hepatitis B, you should be screened for this virus. Talk with your health care provider to find out if you are at risk for hepatitis B infection. Hepatitis C Blood testing is recommended for: Everyone born from 1945 through 1965. Anyone with known risk factors for hepatitis C. Sexually transmitted infections (STIs) You should be screened each year for STIs, including gonorrhea and chlamydia, if: You are sexually active and are younger than 69 years of age. You are older than 69 years of age and your   health care provider tells you that you are at risk for this type of infection. Your sexual activity has changed since you were last screened, and you are at increased risk for chlamydia or gonorrhea. Ask your health care provider if you are at risk. Ask your health care provider about whether you are at high risk for HIV. Your health care provider  may recommend a prescription medicine to help prevent HIV infection. If you choose to take medicine to prevent HIV, you should first get tested for HIV. You should then be tested every 3 months for as long as you are taking the medicine. Follow these instructions at home: Alcohol use Do not drink alcohol if your health care provider tells you not to drink. If you drink alcohol: Limit how much you have to 0-2 drinks a day. Know how much alcohol is in your drink. In the [place] one drink equals one 12 oz bottle of beer (355 mL), one 5 oz glass of wine (148 mL), or one 1 oz glass of hard liquor (44 mL). Lifestyle Do not use any products that contain nicotine or tobacco. These products include cigarettes, chewing tobacco, and vaping devices, such as e-cigarettes. If you need help quitting, ask your health care provider. Do not use street drugs. Do not share needles. Ask your health care provider for help if you need support or information about quitting drugs. General instructions Schedule regular health, dental, and eye exams. Stay current with your vaccines. Tell your health care provider if: You often feel depressed. You have ever been abused or do not feel safe at home. Summary Adopting a healthy lifestyle and getting preventive care are important in promoting health and wellness. Follow your health care provider's instructions about healthy diet, exercising, and getting tested or screened for diseases. Follow your health care provider's instructions on monitoring your cholesterol and blood pressure. This information is not intended to replace advice given to you by your health care provider. Make sure you discuss any questions you have with your health care provider. Document Revised: 07/21/2020 Document Reviewed: 07/21/2020 Elsevier Patient Education  2024 Elsevier Inc.  

## 2023-01-25 NOTE — Assessment & Plan Note (Signed)
Clinically euthyroid.  Continue Synthroid 137 mcg daily.  TSH done today. ?

## 2023-01-25 NOTE — Progress Notes (Signed)
Jerry Freeman 67 y.o.   Chief Complaint  Patient presents with   Annual Exam    No concerns     HISTORY OF PRESENT ILLNESS: This is a 67 y.o. male here for annual exam and follow-up on chronic medical conditions including hypertension and dyslipidemia Overall doing well. No complaints or medical concerns today.  HPI   Prior to Admission medications   Medication Sig Start Date End Date Taking? Authorizing Provider  amLODipine (NORVASC) 5 MG tablet TAKE 1 TABLET DAILY 05/25/22  Yes Iyesha Such, Eilleen Kempf, MD  Ascorbic Acid (VITAMIN C) 1000 MG tablet Take 1,000 mg by mouth daily.   Yes [provider]  Calcium-Phosphorus-Vitamin D (CITRACAL +D3 PO) Take 1 tablet by mouth daily.   Yes [provider]  Boris Lown Oil 350 MG CAPS Take 350 mg by mouth daily.   Yes [provider]  levocetirizine (XYZAL) 5 MG tablet TAKE 1 TABLET BY MOUTH EVERY DAY IN THE EVENING 01/19/23  Yes Zaylyn Bergdoll, Eilleen Kempf, MD  levothyroxine (SYNTHROID) 137 MCG tablet Take 1 tablet (137 mcg total) by mouth daily before breakfast. 10/04/22  Yes Emerson Schreifels, Eilleen Kempf, MD  meloxicam (MOBIC) 7.5 MG tablet Take 1 tablet (7.5 mg total) by mouth daily as needed for pain. 10/19/22  Yes Saman Umstead, Eilleen Kempf, MD  Multiple Vitamins-Minerals (MEGA MULTI MEN PO) Take 1 packet by mouth daily. GNC 50 PLUS    Yes [provider]  rosuvastatin (CRESTOR) 10 MG tablet TAKE 1 TABLET DAILY 06/19/22  Yes Georgina Quint, MD    No Known Allergies  Patient Active Problem List   Diagnosis Date Noted   Dyslipidemia 05/26/2021   Essential hypertension 07/24/2020   Environmental allergies 07/24/2020   Chronic cough 07/24/2020   Benign prostatic hyperplasia with urinary obstruction 11/14/2019   DDD (degenerative disc disease), lumbar 11/20/2013   Family history of colon cancer 12/11/2011   Cancer of thyroid (HCC) 12/11/2011   Hypothyroidism 08/16/2011   ED (erectile dysfunction) 08/16/2011    Past  Medical History:  Diagnosis Date   Arthritis    Cancer (HCC)    thyroid    Eczema    Hypertension    Hypothyroidism    Seasonal allergies     Past Surgical History:  Procedure Laterality Date   ADENOIDECTOMY     ARTHRODESIS METATARSALPHALANGEAL JOINT (MTPJ) Left 05/26/2022   Procedure: ARTHRODESIS METATARSALPHALANGEAL JOINT (MTPJ), left 1st;  Surgeon: Netta Cedars, MD;  Location: [facility];  Service: Orthopedics;  Laterality: Left;   HERNIA REPAIR     13 yrs ago   QUADRICEPS TENDON REPAIR Right 09/28/2019   Procedure: REPAIR QUADRICEP TENDON;  Surgeon: Eugenia Mcalpine, MD;  Location: [facility];  Service: Orthopedics;  Laterality: Right;   rupture quad tenden     THYROIDECTOMY     4 or 5 yrs ago   TONSILLECTOMY      Social History   Socioeconomic History   Marital status: Married    Spouse name: Not on file   Number of children: 2   Years of education: 16   Highest education level: Bachelor's degree (e.g., BA, AB, BS)  Occupational History   Occupation: Risk manager  Tobacco Use   Smoking status: Former    Current packs/day: 0.00    Types: Cigarettes    Start date: 12/14/1973    Quit date: 12/15/1991    Years since quitting: 31.1   Smokeless tobacco: Never   Tobacco comments:    also smoked marijuana,and cocaine (free-base)  Vaping Use   Vaping status: Never Used  Substance and Sexual Activity   Alcohol use: Not Currently    Alcohol/week: 0.0 standard drinks of alcohol    Comment: hx of 30 years ago    Drug use: Not Currently    Comment: lst 30 years ago    Sexual activity: Not on file  Other Topics Concern   Not on file  Social History Narrative   Exercise cardio and weights 4 days/wk for 1 hour   Social Determinants of Health   Financial Resource Strain: Low Risk  (01/24/2023)   Overall Financial Resource Strain (CARDIA)    Difficulty of Paying Living Expenses: Not hard at all  Food Insecurity: No Food Insecurity (01/24/2023)   Hunger  Vital Sign    Worried About Running Out of Food in the Last Year: Never true    Ran Out of Food in the Last Year: Never true  Transportation Needs: No Transportation Needs (01/24/2023)   PRAPARE - Administrator, Civil Service (Medical): No    Lack of Transportation (Non-Medical): No  Physical Activity: Sufficiently Active (01/24/2023)   Exercise Vital Sign    Days of Exercise per Week: 5 days    Minutes of Exercise per Session: 90 min  Stress: No Stress Concern Present (01/24/2023)   [employer] of Occupational Health - Occupational Stress Questionnaire    Feeling of Stress : Not at all  Social Connections: Socially Integrated (01/24/2023)   Social Connection and Isolation Panel [NHANES]    Frequency of Communication with Friends and Family: More than three times a week    Frequency of Social Gatherings with Friends and Family: More than three times a week    Attends Religious Services: More than 4 times per year    Active Member of [employer] or Organizations: Yes    Attends Engineer, structural: More than 4 times per year    Marital Status: Married  Catering manager Violence: Not At Risk (08/10/2022)   Humiliation, Afraid, Rape, and Kick questionnaire    Fear of Current or Ex-Partner: No    Emotionally Abused: No    Physically Abused: No    Sexually Abused: No    Family History  Problem Relation Age of Onset   Cancer Mother        lung   Cancer Father        rectal   High Cholesterol Brother    Hypertension Brother    Stroke Brother    Kidney disease Maternal Grandmother        dialysis   Hypertension Maternal Grandmother    Hyperlipidemia Maternal Grandmother    Arthritis Maternal Grandfather    Allergic rhinitis Neg Hx    Asthma Neg Hx    Eczema Neg Hx    Urticaria Neg Hx      Review of Systems  Constitutional: Negative.  Negative for chills and fever.  HENT: Negative.  Negative for congestion and sore throat.   Respiratory: Negative.   Negative for cough and shortness of breath.   Cardiovascular: Negative.  Negative for chest pain and palpitations.  Gastrointestinal:  Negative for abdominal pain, diarrhea, nausea and vomiting.  Genitourinary: Negative.  Negative for dysuria and hematuria.  Skin: Negative.  Negative for rash.  Neurological: Negative.  Negative for dizziness and headaches.  All other systems reviewed and are negative.   Vitals:   01/25/23 0926  BP: 124/76  Pulse: 62  Temp: 98 F (36.7 C)  SpO2: 98%    Physical Exam Vitals reviewed.  Constitutional:      Appearance: Normal appearance.  HENT:     Head: Normocephalic.     Right Ear: Tympanic membrane, ear canal and external ear normal.     Left Ear: Tympanic membrane, ear canal and external ear normal.     Mouth/Throat:     Mouth: Mucous membranes are moist.     Pharynx: Oropharynx is clear.  Eyes:     Extraocular Movements: Extraocular movements intact.     Pupils: Pupils are equal, round, and reactive to light.  Cardiovascular:     Rate and Rhythm: Normal rate and regular rhythm.     Pulses: Normal pulses.     Heart sounds: Normal heart sounds.  Pulmonary:     Effort: Pulmonary effort is normal.     Breath sounds: Normal breath sounds.  Abdominal:     Palpations: Abdomen is soft.     Tenderness: There is no abdominal tenderness.  Musculoskeletal:     Cervical back: No tenderness.     Right lower leg: No edema.     Left lower leg: No edema.  Lymphadenopathy:     Cervical: No cervical adenopathy.  Skin:    General: Skin is warm and dry.     Capillary Refill: Capillary refill takes less than 2 seconds.  Neurological:     General: No focal deficit present.     Mental Status: He is alert and oriented to person, place, and time.  Psychiatric:        Mood and Affect: Mood normal.        Behavior: Behavior normal.      ASSESSMENT & PLAN: Problem List Items Addressed This Visit       Cardiovascular and Mediastinum   Essential  hypertension    Well-controlled hypertension Continue amlodipine 5 mg daily Cardiovascular risks associated with hypertension discussed Benefits of exercise discussed Dietary approaches to stop hypertension discussed       Relevant Orders   Comprehensive metabolic panel     Endocrine   Hypothyroidism    Clinically euthyroid. Continue Synthroid 137 mcg daily TSH done today      Relevant Orders   TSH     Genitourinary   Benign prostatic hyperplasia with urinary obstruction    Still having lower urinary tract symptoms such as frequency        Other   Dyslipidemia    Chronic stable condition Lipid profile done today. Continues rosuvastatin 10 mg daily Diet and nutrition discussed The 10-year ASCVD risk score (Arnett DK, et al., 2019) is: 14.4%   Values used to calculate the score:     Age: 52 years     Sex: Male     Is Non-Hispanic African American: Yes     Diabetic: No     Tobacco smoker: No     Systolic Blood Pressure: 124 mmHg     Is BP treated: Yes     HDL Cholesterol: 72.5 mg/dL     Total Cholesterol: 147 mg/dL       Relevant Orders   Lipid panel   Other Visit Diagnoses     Encounter for general adult medical examination with abnormal findings    -  Primary   Relevant Orders   CBC with Differential   Comprehensive metabolic panel   Hemoglobin A1c   Lipid panel   PSA(Must document that pt has been informed of limitations of PSA testing.)   Screening  for prostate cancer       Relevant Orders   PSA(Must document that pt has been informed of limitations of PSA testing.)   Screening for deficiency anemia       Relevant Orders   CBC with Differential   Screening for endocrine, metabolic and immunity disorder       Relevant Orders   Hemoglobin A1c   Need for vaccination       Relevant Orders   Flu Vaccine Trivalent High Dose (Fluad)      Modifiable risk factors discussed with patient. Anticipatory guidance according to age provided. The following  topics were also discussed: Social Determinants of Health Smoking.  Non-smoker Diet and nutrition Benefits of exercise Cancer screening and review of most recent colonoscopy report from 2021 Vaccinations review and recommendations Cardiovascular risk assessment and need for blood work The 10-year ASCVD risk score (Arnett DK, et al., 2019) is: 14.4%   Values used to calculate the score:     Age: 53 years     Sex: Male     Is Non-Hispanic African American: Yes     Diabetic: No     Tobacco smoker: No     Systolic Blood Pressure: 124 mmHg     Is BP treated: Yes     HDL Cholesterol: 72.5 mg/dL     Total Cholesterol: 147 mg/dL Review of multiple chronic medical conditions Review of all medications Mental health including depression and anxiety Fall and accident prevention  Patient Instructions  Health Maintenance, Male Adopting a healthy lifestyle and getting preventive care are important in promoting health and wellness. Ask your health care provider about: The right schedule for you to have regular tests and exams. Things you can do on your own to prevent diseases and keep yourself healthy. What should I know about diet, weight, and exercise? Eat a healthy diet  Eat a diet that includes plenty of vegetables, fruits, low-fat dairy products, and lean protein. Do not eat a lot of foods that are high in solid fats, added sugars, or sodium. Maintain a healthy weight Body mass index (BMI) is a measurement that can be used to identify possible weight problems. It estimates body fat based on height and weight. Your health care provider can help determine your BMI and help you achieve or maintain a healthy weight. Get regular exercise Get regular exercise. This is one of the most important things you can do for your health. Most adults should: Exercise for at least 150 minutes each week. The exercise should increase your heart rate and make you sweat (moderate-intensity exercise). Do  strengthening exercises at least twice a week. This is in addition to the moderate-intensity exercise. Spend less time sitting. Even light physical activity can be beneficial. Watch cholesterol and blood lipids Have your blood tested for lipids and cholesterol at 69 years of age, then have this test every 5 years. You may need to have your cholesterol levels checked more often if: Your lipid or cholesterol levels are high. You are older than 69 years of age. You are at high risk for heart disease. What should I know about cancer screening? Many types of cancers can be detected early and may often be prevented. Depending on your health history and family history, you may need to have cancer screening at various ages. This may include screening for: Colorectal cancer. Prostate cancer. Skin cancer. Lung cancer. What should I know about heart disease, diabetes, and high blood pressure? Blood pressure and heart disease  High blood pressure causes heart disease and increases the risk of stroke. This is more likely to develop in people who have high blood pressure readings or are overweight. Talk with your health care provider about your target blood pressure readings. Have your blood pressure checked: Every 3-5 years if you are 53-98 years of age. Every year if you are 27 years old or older. If you are between the ages of 36 and 66 and are a current or former smoker, ask your health care provider if you should have a one-time screening for abdominal aortic aneurysm (AAA). Diabetes Have regular diabetes screenings. This checks your fasting blood sugar level. Have the screening done: Once every three years after age 66 if you are at a normal weight and have a low risk for diabetes. More often and at a younger age if you are overweight or have a high risk for diabetes. What should I know about preventing infection? Hepatitis B If you have a higher risk for hepatitis B, you should be screened for  this virus. Talk with your health care provider to find out if you are at risk for hepatitis B infection. Hepatitis C Blood testing is recommended for: Everyone born from [birth year] through 1967. Anyone with known risk factors for hepatitis C. Sexually transmitted infections (STIs) You should be screened each year for STIs, including gonorrhea and chlamydia, if: You are sexually active and are younger than 69 years of age. You are older than 69 years of age and your health care provider tells you that you are at risk for this type of infection. Your sexual activity has changed since you were last screened, and you are at increased risk for chlamydia or gonorrhea. Ask your health care provider if you are at risk. Ask your health care provider about whether you are at high risk for HIV. Your health care provider may recommend a prescription medicine to help prevent HIV infection. If you choose to take medicine to prevent HIV, you should first get tested for HIV. You should then be tested every 3 months for as long as you are taking the medicine. Follow these instructions at home: Alcohol use Do not drink alcohol if your health care provider tells you not to drink. If you drink alcohol: Limit how much you have to 0-2 drinks a day. Know how much alcohol is in your drink. In the [place] one drink equals one 12 oz bottle of beer (355 mL), one 5 oz glass of wine (148 mL), or one 1 oz glass of hard liquor (44 mL). Lifestyle Do not use any products that contain nicotine or tobacco. These products include cigarettes, chewing tobacco, and vaping devices, such as e-cigarettes. If you need help quitting, ask your health care provider. Do not use street drugs. Do not share needles. Ask your health care provider for help if you need support or information about quitting drugs. General instructions Schedule regular health, dental, and eye exams. Stay current with your vaccines. Tell your health care provider  if: You often feel depressed. You have ever been abused or do not feel safe at home. Summary Adopting a healthy lifestyle and getting preventive care are important in promoting health and wellness. Follow your health care provider's instructions about healthy diet, exercising, and getting tested or screened for diseases. Follow your health care provider's instructions on monitoring your cholesterol and blood pressure. This information is not intended to replace advice given to you by your health care provider. Make sure you  discuss any questions you have with your health care provider. Document Revised: 07/21/2020 Document Reviewed: 07/21/2020 Elsevier Patient Education  2024 Elsevier Inc.     Edwina Barth, MD [facility]

## 2023-01-28 DIAGNOSIS — M2022 Hallux rigidus, left foot: Secondary | ICD-10-CM | POA: Diagnosis not present

## 2023-02-14 DIAGNOSIS — Z961 Presence of intraocular lens: Secondary | ICD-10-CM | POA: Diagnosis not present

## 2023-02-14 DIAGNOSIS — S0500XD Injury of conjunctiva and corneal abrasion without foreign body, unspecified eye, subsequent encounter: Secondary | ICD-10-CM | POA: Diagnosis not present

## 2023-02-14 DIAGNOSIS — H43811 Vitreous degeneration, right eye: Secondary | ICD-10-CM | POA: Diagnosis not present

## 2023-04-25 DIAGNOSIS — S0500XD Injury of conjunctiva and corneal abrasion without foreign body, unspecified eye, subsequent encounter: Secondary | ICD-10-CM | POA: Diagnosis not present

## 2023-04-25 DIAGNOSIS — H43811 Vitreous degeneration, right eye: Secondary | ICD-10-CM | POA: Diagnosis not present

## 2023-04-25 DIAGNOSIS — Z961 Presence of intraocular lens: Secondary | ICD-10-CM | POA: Diagnosis not present

## 2023-04-26 ENCOUNTER — Other Ambulatory Visit: Payer: Self-pay | Admitting: Emergency Medicine

## 2023-04-26 DIAGNOSIS — N138 Other obstructive and reflux uropathy: Secondary | ICD-10-CM

## 2023-05-27 ENCOUNTER — Other Ambulatory Visit: Payer: Self-pay | Admitting: Emergency Medicine

## 2023-05-27 DIAGNOSIS — I10 Essential (primary) hypertension: Secondary | ICD-10-CM

## 2023-06-08 ENCOUNTER — Encounter: Payer: Self-pay | Admitting: Emergency Medicine

## 2023-06-08 NOTE — Telephone Encounter (Signed)
 Needs office visit.

## 2023-06-11 ENCOUNTER — Other Ambulatory Visit: Payer: Self-pay | Admitting: Emergency Medicine

## 2023-06-11 DIAGNOSIS — E785 Hyperlipidemia, unspecified: Secondary | ICD-10-CM

## 2023-06-13 ENCOUNTER — Encounter: Payer: Self-pay | Admitting: Emergency Medicine

## 2023-06-13 ENCOUNTER — Other Ambulatory Visit: Payer: Self-pay | Admitting: Emergency Medicine

## 2023-06-13 ENCOUNTER — Ambulatory Visit (INDEPENDENT_AMBULATORY_CARE_PROVIDER_SITE_OTHER): Admitting: Emergency Medicine

## 2023-06-13 VITALS — BP 130/78 | HR 58 | Temp 97.8°F | Ht 72.0 in | Wt 209.0 lb

## 2023-06-13 DIAGNOSIS — E890 Postprocedural hypothyroidism: Secondary | ICD-10-CM

## 2023-06-13 DIAGNOSIS — E785 Hyperlipidemia, unspecified: Secondary | ICD-10-CM

## 2023-06-13 DIAGNOSIS — I10 Essential (primary) hypertension: Secondary | ICD-10-CM | POA: Diagnosis not present

## 2023-06-13 LAB — CBC WITH DIFFERENTIAL/PLATELET
Basophils Absolute: 0 10*3/uL (ref 0.0–0.1)
Basophils Relative: 0.6 % (ref 0.0–3.0)
Eosinophils Absolute: 0.6 10*3/uL (ref 0.0–0.7)
Eosinophils Relative: 13.6 % — ABNORMAL HIGH (ref 0.0–5.0)
HCT: 43.4 % (ref 39.0–52.0)
Hemoglobin: 14.2 g/dL (ref 13.0–17.0)
Lymphocytes Relative: 39.8 % (ref 12.0–46.0)
Lymphs Abs: 1.8 10*3/uL (ref 0.7–4.0)
MCHC: 32.7 g/dL (ref 30.0–36.0)
MCV: 82.1 fl (ref 78.0–100.0)
Monocytes Absolute: 0.5 10*3/uL (ref 0.1–1.0)
Monocytes Relative: 10.2 % (ref 3.0–12.0)
Neutro Abs: 1.6 10*3/uL (ref 1.4–7.7)
Neutrophils Relative %: 35.8 % — ABNORMAL LOW (ref 43.0–77.0)
Platelets: 189 10*3/uL (ref 150.0–400.0)
RBC: 5.29 Mil/uL (ref 4.22–5.81)
RDW: 15.7 % — ABNORMAL HIGH (ref 11.5–15.5)
WBC: 4.5 10*3/uL (ref 4.0–10.5)

## 2023-06-13 LAB — COMPREHENSIVE METABOLIC PANEL WITH GFR
ALT: 25 U/L (ref 0–53)
AST: 31 U/L (ref 0–37)
Albumin: 4.1 g/dL (ref 3.5–5.2)
Alkaline Phosphatase: 55 U/L (ref 39–117)
BUN: 20 mg/dL (ref 6–23)
CO2: 29 meq/L (ref 19–32)
Calcium: 9.2 mg/dL (ref 8.4–10.5)
Chloride: 103 meq/L (ref 96–112)
Creatinine, Ser: 1.17 mg/dL (ref 0.40–1.50)
GFR: 63.37 mL/min (ref >60.00)
Glucose, Bld: 87 mg/dL (ref 70–99)
Potassium: 4.7 meq/L (ref 3.5–5.1)
Sodium: 138 meq/L (ref 135–145)
Total Bilirubin: 0.8 mg/dL (ref 0.2–1.2)
Total Protein: 7 g/dL (ref 6.0–8.3)

## 2023-06-13 LAB — LIPID PANEL
Cholesterol: 138 mg/dL (ref 0–200)
HDL: 67.1 mg/dL (ref >39.00)
LDL Cholesterol: 54 mg/dL (ref 0–99)
NonHDL: 70.87
Total CHOL/HDL Ratio: 2
Triglycerides: 84 mg/dL (ref 0.0–149.0)
VLDL: 16.8 mg/dL (ref 0.0–40.0)

## 2023-06-13 LAB — TSH: TSH: 0.11 u[IU]/mL — ABNORMAL LOW (ref 0.35–5.50)

## 2023-06-13 MED ORDER — LEVOTHYROXINE SODIUM 125 MCG PO TABS: 125.0000 ug | ORAL_TABLET | Freq: Every day | ORAL | 3 refills | Status: AC

## 2023-06-13 NOTE — Assessment & Plan Note (Signed)
 Follicular carcinoma (Hurthle cell characteristics noted on right thyroid lobectomy performed in May 2007). Completion of thyroidectomy performed August 2007 with reimplantation of left inferior parathyroid gland. Now on chronic thyroid hormone replacement. Clinically euthyroid TSH done today Continue Synthroid 137 mcg daily Will adjust depending on TSH results

## 2023-06-13 NOTE — Patient Instructions (Signed)
 Health Maintenance After Age 70 After age 4, you are at a higher risk for certain long-term diseases and infections as well as injuries from falls. Falls are a major cause of broken bones and head injuries in people who are older than age 47. Getting regular preventive care can help to keep you healthy and well. Preventive care includes getting regular testing and making lifestyle changes as recommended by your health care provider. Talk with your health care provider about: Which screenings and tests you should have. A screening is a test that checks for a disease when you have no symptoms. A diet and exercise plan that is right for you. What should I know about screenings and tests to prevent falls? Screening and testing are the best ways to find a health problem early. Early diagnosis and treatment give you the best chance of managing medical conditions that are common after age 37. Certain conditions and lifestyle choices may make you more likely to have a fall. Your health care provider may recommend: Regular vision checks. Poor vision and conditions such as cataracts can make you more likely to have a fall. If you wear glasses, make sure to get your prescription updated if your vision changes. Medicine review. Work with your health care provider to regularly review all of the medicines you are taking, including over-the-counter medicines. Ask your health care provider about any side effects that may make you more likely to have a fall. Tell your health care provider if any medicines that you take make you feel dizzy or sleepy. Strength and balance checks. Your health care provider may recommend certain tests to check your strength and balance while standing, walking, or changing positions. Foot health exam. Foot pain and numbness, as well as not wearing proper footwear, can make you more likely to have a fall. Screenings, including: Osteoporosis screening. Osteoporosis is a condition that causes  the bones to get weaker and break more easily. Blood pressure screening. Blood pressure changes and medicines to control blood pressure can make you feel dizzy. Depression screening. You may be more likely to have a fall if you have a fear of falling, feel depressed, or feel unable to do activities that you used to do. Alcohol use screening. Using too much alcohol can affect your balance and may make you more likely to have a fall. Follow these instructions at home: Lifestyle Do not drink alcohol if: Your health care provider tells you not to drink. If you drink alcohol: Limit how much you have to: 0-1 drink a day for women. 0-2 drinks a day for men. Know how much alcohol is in your drink. In the [place] one drink equals one 12 oz bottle of beer (355 mL), one 5 oz glass of wine (148 mL), or one 1 oz glass of hard liquor (44 mL). Do not use any products that contain nicotine or tobacco. These products include cigarettes, chewing tobacco, and vaping devices, such as e-cigarettes. If you need help quitting, ask your health care provider. Activity  Follow a regular exercise program to stay fit. This will help you maintain your balance. Ask your health care provider what types of exercise are appropriate for you. If you need a cane or walker, use it as recommended by your health care provider. Wear supportive shoes that have nonskid soles. Safety  Remove any tripping hazards, such as rugs, cords, and clutter. Install safety equipment such as grab bars in bathrooms and safety rails on stairs. Keep rooms and walkways  well-lit. General instructions Talk with your health care provider about your risks for falling. Tell your health care provider if: You fall. Be sure to tell your health care provider about all falls, even ones that seem minor. You feel dizzy, tiredness (fatigue), or off-balance. Take over-the-counter and prescription medicines only as told by your health care provider. These include  supplements. Eat a healthy diet and maintain a healthy weight. A healthy diet includes low-fat dairy products, low-fat (lean) meats, and fiber from whole grains, beans, and lots of fruits and vegetables. Stay current with your vaccines. Schedule regular health, dental, and eye exams. Summary Having a healthy lifestyle and getting preventive care can help to protect your health and wellness after age 11. Screening and testing are the best way to find a health problem early and help you avoid having a fall. Early diagnosis and treatment give you the best chance for managing medical conditions that are more common for people who are older than age 28. Falls are a major cause of broken bones and head injuries in people who are older than age 48. Take precautions to prevent a fall at home. Work with your health care provider to learn what changes you can make to improve your health and wellness and to prevent falls. This information is not intended to replace advice given to you by your health care provider. Make sure you discuss any questions you have with your health care provider. Document Revised: 07/21/2020 Document Reviewed: 07/21/2020 Elsevier Patient Education  2024 [employer].

## 2023-06-13 NOTE — Assessment & Plan Note (Signed)
 BP Readings from Last 3 Encounters:  06/13/23 130/78  01/25/23 124/76  10/18/22 120/74  Well-controlled hypertension off medications. Cardiovascular risks associated with hypertension discussed Diet and nutrition discussed

## 2023-06-13 NOTE — Assessment & Plan Note (Signed)
 Chronic stable condition Lipid profile done today. Not taking rosuvastatin Diet and nutrition discussed The 10-year ASCVD risk score (Arnett DK, et al., 2019) is: 16.4%   Values used to calculate the score:     Age: 68 years     Sex: Male     Is Non-Hispanic African American: Yes     Diabetic: No     Tobacco smoker: No     Systolic Blood Pressure: 130 mmHg     Is BP treated: Yes     HDL Cholesterol: 72.4 mg/dL     Total Cholesterol: 153 mg/dL

## 2023-06-13 NOTE — Progress Notes (Signed)
 Jerry Freeman 68 y.o.   Chief Complaint  Patient presents with   Follow-up    6 month f/u for HTN. Patient states he has been feeling sluggish. He also mentions every morning he's been waking up hot and believes his thyroid medication might be off. Has been feeling for about 3 weeks now. He states he does not need the amlodipine, rosuvastatin     HISTORY OF PRESENT ILLNESS: This is a 68 y.o. male A1A here for 13-month follow-up of hypertension and dyslipidemia Presently not taking any medication except Synthroid for hypothyroidism Needs blood work for thyroid check. Stays physically active.  Good appetite.  Sleeping well. Occasional night sweats. No other complaints or medical concerns today.  HPI   Prior to Admission medications   Medication Sig Start Date End Date Taking? Authorizing Provider  amLODipine (NORVASC) 5 MG tablet TAKE 1 TABLET DAILY 05/27/23  Yes Jheri Mitter, Eilleen Kempf, MD  Ascorbic Acid (VITAMIN C) 1000 MG tablet Take 1,000 mg by mouth daily.   Yes [provider]  Calcium-Phosphorus-Vitamin D (CITRACAL +D3 PO) Take 1 tablet by mouth daily.   Yes [provider]  Boris Lown Oil 350 MG CAPS Take 350 mg by mouth daily.   Yes [provider]  levocetirizine (XYZAL) 5 MG tablet TAKE 1 TABLET BY MOUTH EVERY DAY IN THE EVENING 01/19/23  Yes Nydia Ytuarte, Eilleen Kempf, MD  levothyroxine (SYNTHROID) 137 MCG tablet Take 1 tablet (137 mcg total) by mouth daily before breakfast. 10/04/22  Yes Zeva Leber, Eilleen Kempf, MD  Multiple Vitamins-Minerals (MEGA MULTI MEN PO) Take 1 packet by mouth daily. GNC 50 PLUS    Yes [provider]  rosuvastatin (CRESTOR) 10 MG tablet TAKE 1 TABLET DAILY 06/11/23  Yes Selena Swaminathan, Eilleen Kempf, MD  meloxicam (MOBIC) 7.5 MG tablet Take 1 tablet (7.5 mg total) by mouth daily as needed for pain. Patient not taking: Reported on 06/13/2023 10/19/22   Georgina Quint, MD    No Known Allergies  Patient Active Problem List    Diagnosis Date Noted   Dyslipidemia 05/26/2021   Essential hypertension 07/24/2020   Environmental allergies 07/24/2020   Chronic cough 07/24/2020   Benign prostatic hyperplasia with urinary obstruction 11/14/2019   DDD (degenerative disc disease), lumbar 11/20/2013   Family history of colon cancer 12/11/2011   Cancer of thyroid (HCC) 12/11/2011   Hypothyroidism 08/16/2011   ED (erectile dysfunction) 08/16/2011    Past Medical History:  Diagnosis Date   Arthritis    Cancer (HCC)    thyroid    Eczema    Hypertension    Hypothyroidism    Seasonal allergies     Past Surgical History:  Procedure Laterality Date   ADENOIDECTOMY     ARTHRODESIS METATARSALPHALANGEAL JOINT (MTPJ) Left 05/26/2022   Procedure: ARTHRODESIS METATARSALPHALANGEAL JOINT (MTPJ), left 1st;  Surgeon: Netta Cedars, MD;  Location: [facility];  Service: Orthopedics;  Laterality: Left;   HERNIA REPAIR     13 yrs ago   QUADRICEPS TENDON REPAIR Right 09/28/2019   Procedure: REPAIR QUADRICEP TENDON;  Surgeon: Eugenia Mcalpine, MD;  Location: [facility];  Service: Orthopedics;  Laterality: Right;   rupture quad tenden     THYROIDECTOMY     4 or 5 yrs ago   TONSILLECTOMY      Social History   Socioeconomic History   Marital status: Married    Spouse name: Not on file   Number of children: 2   Years of education: 16   Highest education  level: Bachelor's degree (e.g., BA, AB, BS)  Occupational History   Occupation: rail roader  Tobacco Use   Smoking status: Former    Current packs/day: 0.00    Types: Cigarettes    Start date: 12/14/1973    Quit date: 12/15/1991    Years since quitting: 31.5   Smokeless tobacco: Never   Tobacco comments:    also smoked marijuana,and cocaine (free-base)   Vaping Use   Vaping status: Never Used  Substance and Sexual Activity   Alcohol use: Not Currently    Alcohol/week: 0.0 standard drinks of alcohol    Comment: hx of 30 years ago    Drug use: Not  Currently    Comment: lst 30 years ago    Sexual activity: Not on file  Other Topics Concern   Not on file  Social History Narrative   Exercise cardio and weights 4 days/wk for 1 hour   Social Drivers of Health   Financial Resource Strain: Low Risk  (06/09/2023)   Overall Financial Resource Strain (CARDIA)    Difficulty of Paying Living Expenses: Not hard at all  Food Insecurity: No Food Insecurity (06/09/2023)   Hunger Vital Sign    Worried About Running Out of Food in the Last Year: Never true    Ran Out of Food in the Last Year: Never true  Transportation Needs: No Transportation Needs (06/09/2023)   PRAPARE - Administrator, Civil Service (Medical): No    Lack of Transportation (Non-Medical): No  Physical Activity: Sufficiently Active (06/09/2023)   Exercise Vital Sign    Days of Exercise per Week: 6 days    Minutes of Exercise per Session: 90 min  Stress: No Stress Concern Present (06/09/2023)   [employer] of Occupational Health - Occupational Stress Questionnaire    Feeling of Stress : Not at all  Social Connections: Socially Integrated (06/09/2023)   Social Connection and Isolation Panel [NHANES]    Frequency of Communication with Friends and Family: More than three times a week    Frequency of Social Gatherings with Friends and Family: More than three times a week    Attends Religious Services: More than 4 times per year    Active Member of [employer] or Organizations: Yes    Attends Engineer, structural: More than 4 times per year    Marital Status: Married  Catering manager Violence: Not At Risk (08/10/2022)   Humiliation, Afraid, Rape, and Kick questionnaire    Fear of Current or Ex-Partner: No    Emotionally Abused: No    Physically Abused: No    Sexually Abused: No    Family History  Problem Relation Age of Onset   Cancer Mother        lung   Cancer Father        rectal   High Cholesterol Brother    Hypertension Brother    Stroke Brother     Kidney disease Maternal Grandmother        dialysis   Hypertension Maternal Grandmother    Hyperlipidemia Maternal Grandmother    Arthritis Maternal Grandfather    Allergic rhinitis Neg Hx    Asthma Neg Hx    Eczema Neg Hx    Urticaria Neg Hx      Review of Systems  Constitutional: Negative.  Negative for chills and fever.  HENT: Negative.  Negative for congestion and sore throat.   Respiratory: Negative.  Negative for cough and shortness of breath.   Cardiovascular:  Negative.  Negative for chest pain and palpitations.  Gastrointestinal:  Negative for abdominal pain, diarrhea, nausea and vomiting.  Genitourinary: Negative.  Negative for dysuria and hematuria.  Skin: Negative.  Negative for rash.  Neurological: Negative.  Negative for dizziness and headaches.  All other systems reviewed and are negative.   Vitals:   06/13/23 0938  BP: 130/78  Pulse: (!) 58  Temp: 97.8 F (36.6 C)  SpO2: 97%    Physical Exam Vitals reviewed.  Constitutional:      Appearance: Normal appearance.  HENT:     Head: Normocephalic.     Mouth/Throat:     Mouth: Mucous membranes are moist.     Pharynx: Oropharynx is clear.  Eyes:     Extraocular Movements: Extraocular movements intact.     Conjunctiva/sclera: Conjunctivae normal.     Pupils: Pupils are equal, round, and reactive to light.  Cardiovascular:     Rate and Rhythm: Normal rate and regular rhythm.     Pulses: Normal pulses.     Heart sounds: Normal heart sounds.  Pulmonary:     Effort: Pulmonary effort is normal.     Breath sounds: Normal breath sounds.  Abdominal:     Palpations: Abdomen is soft.     Tenderness: There is no abdominal tenderness.  Musculoskeletal:     Cervical back: No tenderness.     Right lower leg: No edema.     Left lower leg: No edema.  Lymphadenopathy:     Cervical: No cervical adenopathy.  Skin:    General: Skin is warm and dry.     Capillary Refill: Capillary refill takes less than 2 seconds.   Neurological:     General: No focal deficit present.     Mental Status: He is alert and oriented to person, place, and time.  Psychiatric:        Mood and Affect: Mood normal.        Behavior: Behavior normal.      ASSESSMENT & PLAN: A total of 42 minutes was spent with the patient and counseling/coordination of care regarding preparing for this visit, review of most recent office visit notes, review of multiple chronic medical conditions and their management, cardiovascular risks associated with hypertension and dyslipidemia, review of all medications, review of most recent bloodwork results, review of health maintenance items, education on nutrition, prognosis, documentation, and need for follow up.   Problem List Items Addressed This Visit       Cardiovascular and Mediastinum   Essential hypertension - Primary   BP Readings from Last 3 Encounters:  06/13/23 130/78  01/25/23 124/76  10/18/22 120/74  Well-controlled hypertension off medications. 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 Completion of thyroidectomy performed August 2007 with reimplantation of left inferior parathyroid gland. Now on chronic thyroid hormone replacement. Clinically euthyroid TSH done today Continue Synthroid 137 mcg daily Will adjust depending on TSH results      Relevant Orders   TSH   CBC with Differential/Platelet   Comprehensive metabolic panel with GFR   Lipid panel     Other   Dyslipidemia   Chronic stable condition Lipid profile done today. Not taking rosuvastatin Diet and nutrition discussed  The 10-year ASCVD risk score (Arnett DK, et al., 2019) is: 16.4%   Values used to  calculate the score:     Age: 89 years     Sex: Male     Is Non-Hispanic African American: Yes     Diabetic: No     Tobacco smoker: No     Systolic Blood Pressure: 130 mmHg     Is BP treated: Yes     HDL Cholesterol: 72.4 mg/dL     Total Cholesterol: 153 mg/dL       Relevant Orders   CBC with Differential/Platelet   Comprehensive metabolic panel with GFR   Lipid panel   Patient Instructions  Health Maintenance After Age 54 After age 84, you are at a higher risk for certain long-term diseases and infections as well as injuries from falls. Falls are a major cause of broken bones and head injuries in people who are older than age 71. Getting regular preventive care can help to keep you healthy and well. Preventive care includes getting regular testing and making lifestyle changes as recommended by your health care provider. Talk with your health care provider about: Which screenings and tests you should have. A screening is a test that checks for a disease when you have no symptoms. A diet and exercise plan that is right for you. What should I know about screenings and tests to prevent falls? Screening and testing are the best ways to find a health problem early. Early diagnosis and treatment give you the best chance of managing medical conditions that are common after age 88. Certain conditions and lifestyle choices may make you more likely to have a fall. Your health care provider may recommend: Regular vision checks. Poor vision and conditions such as cataracts can make you more likely to have a fall. If you wear glasses, make sure to get your prescription updated if your vision changes. Medicine review. Work with your health care provider to regularly review all of the medicines you are taking, including over-the-counter medicines. Ask your health care provider about any side effects that may make you more likely to have a fall. Tell your health care provider if any medicines that you take  make you feel dizzy or sleepy. Strength and balance checks. Your health care provider may recommend certain tests to check your strength and balance while standing, walking, or changing positions. Foot health exam. Foot pain and numbness, as well as not wearing proper footwear, can make you more likely to have a fall. Screenings, including: Osteoporosis screening. Osteoporosis is a condition that causes the bones to get weaker and break more easily. Blood pressure screening. Blood pressure changes and medicines to control blood pressure can make you feel dizzy. Depression screening. You may be more likely to have a fall if you have a fear of falling, feel depressed, or feel unable to do activities that you used to do. Alcohol use screening. Using too much alcohol can affect your balance and may make you more likely to have a fall. Follow these instructions at home: Lifestyle Do not drink alcohol if: Your health care provider tells you not to drink. If you drink alcohol: Limit how much you have to: 0-1 drink a day for women. 0-2 drinks a day for men. Know how much alcohol is in your drink. In the [place] one drink equals one 12 oz bottle of beer (355 mL), one 5 oz glass of wine (148 mL), or one 1 oz glass of hard  liquor (44 mL). Do not use any products that contain nicotine or tobacco. These products include cigarettes, chewing tobacco, and vaping devices, such as e-cigarettes. If you need help quitting, ask your health care provider. Activity  Follow a regular exercise program to stay fit. This will help you maintain your balance. Ask your health care provider what types of exercise are appropriate for you. If you need a cane or walker, use it as recommended by your health care provider. Wear supportive shoes that have nonskid soles. Safety  Remove any tripping hazards, such as rugs, cords, and clutter. Install safety equipment such as grab bars in bathrooms and safety rails on stairs. Keep  rooms and walkways well-lit. General instructions Talk with your health care provider about your risks for falling. Tell your health care provider if: You fall. Be sure to tell your health care provider about all falls, even ones that seem minor. You feel dizzy, tiredness (fatigue), or off-balance. Take over-the-counter and prescription medicines only as told by your health care provider. These include supplements. Eat a healthy diet and maintain a healthy weight. A healthy diet includes low-fat dairy products, low-fat (lean) meats, and fiber from whole grains, beans, and lots of fruits and vegetables. Stay current with your vaccines. Schedule regular health, dental, and eye exams. Summary Having a healthy lifestyle and getting preventive care can help to protect your health and wellness after age 64. Screening and testing are the best way to find a health problem early and help you avoid having a fall. Early diagnosis and treatment give you the best chance for managing medical conditions that are more common for people who are older than age 70. Falls are a major cause of broken bones and head injuries in people who are older than age 21. Take precautions to prevent a fall at home. Work with your health care provider to learn what changes you can make to improve your health and wellness and to prevent falls. This information is not intended to replace advice given to you by your health care provider. Make sure you discuss any questions you have with your health care provider. Document Revised: 07/21/2020 Document Reviewed: 07/21/2020 Elsevier Patient Education  2024 Elsevier Inc.    Edwina Barth, MD [facility]

## 2023-06-16 DIAGNOSIS — M19031 Primary osteoarthritis, right wrist: Secondary | ICD-10-CM | POA: Diagnosis not present

## 2023-06-16 DIAGNOSIS — M19032 Primary osteoarthritis, left wrist: Secondary | ICD-10-CM | POA: Diagnosis not present

## 2023-07-07 ENCOUNTER — Ambulatory Visit

## 2023-07-07 VITALS — Ht 72.0 in | Wt 209.0 lb

## 2023-07-07 DIAGNOSIS — Z0000 Encounter for general adult medical examination without abnormal findings: Secondary | ICD-10-CM

## 2023-07-07 NOTE — Patient Instructions (Signed)
 Jerry Freeman , Thank you for taking time to come for your Medicare Wellness Visit. I appreciate your ongoing commitment to your health goals. Please review the following plan we discussed and let me know if I can assist you in the future.   Referrals/Orders/Follow-Ups/Clinician Recommendations: It was nice talking with you today.  Keep up the good work.    This is a list of the screening recommended for you and due dates:  Health Maintenance  Topic Date Due   COVID-19 Vaccine (4 - 2024-25 season) 11/14/2022   Medicare Annual Wellness Visit  08/10/2023   Flu Shot  10/14/2023   Colon Cancer Screening  05/30/2024   DTaP/Tdap/Td vaccine (3 - Td or Tdap) 01/13/2031   Pneumonia Vaccine  Completed   Hepatitis C Screening  Completed   Zoster (Shingles) Vaccine  Completed   HPV Vaccine  Aged Out   Meningitis B Vaccine  Aged Out    Advanced directives: (Copy Requested) Please bring a copy of your health care power of attorney and living will to the office to be added to your chart at your convenience. You can mail to [facility] [address] [place], [place] [address] or email to [email or web address]  Next Medicare Annual Wellness Visit scheduled for next year: Yes

## 2023-07-07 NOTE — Progress Notes (Signed)
 Subjective:   Jerry Freeman is a 68 y.o. who presents for a Medicare Wellness preventive visit.  Visit Complete: Virtual I connected with  Jerry Freeman on 07/07/23 by a audio enabled telemedicine application and verified that I am speaking with the correct person using two identifiers.  Patient Location: Home  Provider Location: Home Office  I discussed the limitations of evaluation and management by telemedicine. The patient expressed understanding and agreed to proceed.  Vital Signs: Because this visit was a virtual/telehealth visit, some criteria may be missing or patient reported. Any vitals not documented were not able to be obtained and vitals that have been documented are patient reported.  VideoDeclined- This patient declined Librarian, academic. Therefore the visit was completed with audio only.  Persons Participating in Visit: Patient.  AWV Questionnaire: No: Patient Medicare AWV questionnaire was not completed prior to this visit.  Cardiac Risk Factors include: advanced age (>26men, >8 women);hypertension;male gender;dyslipidemia     Objective:    Today's Vitals   07/07/23 1259  Weight: 209 lb (94.8 kg)  Height: 6' (1.829 m)   Body mass index is 28.35 kg/m.     07/07/2023    1:30 PM 08/10/2022    9:53 AM 05/19/2022   12:40 PM 01/18/2022   11:49 AM 09/24/2019    2:11 PM  Advanced Directives  Does Patient Have a Medical Advance Directive? Yes Yes Yes Yes No  Type of Estate agent of [place];Living will Healthcare Power of [place];Living will Healthcare Power of [place];Living will Healthcare Power of [place];Living will   Does patient want to make changes to medical advance directive?   No - Patient declined    Copy of Healthcare Power of Attorney in Chart? No - copy requested No - copy requested No - copy requested No - copy requested     Current Medications (verified) Outpatient Encounter Medications as of  07/07/2023  Medication Sig   amLODipine  (NORVASC ) 5 MG tablet TAKE 1 TABLET DAILY   Ascorbic Acid (VITAMIN C) 1000 MG tablet Take 1,000 mg by mouth daily.   Calcium -Phosphorus-Vitamin D  (CITRACAL +D3 PO) Take 1 tablet by mouth daily.   Krill Oil 350 MG CAPS Take 350 mg by mouth daily.   levocetirizine (XYZAL ) 5 MG tablet TAKE 1 TABLET BY MOUTH EVERY DAY IN THE EVENING   levothyroxine  (SYNTHROID ) 125 MCG tablet Take 1 tablet (125 mcg total) by mouth daily.   meloxicam  (MOBIC ) 7.5 MG tablet Take 1 tablet (7.5 mg total) by mouth daily as needed for pain.   Multiple Vitamins-Minerals (MEGA MULTI MEN PO) Take 1 packet by mouth daily. GNC 50 PLUS    rosuvastatin  (CRESTOR ) 10 MG tablet TAKE 1 TABLET DAILY (Patient not taking: Reported on 07/07/2023)   No facility-administered encounter medications on file as of 07/07/2023.    Allergies (verified) Patient has no known allergies.   History: Past Medical History:  Diagnosis Date   Arthritis    Cancer (HCC)    thyroid     Eczema    Hypertension    Hypothyroidism    Seasonal allergies    Past Surgical History:  Procedure Laterality Date   ADENOIDECTOMY     ARTHRODESIS METATARSALPHALANGEAL JOINT (MTPJ) Left 05/26/2022   Procedure: ARTHRODESIS METATARSALPHALANGEAL JOINT (MTPJ), left 1st;  Surgeon: Ali Ink, MD;  Location: [facility];  Service: Orthopedics;  Laterality: Left;   HERNIA REPAIR     13 yrs ago   QUADRICEPS TENDON REPAIR Right 09/28/2019  Procedure: REPAIR QUADRICEP TENDON;  Surgeon: Genevie Kerns, MD;  Location: [facility];  Service: Orthopedics;  Laterality: Right;   rupture quad tenden     THYROIDECTOMY     4 or 5 yrs ago   TONSILLECTOMY     Family History  Problem Relation Age of Onset   Cancer Mother        lung   Cancer Father        rectal   High Cholesterol Brother    Hypertension Brother    Stroke Brother    Kidney disease Maternal Grandmother        dialysis   Hypertension Maternal  Grandmother    Hyperlipidemia Maternal Grandmother    Arthritis Maternal Grandfather    Allergic rhinitis Neg Hx    Asthma Neg Hx    Eczema Neg Hx    Urticaria Neg Hx    Social History   Socioeconomic History   Marital status: Married    Spouse name: Diane   Number of children: 2   Years of education: 16   Highest education level: Bachelor's degree (e.g., BA, AB, BS)  Occupational History   Occupation: RETIRED/rail roader  Tobacco Use   Smoking status: Former    Current packs/day: 0.00    Types: Cigarettes    Start date: 12/14/1973    Quit date: 12/15/1991    Years since quitting: 31.5   Smokeless tobacco: Never   Tobacco comments:    also smoked marijuana,and cocaine (free-base)   Vaping Use   Vaping status: Never Used  Substance and Sexual Activity   Alcohol use: Not Currently    Alcohol/week: 0.0 standard drinks of alcohol    Comment: hx of 30 years ago    Drug use: Not Currently    Comment: lst 30 years ago    Sexual activity: Not on file  Other Topics Concern   Not on file  Social History Narrative   Exercise cardio and weights 4 days/wk for 1 hour      Lives with wife/2025   Social Drivers of Health   Financial Resource Strain: Low Risk  (07/07/2023)   Overall Financial Resource Strain (CARDIA)    Difficulty of Paying Living Expenses: Not hard at all  Food Insecurity: No Food Insecurity (07/07/2023)   Hunger Vital Sign    Worried About Running Out of Food in the Last Year: Never true    Ran Out of Food in the Last Year: Never true  Transportation Needs: No Transportation Needs (07/07/2023)   PRAPARE - Administrator, Civil Service (Medical): No    Lack of Transportation (Non-Medical): No  Physical Activity: Sufficiently Active (07/07/2023)   Exercise Vital Sign    Days of Exercise per Week: 7 days    Minutes of Exercise per Session: 90 min  Stress: No Stress Concern Present (07/07/2023)   [employer] of Occupational Health - Occupational  Stress Questionnaire    Feeling of Stress : Not at all  Social Connections: Socially Integrated (07/07/2023)   Social Connection and Isolation Panel [NHANES]    Frequency of Communication with Friends and Family: Twice a week    Frequency of Social Gatherings with Friends and Family: More than three times a week    Attends Religious Services: More than 4 times per year    Active Member of [employer] or Organizations: Yes    Attends Banker Meetings: Never    Marital Status: Married    Tobacco Counseling Counseling given:  Not Answered Tobacco comments: also smoked marijuana,and cocaine (free-base)     Clinical Intake:  Pre-visit preparation completed: Yes  Pain : No/denies pain     BMI - recorded: 28.35 Nutritional Status: BMI 25 -29 Overweight Nutritional Risks: None Diabetes: No  Lab Results  Component Value Date   HGBA1C 5.7 01/25/2023   HGBA1C 5.5 10/18/2022   HGBA1C 5.8 01/18/2022     How often do you need to have someone help you when you read instructions, pamphlets, or other written materials from your doctor or pharmacy?: 1 - Never  Interpreter Needed?: No  Information entered by :: Gracie Gupta, RMA   Activities of Daily Living     07/07/2023    1:00 PM 08/05/2022    2:28 PM  In your present state of health, do you have any difficulty performing the following activities:  Hearing? 0 0  Vision? 0 0  Difficulty concentrating or making decisions? 0 0  Walking or climbing stairs? 0 0  Dressing or bathing? 0 0  Doing errands, shopping? 0 0  Preparing Food and eating ? N N  Using the Toilet? N N  In the past six months, have you accidently leaked urine? N N  Do you have problems with loss of bowel control? N N  Managing your Medications? N N  Managing your Finances? N N  Housekeeping or managing your Housekeeping? N N    Patient Care Team: Sagardia, Miguel Jose, MD as PCP - General (Internal Medicine) [facility]  Indicate any recent Medical Services you may have received from other than [facility] providers in the past year (date may be approximate).     Assessment:   This is a routine wellness examination for Hilda.  Hearing/Vision screen Hearing Screening - Comments:: Denies hearing difficulties   Vision Screening - Comments:: Had cataract surgery    Goals Addressed             This Visit's Progress    My goal is to continue to stay physically fit.   On track      Depression Screen     07/07/2023    1:33 PM 06/13/2023    9:43 AM 01/25/2023    9:27 AM 10/18/2022    9:18 AM 08/10/2022    9:50 AM 01/18/2022   10:12 AM 12/22/2021    2:41 PM  PHQ 2/9 Scores  PHQ - 2 Score 0 0 0 0 0 0 0  PHQ- 9 Score 0          Fall Risk     07/07/2023    1:30 PM 06/13/2023    9:43 AM 01/25/2023    9:27 AM 10/18/2022    9:18 AM 08/05/2022    2:28 PM  Fall Risk   Falls in the past year? 0 0 0 0 0  Number falls in past yr: 0 0 0 0 0  Injury with Fall? 0 0 0 0 0  Risk for fall due to : No Fall Risks No Fall Risks No Fall Risks No Fall Risks   Follow up Falls prevention discussed;Falls evaluation completed Falls evaluation completed Falls evaluation completed Falls evaluation completed Falls prevention discussed;Falls evaluation completed    MEDICARE RISK AT HOME:  Medicare Risk at Home Any stairs in or around the home?: Yes If so, are there any without handrails?: Yes Home free of loose throw rugs in walkways, pet beds, electrical cords, etc?: Yes Adequate lighting in your home to reduce risk  of falls?: Yes Life alert?: No Use of a cane, walker or w/c?: No Grab bars in the bathroom?: No Shower chair or bench in shower?: Yes Elevated toilet seat or a handicapped toilet?: Yes  TIMED UP AND GO:  Was the test performed?  No  Cognitive Function: Declined: Patient declined cognitive screening, but was able to answer questions in an accurate and timely manner. No cognitive impairments  observed.        08/10/2022    9:50 AM 01/18/2022   11:51 AM 01/03/2020    9:07 AM  6CIT Screen  What Year? 0 points 0 points 0 points  What month? 0 points 0 points 0 points  What time? 0 points 0 points 0 points  Count back from 20 0 points 0 points 0 points  Months in reverse 0 points 0 points 0 points  Repeat phrase 0 points 0 points 0 points  Total Score 0 points 0 points 0 points    Immunizations Immunization History  Administered Date(s) Administered   Fluad Quad(high Dose 65+) 12/18/2018, 01/03/2020, 01/12/2021, 12/23/2021   Fluad Trivalent(High Dose 65+) 01/25/2023   Influenza Split 12/08/2011, 12/29/2021   Influenza,inj,Quad PF,6+ Mos 03/13/2013, 03/13/2014, 11/19/2014, 12/23/2015, 01/20/2017, 01/26/2018   Influenza,inj,quad, With Preservative 12/27/2018   PFIZER(Purple Top)SARS-COV-2 Vaccination 05/18/2019, 06/20/2019, 12/29/2021   Pneumococcal Conjugate-13 03/13/2014   Pneumococcal Polysaccharide-23 12/18/2018   Respiratory Syncytial Virus Vaccine,Recomb Aduvanted(Arexvy) 12/29/2021   Tdap 11/03/2009, 01/12/2021   Zoster Recombinant(Shingrix) 01/08/2020, 03/11/2020    Screening Tests Health Maintenance  Topic Date Due   COVID-19 Vaccine (4 - 2024-25 season) 11/14/2022   INFLUENZA VACCINE  10/14/2023   Colonoscopy  05/30/2024   Medicare Annual Wellness (AWV)  07/06/2024   DTaP/Tdap/Td (3 - Td or Tdap) 01/13/2031   Pneumonia Vaccine 51+ Years old  Completed   Hepatitis C Screening  Completed   Zoster Vaccines- Shingrix  Completed   HPV VACCINES  Aged Out   Meningococcal B Vaccine  Aged Out    Health Maintenance  Health Maintenance Due  Topic Date Due   COVID-19 Vaccine (4 - 2024-25 season) 11/14/2022   Health Maintenance Items Addressed: See Nurse Notes  Additional Screening:  Vision Screening: Recommended annual ophthalmology exams for early detection of glaucoma and other disorders of the eye.  Dental Screening: Recommended annual dental exams  for proper oral hygiene  Community Resource Referral / Chronic Care Management: CRR required this visit?  No   CCM required this visit?  No     Plan:     I have personally reviewed and noted the following in the patient's chart:   Medical and social history Use of alcohol, tobacco or illicit drugs  Current medications and supplements including opioid prescriptions. Patient is not currently taking opioid prescriptions. Functional ability and status Nutritional status Physical activity Advanced directives List of other physicians Hospitalizations, surgeries, and ER visits in previous 12 months Vitals Screenings to include cognitive, depression, and falls Referrals and appointments  In addition, I have reviewed and discussed with patient certain preventive protocols, quality metrics, and best practice recommendations. A written personalized care plan for preventive services as well as general preventive health recommendations were provided to patient.     Avrie Kedzierski L Odile Veloso, CMA   07/07/2023   After Visit Summary: (MyChart) Due to this being a telephonic visit, the after visit summary with patients personalized plan was offered to patient via MyChart   Notes: Please refer to Routing Comments.

## 2023-08-19 DIAGNOSIS — M2022 Hallux rigidus, left foot: Secondary | ICD-10-CM | POA: Diagnosis not present

## 2023-08-20 IMAGING — DX DG CHEST 2V
2 series · 2 of 2 positions shown · non-contrast
Comparison: Chest x-ray 05/06/2010

CLINICAL DATA: Cough. Pt did not know how long. No sob or chest
pain

EXAM:
CHEST - 2 VIEW

[chest pa]
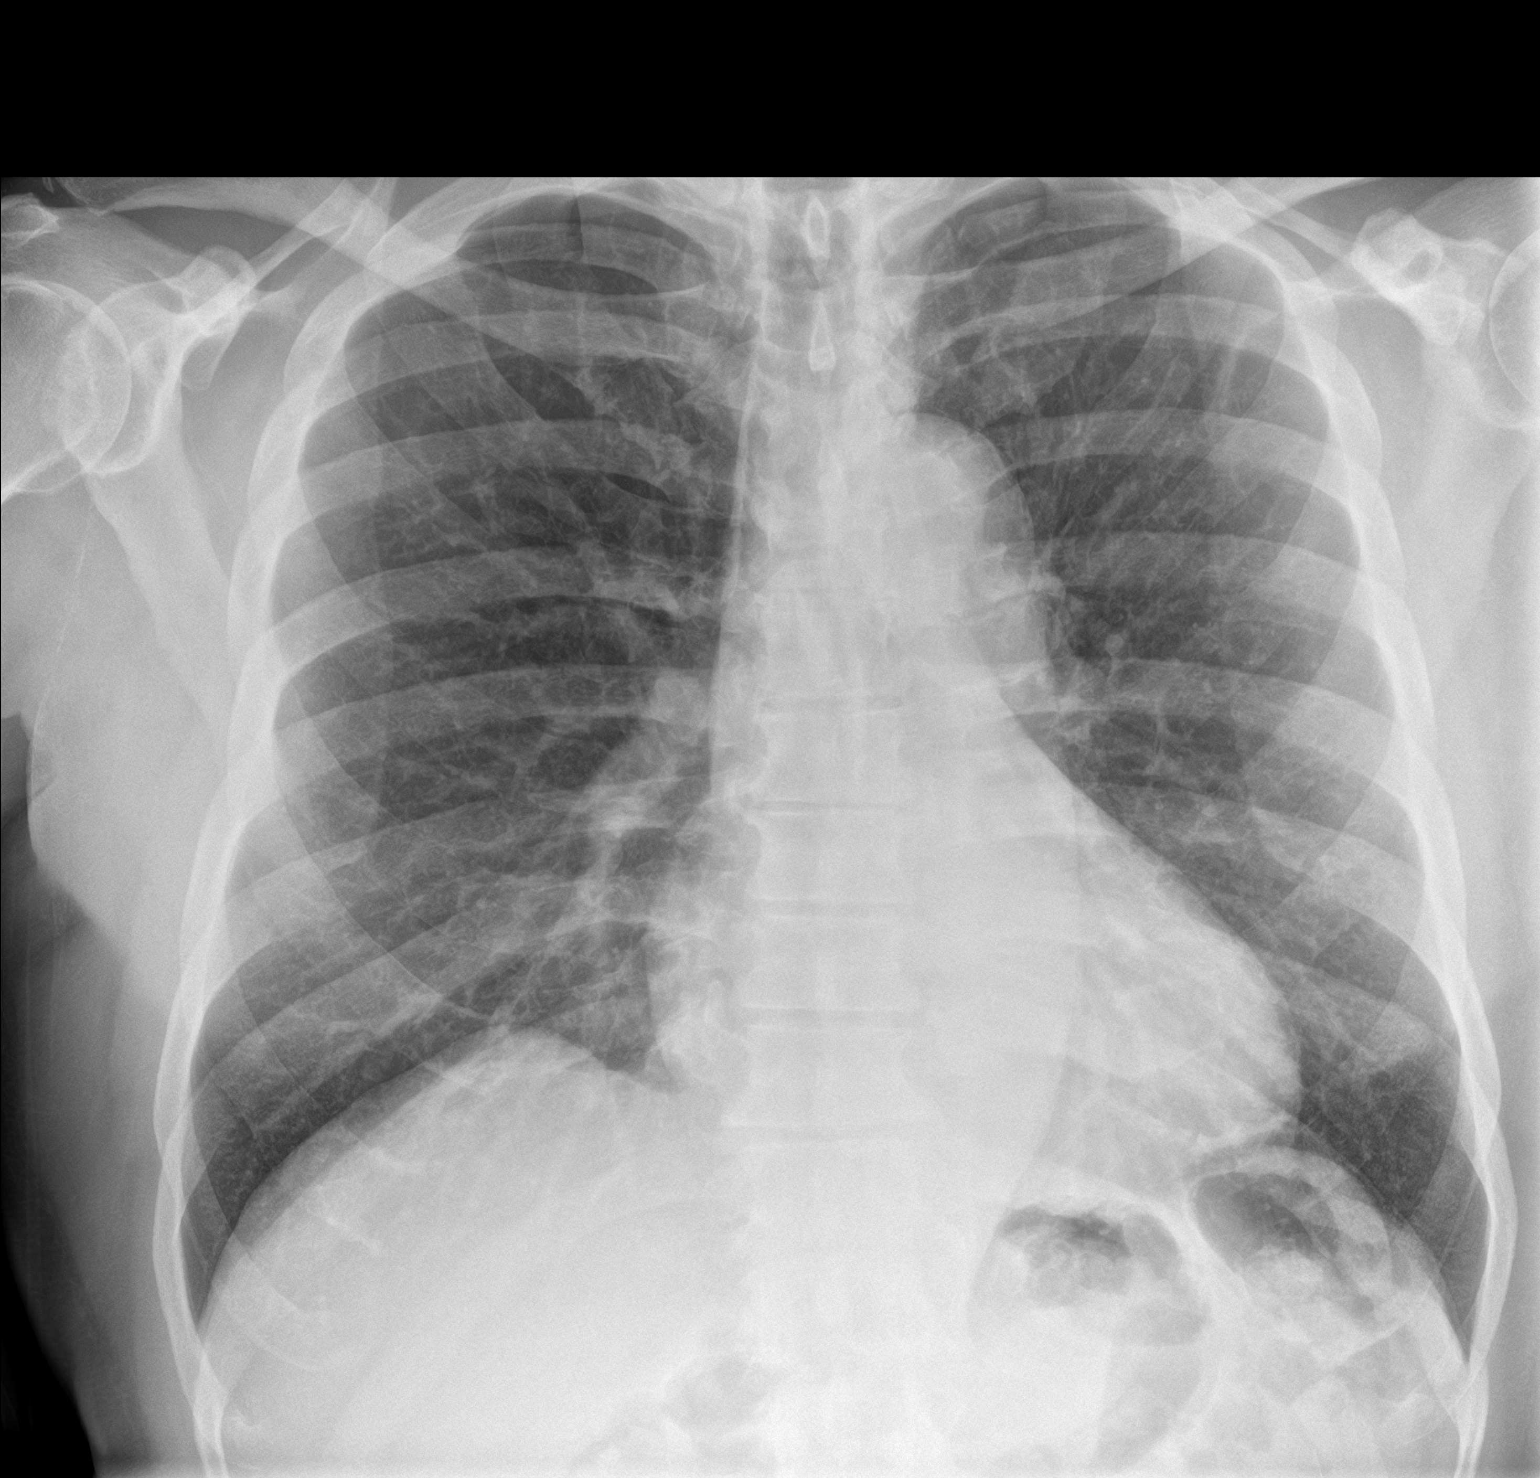

[chest lat]
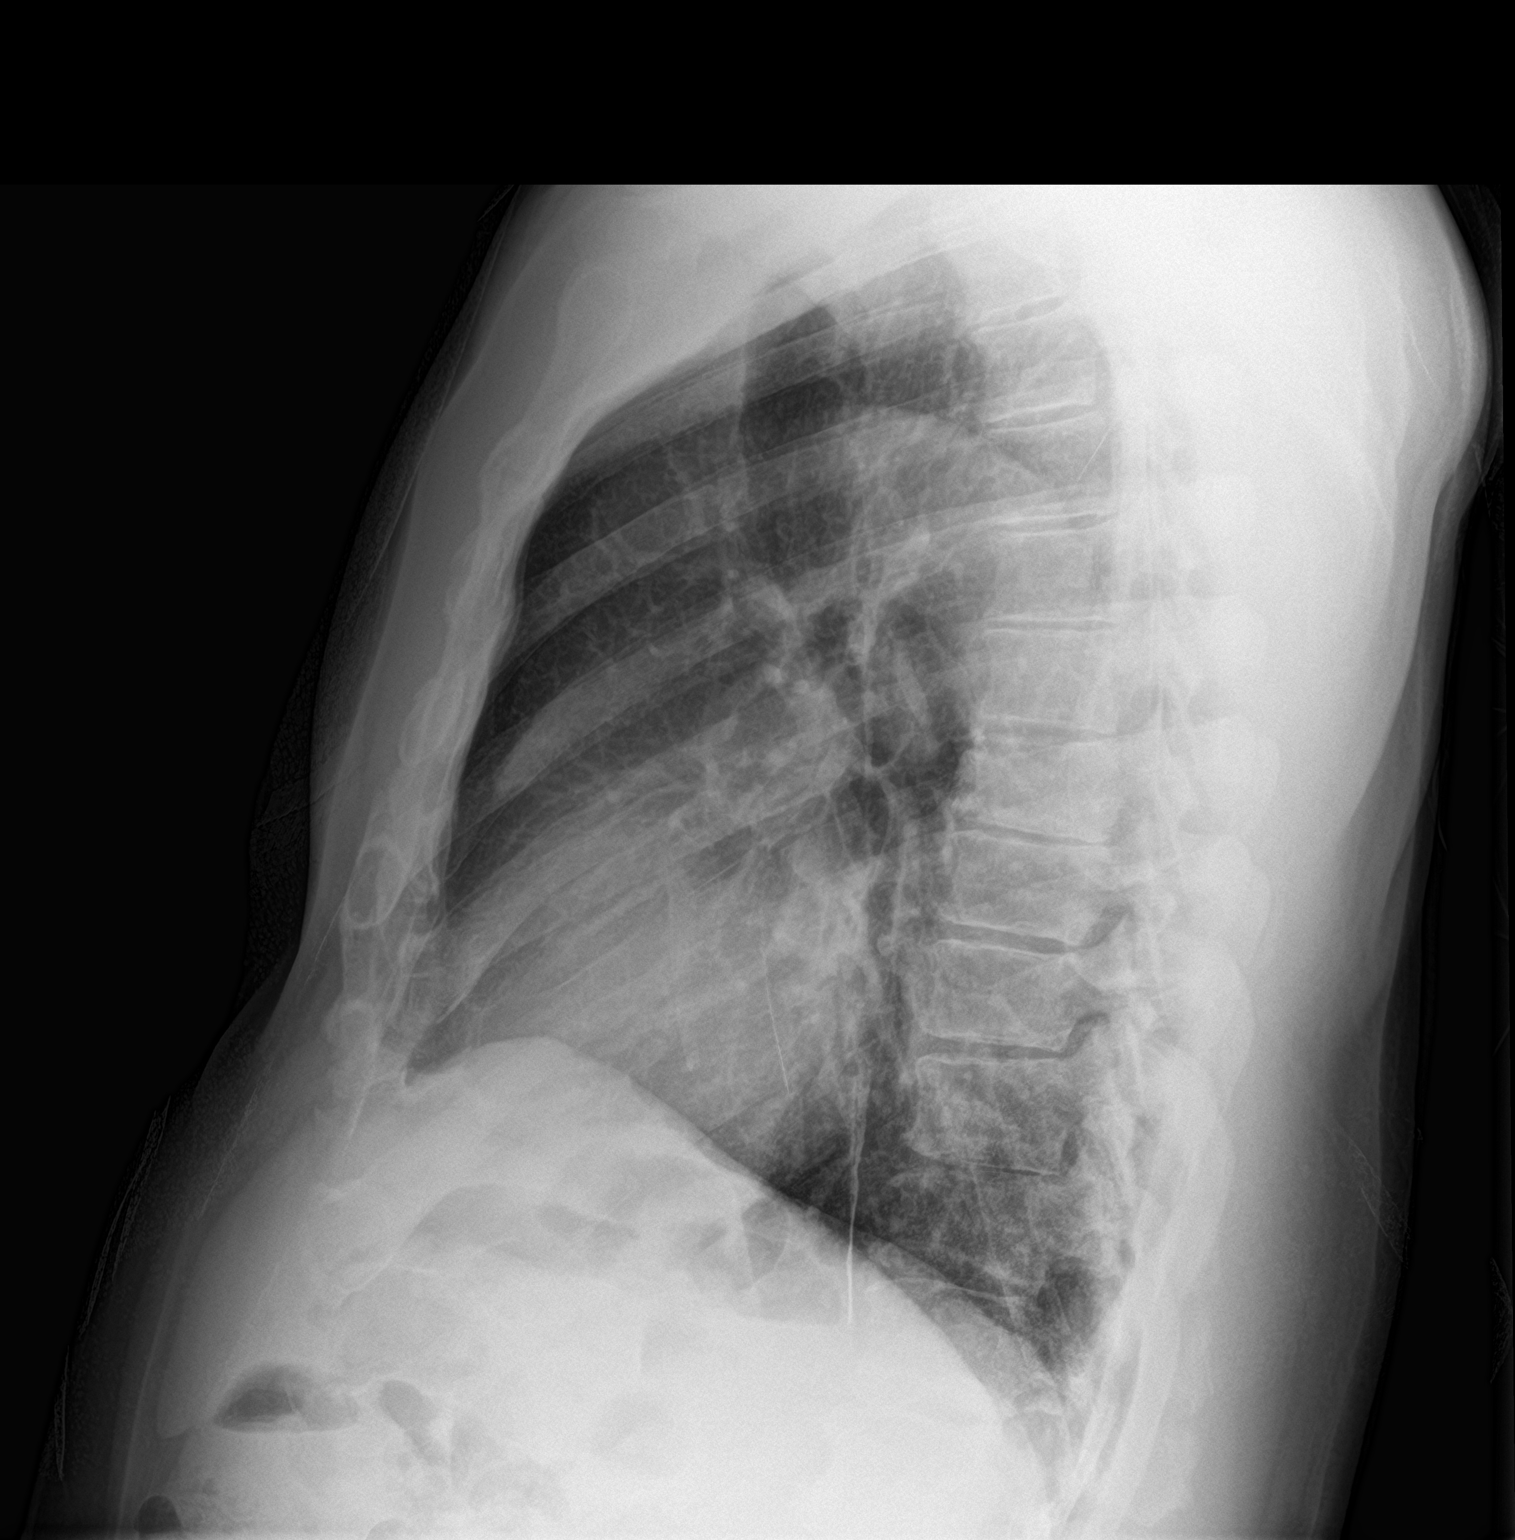

[2 of 2 positions shown; findings below may reference images not displayed]

FINDINGS: The heart and mediastinal contours are within normal limits.

No focal consolidation. No pulmonary edema. No pleural effusion. No
pneumothorax.

No acute osseous abnormality.
IMPRESSION: No active cardiopulmonary disease.

## 2023-09-15 DIAGNOSIS — L309 Dermatitis, unspecified: Secondary | ICD-10-CM | POA: Diagnosis not present

## 2023-12-19 ENCOUNTER — Encounter: Payer: Self-pay | Admitting: Emergency Medicine

## 2023-12-19 ENCOUNTER — Ambulatory Visit: Payer: Self-pay | Admitting: Emergency Medicine

## 2023-12-19 ENCOUNTER — Ambulatory Visit: Admitting: Emergency Medicine

## 2023-12-19 VITALS — BP 124/78 | HR 54 | Temp 97.7°F | Ht 72.0 in | Wt 206.0 lb

## 2023-12-19 DIAGNOSIS — C73 Malignant neoplasm of thyroid gland: Secondary | ICD-10-CM

## 2023-12-19 DIAGNOSIS — Z125 Encounter for screening for malignant neoplasm of prostate: Secondary | ICD-10-CM | POA: Diagnosis not present

## 2023-12-19 DIAGNOSIS — E890 Postprocedural hypothyroidism: Secondary | ICD-10-CM | POA: Diagnosis not present

## 2023-12-19 DIAGNOSIS — I10 Essential (primary) hypertension: Secondary | ICD-10-CM | POA: Diagnosis not present

## 2023-12-19 DIAGNOSIS — E785 Hyperlipidemia, unspecified: Secondary | ICD-10-CM | POA: Diagnosis not present

## 2023-12-19 DIAGNOSIS — Z23 Encounter for immunization: Secondary | ICD-10-CM | POA: Diagnosis not present

## 2023-12-19 DIAGNOSIS — R252 Cramp and spasm: Secondary | ICD-10-CM

## 2023-12-19 LAB — COMPREHENSIVE METABOLIC PANEL WITH GFR
ALT: 24 U/L (ref 0–53)
AST: 31 U/L (ref 0–37)
Albumin: 4.3 g/dL (ref 3.5–5.2)
Alkaline Phosphatase: 51 U/L (ref 39–117)
BUN: 25 mg/dL — ABNORMAL HIGH (ref 6–23)
CO2: 26 meq/L (ref 19–32)
Calcium: 9 mg/dL (ref 8.4–10.5)
Chloride: 103 meq/L (ref 96–112)
Creatinine, Ser: 1.09 mg/dL (ref 0.40–1.50)
GFR: 68.74 mL/min (ref >60.00)
Glucose, Bld: 83 mg/dL (ref 70–99)
Potassium: 4.4 meq/L (ref 3.5–5.1)
Sodium: 140 meq/L (ref 135–145)
Total Bilirubin: 0.8 mg/dL (ref 0.2–1.2)
Total Protein: 6.8 g/dL (ref 6.0–8.3)

## 2023-12-19 LAB — LIPID PANEL
Cholesterol: 192 mg/dL (ref 0–200)
HDL: 68.1 mg/dL (ref >39.00)
LDL Cholesterol: 109 mg/dL — ABNORMAL HIGH (ref 0–99)
NonHDL: 124.31
Total CHOL/HDL Ratio: 3
Triglycerides: 76 mg/dL (ref 0.0–149.0)
VLDL: 15.2 mg/dL (ref 0.0–40.0)

## 2023-12-19 LAB — CBC WITH DIFFERENTIAL/PLATELET
Basophils Absolute: 0.1 K/uL (ref 0.0–0.1)
Basophils Relative: 1.1 % (ref 0.0–3.0)
Eosinophils Absolute: 0.6 K/uL (ref 0.0–0.7)
Eosinophils Relative: 11.2 % — ABNORMAL HIGH (ref 0.0–5.0)
HCT: 41.6 % (ref 39.0–52.0)
Hemoglobin: 13.7 g/dL (ref 13.0–17.0)
Lymphocytes Relative: 32.2 % (ref 12.0–46.0)
Lymphs Abs: 1.6 K/uL (ref 0.7–4.0)
MCHC: 32.9 g/dL (ref 30.0–36.0)
MCV: 81.2 fl (ref 78.0–100.0)
Monocytes Absolute: 0.5 K/uL (ref 0.1–1.0)
Monocytes Relative: 9.9 % (ref 3.0–12.0)
Neutro Abs: 2.2 K/uL (ref 1.4–7.7)
Neutrophils Relative %: 45.6 % (ref 43.0–77.0)
Platelets: 144 K/uL — ABNORMAL LOW (ref 150.0–400.0)
RBC: 5.12 Mil/uL (ref 4.22–5.81)
RDW: 15.3 % (ref 11.5–15.5)
WBC: 4.9 K/uL (ref 4.0–10.5)

## 2023-12-19 LAB — TSH: TSH: 2.73 u[IU]/mL (ref 0.35–5.50)

## 2023-12-19 LAB — HEMOGLOBIN A1C: Hgb A1c MFr Bld: 5.6 % (ref 4.6–6.5)

## 2023-12-19 LAB — PSA: PSA: 1.83 ng/mL (ref 0.10–4.00)

## 2023-12-19 LAB — MAGNESIUM: Magnesium: 2 mg/dL (ref 1.5–2.5)

## 2023-12-19 NOTE — Assessment & Plan Note (Signed)
 Follicular carcinoma (Hurthle cell characteristics noted on right thyroid  lobectomy performed in May 2007). Completion of thyroidectomy performed August 2007 with reimplantation of left inferior parathyroid gland. Now on chronic thyroid  hormone replacement. Clinically euthyroid TSH done today Continue Synthroid  125 mcg daily Will adjust depending on TSH results

## 2023-12-19 NOTE — Assessment & Plan Note (Signed)
 Chronic stable condition Lipid profile done today. Not taking rosuvastatin  Diet and nutrition discussed

## 2023-12-19 NOTE — Progress Notes (Signed)
 Jerry Freeman 68 y.o.   Chief Complaint  Patient presents with   Follow-up    Patient here for 6 month f/u for HTN. No other concerns    HISTORY OF PRESENT ILLNESS: This is a 68 y.o. male A1A here for 72-month follow-up of chronic medical conditions including hypertension Overall doing well. Has occasional cramping to left lower extremity.  Very physically active.  Using liquid IV. No other complaints or medical concerns today.  HPI   Prior to Admission medications   Medication Sig Start Date End Date Taking? Authorizing Provider  amLODipine  (NORVASC ) 5 MG tablet TAKE 1 TABLET DAILY 05/27/23  Yes Orrie Lascano Jose, MD  Ascorbic Acid (VITAMIN C) 1000 MG tablet Take 1,000 mg by mouth daily.   Yes [provider]  Calcium -Phosphorus-Vitamin D  (CITRACAL +D3 PO) Take 1 tablet by mouth daily.   Yes [provider]  Anselm Oil 350 MG CAPS Take 350 mg by mouth daily.   Yes [provider]  levocetirizine (XYZAL ) 5 MG tablet TAKE 1 TABLET BY MOUTH EVERY DAY IN THE EVENING 01/19/23  Yes Sante Biedermann, Emil Schanz, MD  levothyroxine  (SYNTHROID ) 125 MCG tablet Take 1 tablet (125 mcg total) by mouth daily. 06/13/23  Yes Abiola Behring, Emil Schanz, MD  Multiple Vitamins-Minerals (MEGA MULTI MEN PO) Take 1 packet by mouth daily. GNC 50 PLUS    Yes [provider]  meloxicam  (MOBIC ) 7.5 MG tablet Take 1 tablet (7.5 mg total) by mouth daily as needed for pain. Patient not taking: Reported on 12/19/2023 10/19/22   Purcell Emil Schanz, MD  rosuvastatin  (CRESTOR ) 10 MG tablet TAKE 1 TABLET DAILY Patient not taking: Reported on 12/19/2023 06/11/23   Purcell Emil Schanz, MD    No Known Allergies  Patient Active Problem List   Diagnosis Date Noted   Cramp of limb 12/19/2023   Dyslipidemia 05/26/2021   Essential hypertension 07/24/2020   Environmental allergies 07/24/2020   Chronic cough 07/24/2020   Benign prostatic hyperplasia with urinary obstruction 11/14/2019   DDD  (degenerative disc disease), lumbar 11/20/2013   Family history of colon cancer 12/11/2011   Cancer of thyroid  (HCC) 12/11/2011   Hypothyroidism 08/16/2011   ED (erectile dysfunction) 08/16/2011    Past Medical History:  Diagnosis Date   Arthritis    Cancer (HCC)    thyroid     Eczema    Hypertension    Hypothyroidism    Seasonal allergies     Past Surgical History:  Procedure Laterality Date   ADENOIDECTOMY     ARTHRODESIS METATARSALPHALANGEAL JOINT (MTPJ) Left 05/26/2022   Procedure: ARTHRODESIS METATARSALPHALANGEAL JOINT (MTPJ), left 1st;  Surgeon: Barton Drape, MD;  Location: [facility];  Service: Orthopedics;  Laterality: Left;   HERNIA REPAIR     13 yrs ago   QUADRICEPS TENDON REPAIR Right 09/28/2019   Procedure: REPAIR QUADRICEP TENDON;  Surgeon: Gerome Charleston, MD;  Location: [facility];  Service: Orthopedics;  Laterality: Right;   rupture quad tenden     THYROIDECTOMY     4 or 5 yrs ago   TONSILLECTOMY      Social History   Socioeconomic History   Marital status: Married    Spouse name: Diane   Number of children: 2   Years of education: 16   Highest education level: Bachelor's degree (e.g., BA, AB, BS)  Occupational History   Occupation: RETIRED/rail roader  Tobacco Use   Smoking status: Former    Current packs/day: 0.00    Types: Cigarettes  Start date: 12/14/1973    Quit date: 12/15/1991    Years since quitting: 32.0   Smokeless tobacco: Never   Tobacco comments:    also smoked marijuana,and cocaine (free-base)   Vaping Use   Vaping status: Never Used  Substance and Sexual Activity   Alcohol use: Not Currently    Alcohol/week: 0.0 standard drinks of alcohol    Comment: hx of 30 years ago    Drug use: Not Currently    Comment: lst 30 years ago    Sexual activity: Not on file  Other Topics Concern   Not on file  Social History Narrative   Exercise cardio and weights 4 days/wk for 1 hour      Lives with wife/2025   Social  Drivers of Health   Financial Resource Strain: Low Risk  (12/15/2023)   Overall Financial Resource Strain (CARDIA)    Difficulty of Paying Living Expenses: Not hard at all  Food Insecurity: No Food Insecurity (12/15/2023)   Hunger Vital Sign    Worried About Running Out of Food in the Last Year: Never true    Ran Out of Food in the Last Year: Never true  Transportation Needs: No Transportation Needs (12/15/2023)   PRAPARE - Administrator, Civil Service (Medical): No    Lack of Transportation (Non-Medical): No  Physical Activity: Sufficiently Active (12/15/2023)   Exercise Vital Sign    Days of Exercise per Week: 5 days    Minutes of Exercise per Session: 90 min  Stress: No Stress Concern Present (12/15/2023)   [employer] of Occupational Health - Occupational Stress Questionnaire    Feeling of Stress: Not at all  Social Connections: Socially Integrated (12/15/2023)   Social Connection and Isolation Panel    Frequency of Communication with Friends and Family: More than three times a week    Frequency of Social Gatherings with Friends and Family: More than three times a week    Attends Religious Services: More than 4 times per year    Active Member of [employer] or Organizations: Yes    Attends Engineer, structural: More than 4 times per year    Marital Status: Married  Catering manager Violence: Not At Risk (07/07/2023)   Humiliation, Afraid, Rape, and Kick questionnaire    Fear of Current or Ex-Partner: No    Emotionally Abused: No    Physically Abused: No    Sexually Abused: No    Family History  Problem Relation Age of Onset   Cancer Mother        lung   Cancer Father        rectal   High Cholesterol Brother    Hypertension Brother    Stroke Brother    Kidney disease Maternal Grandmother        dialysis   Hypertension Maternal Grandmother    Hyperlipidemia Maternal Grandmother    Arthritis Maternal Grandfather    Allergic rhinitis Neg Hx    Asthma  Neg Hx    Eczema Neg Hx    Urticaria Neg Hx      Review of Systems  Constitutional: Negative.  Negative for chills and fever.  HENT: Negative.  Negative for congestion and sore throat.   Respiratory: Negative.  Negative for shortness of breath.   Cardiovascular: Negative.  Negative for chest pain and palpitations.  Gastrointestinal:  Negative for abdominal pain, diarrhea, nausea and vomiting.  Genitourinary: Negative.  Negative for dysuria and hematuria.  Musculoskeletal:  Intermittent cramping left lower extremity  Skin: Negative.  Negative for rash.  Neurological: Negative.  Negative for dizziness and headaches.  All other systems reviewed and are negative.   Vitals:   12/19/23 0900  BP: 124/78  Pulse: (!) 54  Temp: 97.7 F (36.5 C)  SpO2: 96%    Physical Exam Vitals reviewed.  Constitutional:      Appearance: Normal appearance.  HENT:     Head: Normocephalic.     Mouth/Throat:     Mouth: Mucous membranes are moist.     Pharynx: Oropharynx is clear.  Eyes:     Extraocular Movements: Extraocular movements intact.     Pupils: Pupils are equal, round, and reactive to light.  Cardiovascular:     Rate and Rhythm: Normal rate and regular rhythm.     Pulses: Normal pulses.     Heart sounds: Normal heart sounds.  Pulmonary:     Effort: Pulmonary effort is normal.     Breath sounds: Normal breath sounds.  Abdominal:     Palpations: Abdomen is soft.     Tenderness: There is no abdominal tenderness.  Musculoskeletal:     Cervical back: No tenderness.  Lymphadenopathy:     Cervical: No cervical adenopathy.  Skin:    General: Skin is warm and dry.     Capillary Refill: Capillary refill takes less than 2 seconds.  Neurological:     General: No focal deficit present.     Mental Status: He is alert and oriented to person, place, and time.  Psychiatric:        Mood and Affect: Mood normal.        Behavior: Behavior normal.      ASSESSMENT & PLAN: A total of  42 minutes was spent with the patient and counseling/coordination of care regarding preparing for this visit, review of most recent office visit notes, review of multiple chronic medical conditions and their management, review of all medications, review of most recent bloodwork results, review of health maintenance items, education on nutrition, prognosis, documentation, and need for follow up.   Problem List Items Addressed This Visit       Cardiovascular and Mediastinum   Essential hypertension - Primary   BP Readings from Last 3 Encounters:  12/19/23 124/78  06/13/23 130/78  01/25/23 124/76  Well-controlled hypertension off medications. Cardiovascular risks associated with hypertension discussed Diet and nutrition discussed       Relevant Orders   Comprehensive metabolic panel with GFR   CBC with Differential/Platelet     Endocrine   Hypothyroidism   Follicular carcinoma (Hurthle cell characteristics noted on right thyroid  lobectomy performed in May 2007). Completion of thyroidectomy performed August 2007 with reimplantation of left inferior parathyroid gland. Now on chronic thyroid  hormone replacement. Clinically euthyroid TSH done today Continue Synthroid  125 mcg daily Will adjust depending on TSH results      Relevant Orders   TSH   Cancer of thyroid  (HCC)   Follicular carcinoma diagnosed in 2007; pt underwent total thyroidectomy in 2 procedures.  Presently on Synthroid  125 mcg daily Lab Results  Component Value Date   TSH 0.11 (L) 06/13/2023         Relevant Orders   TSH     Other   Dyslipidemia   Chronic stable condition Lipid profile done today. Not taking rosuvastatin  Diet and nutrition discussed      Relevant Orders   Comprehensive metabolic panel with GFR   Hemoglobin A1c   Lipid panel  Cramp of limb   Mostly left lower extremity, thigh area Unremarkable exam.  No signs of circulatory compromise or chronic venous insufficiency Patient is very  physically active Advised to stay well-hydrated Supplementing with liquid IV.  Recommend to continue Blood work today      Relevant Orders   Magnesium   Comprehensive metabolic panel with GFR   CBC with Differential/Platelet   Other Visit Diagnoses       Need for vaccination       Relevant Orders   Flu vaccine HIGH DOSE PF(Fluzone Trivalent)      Patient Instructions  Health Maintenance After Age 96 After age 60, you are at a higher risk for certain long-term diseases and infections as well as injuries from falls. Falls are a major cause of broken bones and head injuries in people who are older than age 23. Getting regular preventive care can help to keep you healthy and well. Preventive care includes getting regular testing and making lifestyle changes as recommended by your health care provider. Talk with your health care provider about: Which screenings and tests you should have. A screening is a test that checks for a disease when you have no symptoms. A diet and exercise plan that is right for you. What should I know about screenings and tests to prevent falls? Screening and testing are the best ways to find a health problem early. Early diagnosis and treatment give you the best chance of managing medical conditions that are common after age 61. Certain conditions and lifestyle choices may make you more likely to have a fall. Your health care provider may recommend: Regular vision checks. Poor vision and conditions such as cataracts can make you more likely to have a fall. If you wear glasses, make sure to get your prescription updated if your vision changes. Medicine review. Work with your health care provider to regularly review all of the medicines you are taking, including over-the-counter medicines. Ask your health care provider about any side effects that may make you more likely to have a fall. Tell your health care provider if any medicines that you take make you feel dizzy or  sleepy. Strength and balance checks. Your health care provider may recommend certain tests to check your strength and balance while standing, walking, or changing positions. Foot health exam. Foot pain and numbness, as well as not wearing proper footwear, can make you more likely to have a fall. Screenings, including: Osteoporosis screening. Osteoporosis is a condition that causes the bones to get weaker and break more easily. Blood pressure screening. Blood pressure changes and medicines to control blood pressure can make you feel dizzy. Depression screening. You may be more likely to have a fall if you have a fear of falling, feel depressed, or feel unable to do activities that you used to do. Alcohol use screening. Using too much alcohol can affect your balance and may make you more likely to have a fall. Follow these instructions at home: Lifestyle Do not drink alcohol if: Your health care provider tells you not to drink. If you drink alcohol: Limit how much you have to: 0-1 drink a day for women. 0-2 drinks a day for men. Know how much alcohol is in your drink. In the [place] one drink equals one 12 oz bottle of beer (355 mL), one 5 oz glass of wine (148 mL), or one 1 oz glass of hard liquor (44 mL). Do not use any products that contain nicotine or tobacco. These  products include cigarettes, chewing tobacco, and vaping devices, such as e-cigarettes. If you need help quitting, ask your health care provider. Activity  Follow a regular exercise program to stay fit. This will help you maintain your balance. Ask your health care provider what types of exercise are appropriate for you. If you need a cane or walker, use it as recommended by your health care provider. Wear supportive shoes that have nonskid soles. Safety  Remove any tripping hazards, such as rugs, cords, and clutter. Install safety equipment such as grab bars in bathrooms and safety rails on stairs. Keep rooms and walkways  well-lit. General instructions Talk with your health care provider about your risks for falling. Tell your health care provider if: You fall. Be sure to tell your health care provider about all falls, even ones that seem minor. You feel dizzy, tiredness (fatigue), or off-balance. Take over-the-counter and prescription medicines only as told by your health care provider. These include supplements. Eat a healthy diet and maintain a healthy weight. A healthy diet includes low-fat dairy products, low-fat (lean) meats, and fiber from whole grains, beans, and lots of fruits and vegetables. Stay current with your vaccines. Schedule regular health, dental, and eye exams. Summary Having a healthy lifestyle and getting preventive care can help to protect your health and wellness after age 29. Screening and testing are the best way to find a health problem early and help you avoid having a fall. Early diagnosis and treatment give you the best chance for managing medical conditions that are more common for people who are older than age 44. Falls are a major cause of broken bones and head injuries in people who are older than age 60. Take precautions to prevent a fall at home. Work with your health care provider to learn what changes you can make to improve your health and wellness and to prevent falls. This information is not intended to replace advice given to you by your health care provider. Make sure you discuss any questions you have with your health care provider. Document Revised: 07/21/2020 Document Reviewed: 07/21/2020 Elsevier Patient Education  2024 Elsevier Inc.    Emil Schaumann, MD [facility]

## 2023-12-19 NOTE — Assessment & Plan Note (Addendum)
 Follicular carcinoma diagnosed in 2007; pt underwent total thyroidectomy in 2 procedures.  Presently on Synthroid  125 mcg daily Lab Results  Component Value Date   TSH 0.11 (L) 06/13/2023

## 2023-12-19 NOTE — Assessment & Plan Note (Signed)
 BP Readings from Last 3 Encounters:  12/19/23 124/78  06/13/23 130/78  01/25/23 124/76  Well-controlled hypertension off medications. Cardiovascular risks associated with hypertension discussed Diet and nutrition discussed

## 2023-12-19 NOTE — Patient Instructions (Signed)
 Health Maintenance After Age 70 After age 27, you are at a higher risk for certain long-term diseases and infections as well as injuries from falls. Falls are a major cause of broken bones and head injuries in people who are older than age 73. Getting regular preventive care can help to keep you healthy and well. Preventive care includes getting regular testing and making lifestyle changes as recommended by your health care provider. Talk with your health care provider about: Which screenings and tests you should have. A screening is a test that checks for a disease when you have no symptoms. A diet and exercise plan that is right for you. What should I know about screenings and tests to prevent falls? Screening and testing are the best ways to find a health problem early. Early diagnosis and treatment give you the best chance of managing medical conditions that are common after age 90. Certain conditions and lifestyle choices may make you more likely to have a fall. Your health care provider may recommend: Regular vision checks. Poor vision and conditions such as cataracts can make you more likely to have a fall. If you wear glasses, make sure to get your prescription updated if your vision changes. Medicine review. Work with your health care provider to regularly review all of the medicines you are taking, including over-the-counter medicines. Ask your health care provider about any side effects that may make you more likely to have a fall. Tell your health care provider if any medicines that you take make you feel dizzy or sleepy. Strength and balance checks. Your health care provider may recommend certain tests to check your strength and balance while standing, walking, or changing positions. Foot health exam. Foot pain and numbness, as well as not wearing proper footwear, can make you more likely to have a fall. Screenings, including: Osteoporosis screening. Osteoporosis is a condition that causes  the bones to get weaker and break more easily. Blood pressure screening. Blood pressure changes and medicines to control blood pressure can make you feel dizzy. Depression screening. You may be more likely to have a fall if you have a fear of falling, feel depressed, or feel unable to do activities that you used to do. Alcohol  use screening. Using too much alcohol  can affect your balance and may make you more likely to have a fall. Follow these instructions at home: Lifestyle Do not drink alcohol  if: Your health care provider tells you not to drink. If you drink alcohol : Limit how much you have to: 0-1 drink a day for women. 0-2 drinks a day for men. Know how much alcohol  is in your drink. In the [place] one drink equals one 12 oz bottle of beer (355 mL), one 5 oz glass of wine (148 mL), or one 1 oz glass of hard liquor (44 mL). Do not use any products that contain nicotine or tobacco. These products include cigarettes, chewing tobacco, and vaping devices, such as e-cigarettes. If you need help quitting, ask your health care provider. Activity  Follow a regular exercise program to stay fit. This will help you maintain your balance. Ask your health care provider what types of exercise are appropriate for you. If you need a cane or walker, use it as recommended by your health care provider. Wear supportive shoes that have nonskid soles. Safety  Remove any tripping hazards, such as rugs, cords, and clutter. Install safety equipment such as grab bars in bathrooms and safety rails on stairs. Keep rooms and walkways  well-lit. General instructions Talk with your health care provider about your risks for falling. Tell your health care provider if: You fall. Be sure to tell your health care provider about all falls, even ones that seem minor. You feel dizzy, tiredness (fatigue), or off-balance. Take over-the-counter and prescription medicines only as told by your health care provider. These include  supplements. Eat a healthy diet and maintain a healthy weight. A healthy diet includes low-fat dairy products, low-fat (lean) meats, and fiber from whole grains, beans, and lots of fruits and vegetables. Stay current with your vaccines. Schedule regular health, dental, and eye exams. Summary Having a healthy lifestyle and getting preventive care can help to protect your health and wellness after age 15. Screening and testing are the best way to find a health problem early and help you avoid having a fall. Early diagnosis and treatment give you the best chance for managing medical conditions that are more common for people who are older than age 42. Falls are a major cause of broken bones and head injuries in people who are older than age 64. Take precautions to prevent a fall at home. Work with your health care provider to learn what changes you can make to improve your health and wellness and to prevent falls. This information is not intended to replace advice given to you by your health care provider. Make sure you discuss any questions you have with your health care provider. Document Revised: 07/21/2020 Document Reviewed: 07/21/2020 Elsevier Patient Education  2024 [employer].

## 2023-12-19 NOTE — Assessment & Plan Note (Signed)
 Mostly left lower extremity, thigh area Unremarkable exam.  No signs of circulatory compromise or chronic venous insufficiency Patient is very physically active Advised to stay well-hydrated Supplementing with liquid IV.  Recommend to continue Blood work today

## 2024-01-02 ENCOUNTER — Other Ambulatory Visit: Payer: Self-pay | Admitting: Emergency Medicine

## 2024-04-04 ENCOUNTER — Telehealth: Payer: Self-pay

## 2024-04-04 NOTE — Telephone Encounter (Signed)
 Copied from CRM #[id number]. Topic: General - Billing Inquiry >> Apr 04, 2024 11:21 AM Macario HERO wrote: Reason for CRM: Elma from [facility] - Calling to see if patient was advised by us  that his visit on 12/19/23  would be covered by his insurance because it was not a covered appointment and they need clarity that he was notified prior. - No call back number but will call back to check the status. -- Provided number for billing.

## 2024-04-05 NOTE — Telephone Encounter (Signed)
 How can we send this to billing? Im not sure if I can answer

## 2024-06-19 ENCOUNTER — Encounter: Admitting: Emergency Medicine

## 2024-07-09 ENCOUNTER — Ambulatory Visit
